# Patient Record
Sex: Male | Born: 1995 | Race: White | Hispanic: No | Marital: Single | Smoking: Current every day smoker
Health system: Southern US, Community
[De-identification: ages and names within clinical notes are randomized; demographics above are authoritative.]

## PROBLEM LIST (undated history)

## (undated) DIAGNOSIS — F1910 Other psychoactive substance abuse, uncomplicated: Secondary | ICD-10-CM

---

## 2016-07-09 ENCOUNTER — Inpatient Hospital Stay (HOSPITAL_COMMUNITY): Payer: BLUE CROSS/BLUE SHIELD

## 2016-07-09 ENCOUNTER — Encounter (HOSPITAL_COMMUNITY): Payer: Self-pay

## 2016-07-09 ENCOUNTER — Inpatient Hospital Stay (HOSPITAL_COMMUNITY)
Admission: AD | Admit: 2016-07-09 | Discharge: 2016-08-06 | DRG: 917 | Disposition: A | Discharge status: Home / Self Care | Payer: BLUE CROSS/BLUE SHIELD | Source: Other Acute Inpatient Hospital | Attending: Family Medicine | Admitting: Family Medicine

## 2016-07-09 DIAGNOSIS — M6282 Rhabdomyolysis: Secondary | ICD-10-CM

## 2016-07-09 DIAGNOSIS — J690 Pneumonitis due to inhalation of food and vomit: Secondary | ICD-10-CM | POA: Diagnosis present

## 2016-07-09 DIAGNOSIS — X58XXXA Exposure to other specified factors, initial encounter: Secondary | ICD-10-CM | POA: Diagnosis not present

## 2016-07-09 DIAGNOSIS — T404X1A Poisoning by other synthetic narcotics, accidental (unintentional), initial encounter: Secondary | ICD-10-CM | POA: Diagnosis present

## 2016-07-09 DIAGNOSIS — S3730XA Unspecified injury of urethra, initial encounter: Secondary | ICD-10-CM | POA: Diagnosis not present

## 2016-07-09 DIAGNOSIS — J9601 Acute respiratory failure with hypoxia: Secondary | ICD-10-CM | POA: Diagnosis present

## 2016-07-09 DIAGNOSIS — I959 Hypotension, unspecified: Secondary | ICD-10-CM | POA: Diagnosis not present

## 2016-07-09 DIAGNOSIS — T404X2A Poisoning by other synthetic narcotics, intentional self-harm, initial encounter: Secondary | ICD-10-CM | POA: Diagnosis present

## 2016-07-09 DIAGNOSIS — T40601A Poisoning by unspecified narcotics, accidental (unintentional), initial encounter: Secondary | ICD-10-CM | POA: Diagnosis present

## 2016-07-09 DIAGNOSIS — D649 Anemia, unspecified: Secondary | ICD-10-CM | POA: Diagnosis present

## 2016-07-09 DIAGNOSIS — N368 Other specified disorders of urethra: Secondary | ICD-10-CM | POA: Diagnosis not present

## 2016-07-09 DIAGNOSIS — T50904S Poisoning by unspecified drugs, medicaments and biological substances, undetermined, sequela: Secondary | ICD-10-CM | POA: Diagnosis not present

## 2016-07-09 DIAGNOSIS — N170 Acute kidney failure with tubular necrosis: Secondary | ICD-10-CM | POA: Diagnosis present

## 2016-07-09 DIAGNOSIS — T1491XA Suicide attempt, initial encounter: Secondary | ICD-10-CM | POA: Diagnosis not present

## 2016-07-09 DIAGNOSIS — J9600 Acute respiratory failure, unspecified whether with hypoxia or hypercapnia: Secondary | ICD-10-CM | POA: Diagnosis not present

## 2016-07-09 DIAGNOSIS — E162 Hypoglycemia, unspecified: Secondary | ICD-10-CM | POA: Diagnosis present

## 2016-07-09 DIAGNOSIS — I420 Dilated cardiomyopathy: Secondary | ICD-10-CM | POA: Diagnosis present

## 2016-07-09 DIAGNOSIS — I361 Nonrheumatic tricuspid (valve) insufficiency: Secondary | ICD-10-CM | POA: Diagnosis not present

## 2016-07-09 DIAGNOSIS — E875 Hyperkalemia: Secondary | ICD-10-CM | POA: Diagnosis present

## 2016-07-09 DIAGNOSIS — R310 Gross hematuria: Secondary | ICD-10-CM | POA: Diagnosis present

## 2016-07-09 DIAGNOSIS — T8249XA Other complication of vascular dialysis catheter, initial encounter: Secondary | ICD-10-CM

## 2016-07-09 DIAGNOSIS — T401X1A Poisoning by heroin, accidental (unintentional), initial encounter: Secondary | ICD-10-CM | POA: Diagnosis present

## 2016-07-09 DIAGNOSIS — R740 Nonspecific elevation of levels of transaminase and lactic acid dehydrogenase [LDH]: Secondary | ICD-10-CM | POA: Diagnosis present

## 2016-07-09 DIAGNOSIS — T50901A Poisoning by unspecified drugs, medicaments and biological substances, accidental (unintentional), initial encounter: Secondary | ICD-10-CM | POA: Diagnosis present

## 2016-07-09 DIAGNOSIS — K7200 Acute and subacute hepatic failure without coma: Secondary | ICD-10-CM | POA: Diagnosis present

## 2016-07-09 DIAGNOSIS — N179 Acute kidney failure, unspecified: Secondary | ICD-10-CM

## 2016-07-09 DIAGNOSIS — F11221 Opioid dependence with intoxication delirium: Secondary | ICD-10-CM | POA: Diagnosis not present

## 2016-07-09 DIAGNOSIS — R748 Abnormal levels of other serum enzymes: Secondary | ICD-10-CM | POA: Diagnosis not present

## 2016-07-09 DIAGNOSIS — E669 Obesity, unspecified: Secondary | ICD-10-CM | POA: Diagnosis present

## 2016-07-09 DIAGNOSIS — G9340 Encephalopathy, unspecified: Secondary | ICD-10-CM | POA: Diagnosis present

## 2016-07-09 DIAGNOSIS — D696 Thrombocytopenia, unspecified: Secondary | ICD-10-CM | POA: Diagnosis present

## 2016-07-09 DIAGNOSIS — E872 Acidosis: Secondary | ICD-10-CM | POA: Diagnosis present

## 2016-07-09 DIAGNOSIS — N3289 Other specified disorders of bladder: Secondary | ICD-10-CM | POA: Diagnosis not present

## 2016-07-09 DIAGNOSIS — E861 Hypovolemia: Secondary | ICD-10-CM | POA: Diagnosis present

## 2016-07-09 DIAGNOSIS — T50901D Poisoning by unspecified drugs, medicaments and biological substances, accidental (unintentional), subsequent encounter: Secondary | ICD-10-CM | POA: Diagnosis not present

## 2016-07-09 DIAGNOSIS — T50902A Poisoning by unspecified drugs, medicaments and biological substances, intentional self-harm, initial encounter: Secondary | ICD-10-CM | POA: Diagnosis not present

## 2016-07-09 DIAGNOSIS — T50904A Poisoning by unspecified drugs, medicaments and biological substances, undetermined, initial encounter: Secondary | ICD-10-CM

## 2016-07-09 DIAGNOSIS — Z452 Encounter for adjustment and management of vascular access device: Secondary | ICD-10-CM

## 2016-07-09 DIAGNOSIS — I248 Other forms of acute ischemic heart disease: Secondary | ICD-10-CM | POA: Diagnosis present

## 2016-07-09 DIAGNOSIS — Z4659 Encounter for fitting and adjustment of other gastrointestinal appliance and device: Secondary | ICD-10-CM

## 2016-07-09 DIAGNOSIS — J9690 Respiratory failure, unspecified, unspecified whether with hypoxia or hypercapnia: Secondary | ICD-10-CM

## 2016-07-09 DIAGNOSIS — I519 Heart disease, unspecified: Secondary | ICD-10-CM | POA: Diagnosis not present

## 2016-07-09 DIAGNOSIS — D62 Acute posthemorrhagic anemia: Secondary | ICD-10-CM | POA: Diagnosis not present

## 2016-07-09 DIAGNOSIS — I5030 Unspecified diastolic (congestive) heart failure: Secondary | ICD-10-CM | POA: Diagnosis not present

## 2016-07-09 DIAGNOSIS — F17210 Nicotine dependence, cigarettes, uncomplicated: Secondary | ICD-10-CM | POA: Diagnosis not present

## 2016-07-09 HISTORY — DX: Other psychoactive substance abuse, uncomplicated: F19.10

## 2016-07-09 LAB — GLUCOSE, CAPILLARY
GLUCOSE-CAPILLARY: 159 mg/dL — AB (ref 65–99)
Glucose-Capillary: 103 mg/dL — ABNORMAL HIGH (ref 65–99)
Glucose-Capillary: 127 mg/dL — ABNORMAL HIGH (ref 65–99)
Glucose-Capillary: 48 mg/dL — ABNORMAL LOW (ref 65–99)
Glucose-Capillary: 67 mg/dL (ref 65–99)

## 2016-07-09 LAB — BLOOD GAS, ARTERIAL
Acid-base deficit: 4.7 mmol/L — ABNORMAL HIGH (ref 0.0–2.0)
Bicarbonate: 20.8 mmol/L (ref 20.0–28.0)
DRAWN BY: 235881
FIO2: 60
O2 Saturation: 97.8 %
PEEP: 5 cmH2O
Patient temperature: 99.1
RATE: 18 resp/min
VT: 620 mL
pCO2 arterial: 45.1 mmHg (ref 32.0–48.0)
pH, Arterial: 7.287 — ABNORMAL LOW (ref 7.350–7.450)
pO2, Arterial: 132 mmHg — ABNORMAL HIGH (ref 83.0–108.0)

## 2016-07-09 LAB — COMPREHENSIVE METABOLIC PANEL
ALT: 4003 U/L — ABNORMAL HIGH (ref 17–63)
ANION GAP: 10 (ref 5–15)
AST: 5359 U/L — ABNORMAL HIGH (ref 15–41)
Albumin: 3.4 g/dL — ABNORMAL LOW (ref 3.5–5.0)
Alkaline Phosphatase: 62 U/L (ref 38–126)
BUN: 28 mg/dL — ABNORMAL HIGH (ref 6–20)
CO2: 22 mmol/L (ref 22–32)
Calcium: 7.5 mg/dL — ABNORMAL LOW (ref 8.9–10.3)
Chloride: 105 mmol/L (ref 101–111)
Creatinine, Ser: 4.27 mg/dL — ABNORMAL HIGH (ref 0.61–1.24)
GFR calc non Af Amer: 19 mL/min — ABNORMAL LOW (ref >60)
GFR, EST AFRICAN AMERICAN: 21 mL/min — AB (ref >60)
Glucose, Bld: 154 mg/dL — ABNORMAL HIGH (ref 65–99)
POTASSIUM: 6.8 mmol/L — AB (ref 3.5–5.1)
SODIUM: 137 mmol/L (ref 135–145)
TOTAL PROTEIN: 6.6 g/dL (ref 6.5–8.1)
Total Bilirubin: 1.2 mg/dL (ref 0.3–1.2)

## 2016-07-09 LAB — ECHOCARDIOGRAM COMPLETE
AOPV: 0.71 m/s
AV Area VTI: 2.47 cm2
AV Peak grad: 3 mmHg
AVPKVEL: 92.7 cm/s
Area-P 1/2: 3.33 cm2
CHL CUP AV PEAK INDEX: 1.07
CHL CUP DOP CALC LVOT VTI: 10 cm
E decel time: 225 msec
E/e' ratio: 5.96
FS: 17 % — AB (ref 28–44)
Height: 72 in
IVS/LV PW RATIO, ED: 1.23
LA diam end sys: 31 mm
LA diam index: 1.34 cm/m2
LA vol A4C: 20.3 ml
LASIZE: 31 mm
LDCA: 3.46 cm2
LV PW d: 9.72 mm — AB (ref 0.6–1.1)
LV SIMPSON'S DISK: 44
LV dias vol: 128 mL (ref 62–150)
LVDIAVOLIN: 55 mL/m2
LVEEAVG: 5.96
LVEEMED: 5.96
LVELAT: 9.25 cm/s
LVOT diameter: 21 mm
LVOTPV: 66.1 cm/s
LVOTSV: 35 mL
LVSYSVOL: 72 mL — AB (ref 21–61)
LVSYSVOLIN: 31 mL/m2
MV Dec: 225
MV pk A vel: 38.4 m/s
MV pk E vel: 55.1 m/s
P 1/2 time: 66 ms
RV LATERAL S' VELOCITY: 9.14 cm/s
RV TAPSE: 12.1 mm
RV sys press: 23 mmHg
Reg peak vel: 226 cm/s
Stroke v: 56 ml
TDI e' lateral: 9.25
TDI e' medial: 5.55
TRMAXVEL: 226 cm/s

## 2016-07-09 LAB — POCT ACTIVATED CLOTTING TIME
ACTIVATED CLOTTING TIME: 142 s
ACTIVATED CLOTTING TIME: 142 s
ACTIVATED CLOTTING TIME: 142 s
Activated Clotting Time: 120 seconds
Activated Clotting Time: 120 seconds

## 2016-07-09 LAB — PHOSPHORUS: Phosphorus: 3.9 mg/dL (ref 2.5–4.6)

## 2016-07-09 LAB — TROPONIN I
TROPONIN I: 16.84 ng/mL — AB (ref <0.03)
TROPONIN I: 24.1 ng/mL — AB (ref <0.03)

## 2016-07-09 LAB — CBC WITH DIFFERENTIAL/PLATELET
BASOS ABS: 0 10*3/uL (ref 0.0–0.1)
Basophils Relative: 0 %
EOS PCT: 0 %
Eosinophils Absolute: 0 10*3/uL (ref 0.0–0.7)
HEMATOCRIT: 46.7 % (ref 39.0–52.0)
Hemoglobin: 15.7 g/dL (ref 13.0–17.0)
LYMPHS PCT: 5 %
Lymphs Abs: 0.6 10*3/uL — ABNORMAL LOW (ref 0.7–4.0)
MCH: 30.1 pg (ref 26.0–34.0)
MCHC: 33.6 g/dL (ref 30.0–36.0)
MCV: 89.5 fL (ref 78.0–100.0)
MONO ABS: 0.7 10*3/uL (ref 0.1–1.0)
MONOS PCT: 5 %
Neutro Abs: 12.3 10*3/uL — ABNORMAL HIGH (ref 1.7–7.7)
Neutrophils Relative %: 90 %
PLATELETS: 152 10*3/uL (ref 150–400)
RBC: 5.22 MIL/uL (ref 4.22–5.81)
RDW: 14.1 % (ref 11.5–15.5)
WBC: 13.6 10*3/uL — ABNORMAL HIGH (ref 4.0–10.5)

## 2016-07-09 LAB — LACTIC ACID, PLASMA
Lactic Acid, Venous: 2.2 mmol/L (ref 0.5–1.9)
Lactic Acid, Venous: 1.8 mmol/L (ref 0.5–1.9)

## 2016-07-09 LAB — CK: Total CK: >50000 U/L — ABNORMAL HIGH (ref 49–397)

## 2016-07-09 LAB — RENAL FUNCTION PANEL
ALBUMIN: 3.3 g/dL — AB (ref 3.5–5.0)
Anion gap: 11 (ref 5–15)
BUN: 30 mg/dL — ABNORMAL HIGH (ref 6–20)
CALCIUM: 7.7 mg/dL — AB (ref 8.9–10.3)
CO2: 22 mmol/L (ref 22–32)
CREATININE: 4.56 mg/dL — AB (ref 0.61–1.24)
Chloride: 106 mmol/L (ref 101–111)
GFR, EST AFRICAN AMERICAN: 20 mL/min — AB (ref >60)
GFR, EST NON AFRICAN AMERICAN: 17 mL/min — AB (ref >60)
Glucose, Bld: 135 mg/dL — ABNORMAL HIGH (ref 65–99)
PHOSPHORUS: 4.5 mg/dL (ref 2.5–4.6)
Potassium: 4.4 mmol/L (ref 3.5–5.1)
SODIUM: 139 mmol/L (ref 135–145)

## 2016-07-09 LAB — MAGNESIUM: MAGNESIUM: 2.1 mg/dL (ref 1.7–2.4)

## 2016-07-09 LAB — MRSA PCR SCREENING: MRSA BY PCR: NEGATIVE

## 2016-07-09 LAB — HIV ANTIBODY (ROUTINE TESTING W REFLEX): HIV Screen 4th Generation wRfx: NONREACTIVE

## 2016-07-09 LAB — PROCALCITONIN: PROCALCITONIN: 41.54 ng/mL

## 2016-07-09 LAB — TRIGLYCERIDES: Triglycerides: 149 mg/dL (ref <150)

## 2016-07-09 MED ORDER — HEPARIN BOLUS VIA INFUSION (CRRT)
1000.0000 [IU] | INTRAVENOUS | Status: DC | PRN
  Administered 2016-07-09 (×8): 1000 [IU] via INTRAVENOUS_CENTRAL
  Filled 2016-07-09 (×2): qty 1000

## 2016-07-09 MED ORDER — DEXTROSE 50 % IV SOLN
50.0000 mL | Freq: Once | INTRAVENOUS | Status: AC
  Administered 2016-07-09: 50 mL via INTRAVENOUS
  Filled 2016-07-09: qty 50

## 2016-07-09 MED ORDER — PRISMASOL BGK 0/2.5 32-2.5 MEQ/L IV SOLN
INTRAVENOUS | Status: DC
  Administered 2016-07-09 (×2): via INTRAVENOUS_CENTRAL
  Filled 2016-07-09 (×7): qty 5000

## 2016-07-09 MED ORDER — SODIUM POLYSTYRENE SULFONATE 15 GM/60ML PO SUSP
30.0000 g | Freq: Once | ORAL | Status: AC
  Administered 2016-07-09: 30 g
  Filled 2016-07-09: qty 120

## 2016-07-09 MED ORDER — HEPARIN SODIUM (PORCINE) 5000 UNIT/ML IJ SOLN
5000.0000 [IU] | Freq: Three times a day (TID) | INTRAMUSCULAR | Status: DC
  Administered 2016-07-09 – 2016-07-20 (×33): 5000 [IU] via SUBCUTANEOUS
  Filled 2016-07-09 (×35): qty 1

## 2016-07-09 MED ORDER — SODIUM CHLORIDE 0.9% FLUSH: 10.0000 mL | INTRAVENOUS | Status: DC | PRN

## 2016-07-09 MED ORDER — PANTOPRAZOLE SODIUM 40 MG IV SOLR
40.0000 mg | Freq: Every day | INTRAVENOUS | Status: DC
  Administered 2016-07-09 – 2016-07-11 (×3): 40 mg via INTRAVENOUS
  Filled 2016-07-09 (×3): qty 40

## 2016-07-09 MED ORDER — ORAL CARE MOUTH RINSE
15.0000 mL | OROMUCOSAL | Status: DC
  Administered 2016-07-09 – 2016-07-12 (×35): 15 mL via OROMUCOSAL

## 2016-07-09 MED ORDER — SODIUM CHLORIDE 0.9% FLUSH
10.0000 mL | Freq: Two times a day (BID) | INTRAVENOUS | Status: DC
  Administered 2016-07-09: 10 mL
  Administered 2016-07-10: 20 mL
  Administered 2016-07-10: 10 mL
  Administered 2016-07-11: 20 mL
  Administered 2016-07-12: 10 mL
  Administered 2016-07-13: 40 mL
  Administered 2016-07-14 – 2016-07-25 (×14): 10 mL
  Administered 2016-07-26: 40 mL
  Administered 2016-07-26 – 2016-07-30 (×4): 10 mL

## 2016-07-09 MED ORDER — FENTANYL CITRATE (PF) 100 MCG/2ML IJ SOLN
100.0000 ug | INTRAMUSCULAR | Status: AC | PRN
  Administered 2016-07-10 – 2016-07-11 (×3): 100 ug via INTRAVENOUS
  Filled 2016-07-09 (×3): qty 2

## 2016-07-09 MED ORDER — CHLORHEXIDINE GLUCONATE 0.12% ORAL RINSE (MEDLINE KIT)
15.0000 mL | Freq: Two times a day (BID) | OROMUCOSAL | Status: DC
Start: 2016-07-09 — End: 2016-07-12
  Administered 2016-07-09 – 2016-07-12 (×8): 15 mL via OROMUCOSAL

## 2016-07-09 MED ORDER — HEPARIN (PORCINE) 2000 UNITS/L FOR CRRT
INTRAVENOUS_CENTRAL | Status: DC | PRN
  Administered 2016-07-09: 12:00:00 via INTRAVENOUS_CENTRAL
  Administered 2016-07-10: 999 mL via INTRAVENOUS_CENTRAL
  Administered 2016-07-13 – 2016-07-14 (×2): via INTRAVENOUS_CENTRAL
  Filled 2016-07-09: qty 1000

## 2016-07-09 MED ORDER — SODIUM CHLORIDE 0.9 % IV SOLN
1.0000 g | Freq: Once | INTRAVENOUS | Status: AC
  Administered 2016-07-09: 1 g via INTRAVENOUS
  Filled 2016-07-09: qty 10

## 2016-07-09 MED ORDER — SODIUM CHLORIDE 0.9 % IV SOLN
250.0000 mL | INTRAVENOUS | Status: DC | PRN
  Administered 2016-07-09 – 2016-07-12 (×3): 250 mL via INTRAVENOUS

## 2016-07-09 MED ORDER — CHLORHEXIDINE GLUCONATE CLOTH 2 % EX PADS
6.0000 | MEDICATED_PAD | Freq: Every day | CUTANEOUS | Status: DC
  Administered 2016-07-09 – 2016-07-21 (×12): 6 via TOPICAL

## 2016-07-09 MED ORDER — INSULIN ASPART 100 UNIT/ML ~~LOC~~ SOLN
10.0000 [IU] | Freq: Once | SUBCUTANEOUS | Status: AC
  Administered 2016-07-09: 10 [IU] via INTRAVENOUS

## 2016-07-09 MED ORDER — DEXTROSE 50 % IV SOLN
INTRAVENOUS | Status: AC
  Administered 2016-07-09: 50 mL
  Filled 2016-07-09: qty 50

## 2016-07-09 MED ORDER — HEPARIN SODIUM (PORCINE) 1000 UNIT/ML DIALYSIS
1000.0000 [IU] | INTRAMUSCULAR | Status: DC | PRN
  Administered 2016-07-13 – 2016-07-16 (×2): 2800 [IU] via INTRAVENOUS_CENTRAL
  Filled 2016-07-09 (×4): qty 6
  Filled 2016-07-09 (×2): qty 3

## 2016-07-09 MED ORDER — PRISMASOL BGK 4/2.5 32-4-2.5 MEQ/L IV SOLN
INTRAVENOUS | Status: DC
  Administered 2016-07-09 – 2016-07-16 (×13): via INTRAVENOUS_CENTRAL
  Filled 2016-07-09 (×20): qty 5000

## 2016-07-09 MED ORDER — FENTANYL CITRATE (PF) 100 MCG/2ML IJ SOLN
100.0000 ug | INTRAMUSCULAR | Status: DC | PRN
  Administered 2016-07-09 – 2016-07-12 (×8): 100 ug via INTRAVENOUS
  Filled 2016-07-09 (×8): qty 2

## 2016-07-09 MED ORDER — PRISMASOL BGK 4/2.5 32-4-2.5 MEQ/L IV SOLN
INTRAVENOUS | Status: DC
  Administered 2016-07-09 – 2016-07-16 (×52): via INTRAVENOUS_CENTRAL
  Filled 2016-07-09 (×66): qty 5000

## 2016-07-09 MED ORDER — SODIUM CHLORIDE 0.9 % IJ SOLN
250.0000 [IU]/h | INTRAMUSCULAR | Status: DC
  Administered 2016-07-09: 250 [IU]/h via INTRAVENOUS_CENTRAL
  Administered 2016-07-09: 1450 [IU]/h via INTRAVENOUS_CENTRAL
  Administered 2016-07-10: 2050 [IU]/h via INTRAVENOUS_CENTRAL
  Administered 2016-07-10: 2250 [IU]/h via INTRAVENOUS_CENTRAL
  Administered 2016-07-10 (×2): 2350 [IU]/h via INTRAVENOUS_CENTRAL
  Administered 2016-07-10: 2150 [IU]/h via INTRAVENOUS_CENTRAL
  Administered 2016-07-10: 1950 [IU]/h via INTRAVENOUS_CENTRAL
  Administered 2016-07-10: 2350 [IU]/h via INTRAVENOUS_CENTRAL
  Administered 2016-07-11 (×2): 1950 [IU]/h via INTRAVENOUS_CENTRAL
  Administered 2016-07-11: 1900 [IU]/h via INTRAVENOUS_CENTRAL
  Administered 2016-07-11: 1850 [IU]/h via INTRAVENOUS_CENTRAL
  Administered 2016-07-11: 1950 [IU]/h via INTRAVENOUS_CENTRAL
  Administered 2016-07-11: 1850 [IU]/h via INTRAVENOUS_CENTRAL
  Administered 2016-07-12 (×5): 2000 [IU]/h via INTRAVENOUS_CENTRAL
  Administered 2016-07-13: 1750 [IU]/h via INTRAVENOUS_CENTRAL
  Administered 2016-07-13: 1900 [IU]/h via INTRAVENOUS_CENTRAL
  Administered 2016-07-14 (×3): 1800 [IU]/h via INTRAVENOUS_CENTRAL
  Administered 2016-07-14: 1650 [IU]/h via INTRAVENOUS_CENTRAL
  Administered 2016-07-15: 1350 [IU]/h via INTRAVENOUS_CENTRAL
  Administered 2016-07-15: 1300 [IU]/h via INTRAVENOUS_CENTRAL
  Administered 2016-07-15: 1450 [IU]/h via INTRAVENOUS_CENTRAL
  Administered 2016-07-15: 1300 [IU]/h via INTRAVENOUS_CENTRAL
  Administered 2016-07-16 (×3): 1450 [IU]/h via INTRAVENOUS_CENTRAL
  Filled 2016-07-09 (×39): qty 2

## 2016-07-09 MED ORDER — DEXTROSE 50 % IV SOLN
1.0000 | Freq: Once | INTRAVENOUS | Status: AC
  Administered 2016-07-09: 50 mL via INTRAVENOUS
  Filled 2016-07-09: qty 50

## 2016-07-09 MED ORDER — DEXTROSE 50 % IV SOLN
INTRAVENOUS | Status: AC
  Filled 2016-07-09: qty 50

## 2016-07-09 MED ORDER — DEXTROSE 50 % IV SOLN
INTRAVENOUS | Status: AC
  Administered 2016-07-09: 50 mL via INTRAVENOUS
  Filled 2016-07-09: qty 50

## 2016-07-09 MED ORDER — PROPOFOL 1000 MG/100ML IV EMUL
0.0000 ug/kg/min | INTRAVENOUS | Status: DC
  Administered 2016-07-09 (×3): 30 ug/kg/min via INTRAVENOUS
  Administered 2016-07-10: 50 ug/kg/min via INTRAVENOUS
  Administered 2016-07-10: 20 ug/kg/min via INTRAVENOUS
  Administered 2016-07-10: 25 ug/kg/min via INTRAVENOUS
  Administered 2016-07-10: 45 ug/kg/min via INTRAVENOUS
  Administered 2016-07-10: 20 ug/kg/min via INTRAVENOUS
  Administered 2016-07-11 (×2): 30 ug/kg/min via INTRAVENOUS
  Administered 2016-07-11: 25.046 ug/kg/min via INTRAVENOUS
  Administered 2016-07-11: 40 ug/kg/min via INTRAVENOUS
  Administered 2016-07-11: 25.046 ug/kg/min via INTRAVENOUS
  Administered 2016-07-12: 40 ug/kg/min via INTRAVENOUS
  Administered 2016-07-12: 30.055 ug/kg/min via INTRAVENOUS
  Administered 2016-07-12: 20.036 ug/kg/min via INTRAVENOUS
  Filled 2016-07-09 (×18): qty 100

## 2016-07-09 MED ORDER — SODIUM CHLORIDE 0.9 % IV SOLN
INTRAVENOUS | Status: DC
  Administered 2016-07-09 – 2016-07-10 (×4): via INTRAVENOUS
  Administered 2016-07-10: 150 mL/h via INTRAVENOUS
  Administered 2016-07-11 – 2016-07-16 (×4): via INTRAVENOUS

## 2016-07-09 MED ORDER — PRISMASOL BGK 4/2.5 32-4-2.5 MEQ/L IV SOLN
INTRAVENOUS | Status: DC
  Administered 2016-07-09 – 2016-07-16 (×17): via INTRAVENOUS_CENTRAL
  Filled 2016-07-09 (×21): qty 5000

## 2016-07-09 MED ORDER — PROPOFOL 1000 MG/100ML IV EMUL
0.0000 ug/kg/min | INTRAVENOUS | Status: DC
  Administered 2016-07-09: 30 ug/kg/min via INTRAVENOUS

## 2016-07-09 NOTE — Progress Notes (Signed)
  Echocardiogram 2D Echocardiogram has been performed.  Blake Kramer 07/09/2016, 9:40 AM

## 2016-07-09 NOTE — Consult Note (Signed)
Cardiology Consult    Patient ID: Blake Kramer MRN: [id number], DOB/AGE: Jan 07, 1996   Admit date: 07/09/2016 Date of Consult: 07/09/2016  Primary Physician: No primary care provider on file. Primary Cardiologist: new - Dr. Percival Spanish Requesting Provider: Dr. Chase Caller  Reason for Consult: elevated troponin  Patient Profile    [name] has no listed PMH in EPIC. Cardiology was consulted for elevated troponin in this patient who was found unresponsive from likely opioid overdose.  Blake Kramer is a 21 y.o. male who is being seen today for the evaluation of elevated troponin at the request of Dr. Chase Caller.    Past Medical History   No past medical history on file.  No past surgical history on file.  (The patient's family does not report a significant past medical or surgical history.)  Allergies  Allergies  Allergen Reactions  . Penicillins     History of Present Illness    Pt is intubated and encephalopathic. Level 5 Caveat.  [name] is a 21 yo male who was found unresponsive, pale, and diaphoretic after a presumed drug overdose; thought to be down and unresponsive for 10-12 hrs.. His UDS was positive for opioids. In the ED, he was intubated for airway protection. Narcan x 3 given with little response. He was transferred to [facility] and found to have multiple metabolic disturbances (see Assessment and Plan), including elevated troponin. Cardiology was asked to consult for elevated troponin. Echo also revealed new LVEF of 25-30%.  Mother and sister at bedside report no medical issues other than substance abuse/addiction.   Inpatient Medications    . chlorhexidine gluconate (MEDLINE KIT)  15 mL Mouth Rinse BID  . Chlorhexidine Gluconate Cloth  6 each Topical Daily  . heparin  5,000 Units Subcutaneous Q8H  . mouth rinse  15 mL Mouth Rinse 10 times per day  . pantoprazole (PROTONIX) IV  40 mg Intravenous QHS  . sodium chloride flush  10-40 mL Intracatheter Q12H     Outpatient  Medications    Prior to Admission medications   Not on File     Family History    Level 5 Caveat for non-responsive patient.  No family history on file.  Social History    Social History   Social History  . Marital status: Single    Spouse name: N/A  . Number of children: N/A  . Years of education: N/A   Occupational History  . Not on file.   Social History Main Topics  . Smoking status: Not on file  . Smokeless tobacco: Not on file  . Alcohol use Not on file  . Drug use: Unknown  . Sexual activity: Not on file   Other Topics Concern  . Not on file   Social History Narrative  . No narrative on file     Review of Systems    Unable to obtain.  Patient is intubated and sedated.   Physical Exam    Blood pressure (!) 145/106, pulse 93, temperature 98.5 F (36.9 C), temperature source Oral, resp. rate 18, height 6' (1.829 m), weight 242 lb 1 oz (109.8 kg), SpO2 99 %.  General: intubated  Psych: unresponsive Neuro: unresponsive HEENT: Normal  Neck: Supple without bruits or JVD. Lungs:  Intubated, lung sounds bilaterally Heart: regular rate and rhythm, exam difficult 2/ breath sounds Abdomen: Soft, non-tender, non-distended, BS + x 4.  Extremities: No clubbing, cyanosis or edema. DP/PT/Radials 2+ and equal bilaterally.  Labs    Troponin [facility] of Care Test)  No results for input(s): TROPIPOC in the last 72 hours.  Recent Labs  07/09/16 0755  CKTOTAL >50,000*  TROPONINI 16.84*   Lab Results  Component Value Date   WBC 13.6 (H) 07/09/2016   HGB 15.7 07/09/2016   HCT 46.7 07/09/2016   MCV 89.5 07/09/2016   PLT 152 07/09/2016    Recent Labs Lab 07/09/16 0755  NA 137  K 6.8*  CL 105  CO2 22  BUN 28*  CREATININE 4.27*  CALCIUM 7.5*  PROT 6.6  BILITOT 1.2  ALKPHOS 62  ALT 4,003*  AST 5,359*  GLUCOSE 154*   Lab Results  Component Value Date   TRIG 149 07/09/2016   No results found for: [facility]   Radiology Studies    US  Renal  Result Date: 07/09/2016 CLINICAL DATA:  Acute kidney injury EXAM: RENAL / URINARY TRACT ULTRASOUND COMPLETE COMPARISON:  None. FINDINGS: Right Kidney: Length: 12.8 cm. Increased renal cortical echogenicity. No mass or hydronephrosis visualized. Left Kidney: Length: 12.9 cm. Increased renal cortical echogenicity. No mass or hydronephrosis visualized. Bladder: Decompressed bladder with a Foley catheter present. IMPRESSION: 1. No obstructive uropathy. 2. Increased renal cortical echogenicity as can be seen with acute renal failure. Electronically Signed   By: Kathreen Devoid   On: 07/09/2016 12:03   Dg Chest Port 1 View  Result Date: 07/09/2016 CLINICAL DATA:  Status post left catheter placement. EXAM: PORTABLE CHEST 1 VIEW COMPARISON:  Radiograph of July 09, 2016. FINDINGS: Stable cardiomediastinal silhouette. Endotracheal and orogastric tubes are unchanged in position. Right internal jugular catheter is unchanged. Mild bibasilar subsegmental atelectasis is now noted. Interval placement of left internal jugular dialysis catheter with distal tip in expected position of cavoatrial junction. No pneumothorax or pleural effusion is noted. IMPRESSION: Mild bibasilar subsegmental atelectasis. Interval placement of left internal jugular dialysis catheter with distal tip in expected position of cavoatrial junction. No pneumothorax is noted. Electronically Signed   By: Marijo Conception, M.D.   On: 07/09/2016 12:28   Dg Chest Port 1 View  Result Date: 07/09/2016 CLINICAL DATA:  Endotracheal tube and central line placement. EXAM: PORTABLE CHEST 1 VIEW COMPARISON:  None. FINDINGS: The heart size and mediastinal contours are within normal limits. No pneumothorax or pleural effusion is noted. Minimal right basilar subsegmental atelectasis is noted. Right internal jugular catheter is noted with distal tip in expected position of the SVC. Endotracheal tube is projected over tracheal air shadow with distal tip 6 cm above the  carina. Orogastric tube tip is seen in proximal stomach. The visualized skeletal structures are unremarkable. IMPRESSION: Endotracheal tube, orogastric tube and right internal jugular catheter in grossly good position. No pneumothorax is noted. Minimal right basilar subsegmental atelectasis. Electronically Signed   By: Marijo Conception, M.D.   On: 07/09/2016 08:44    ECG & Cardiac Imaging    EKG 07/09/16: NSR  Echocardiogram 07/09/16: Study Conclusions - Left ventricle: The cavity size was moderately dilated. Wall   thickness was increased in a pattern of mild LVH. Systolic   function was severely reduced. The estimated ejection fraction   was in the range of 25% to 30%. Diffuse hypokinesis. Left   ventricular diastolic function parameters were normal. - Aortic valve: Valve area (Vmax): 2.47 cm^2. - Atrial septum: No defect or patent foramen ovale was identified.   Assessment & Plan    1. Elevated troponin - troponin 16.84, without ischemic changes on EKG - this may be related to rhabdomyolysis vs ischemic process  2. Acute systolic heart failure - no previous echo available for comparison - LVEF 25-30% - patient does not appear to be volume overloaded on exam   3. Suspected drug overdose, thought to be unresponsive for 10-12 hrs - per primary team, pt intubated and unresponsive   Patient has multiple metabolic derangements. Lactic acid 2.2, procalcitonin 41.54, CK > 50000, ABG with pH 7.287, AST 5359, ALT 4003, K 7.1, now 6.8. Elevated troponin may be related to his immobilization for a suspected 10-12 hrs vs demand ischemia vs ACS. We will defer ischemic evaluation at this time. If patient's labs normalize and he improves neurologically, will re-evaluate for ischemia.    Signed, Tami Lin Duke, PA-C 07/09/2016, 1:33 PM [phone]   History and all data above reviewed.  Patient examined. The patient is unresponsive and intubated.  His family gives the history and I agree  with the details as above.  He has had no past cardiac history and no recent complaints.  He is active without chest pain or SOB.  He did have recent skin lesions that required debridement.  However, there has been no history of fevers or chills.  I agree with the findings as above.  The patient exam reveals COR:RRR, no rubs or murmurs  ,  Lungs: clear  ,  Abd: Decreased bowel sounds, Ext No edema  .  All available labs, radiology testing, previous records reviewed. Agree with documented assessment and plan. ELEVATED TROPONIN:  I suspect this is secondary to the acute process that has led to his unresponsive epidsode rather than the cause of this episode.  He does not have any acute injury pattern on the EKG.  The echo is diffuse hypokinesis and not regional.  Despite the low EF he does not have cardiogenic shock.  There is no indicated for acute invasive evaluation.  He needs continued support with dialysis and ventilation.   As his BP allows we can add afterload reduction with hydral/nitrates or perhaps beta blockers.  Of note, he has no lesions on his echo to suggest endocarditis and does not have any other clinical findings consistent with this diagnosis.  blood cultures have been drawn. We will follow.     Blake Kramer  3:04 PM  07/09/2016

## 2016-07-09 NOTE — Consult Note (Signed)
[facility] Consultation Note Requesting Physician:  CCM Reason for Consult:  Oliguric AKI, life threatening hyperkalemia following narcotic overdose w/rhabdo  HPI: The patient is a 21 y.o. year-old WM. PHM not known and is obtained from CCM and the records for [facility]. EMS was dispatched to a residence where pt was found lying in the hall unresponsive. Bystanders thought the OD was heroin and fentanyl.  Had some minimal response to narcan in the field, taken to the ED, remained altered, intubated;  labs notable for Hb 17, K 7.1, lactic acid 2 values (1 was 5, the other 10). PH 7.2 Bicarb 15, AST, ALT >1000, creatinine 4.4. CT brain negative.  Transported here after glucose/insulin/bicarb/3-4 liters fluids. On arrival pr intubated, BP 130's, urine in foley dark muddy brown minimal amounts, K 6.8, CK >50,000. We are asked to see.   Creatinine, Ser  Date/Time Value Ref Range Status  07/09/2016 07:55 AM 4.27 (H) 0.61 - 1.24 mg/dL Final    Past Medical History: No past medical history on file.  Past Surgical History: No past surgical history on file.  Family History: No family history on file.  Social History:  has no tobacco, alcohol, and drug history on file.  Allergies  Allergen Reactions  . Penicillins     Prior to Admission medications   Not on File    Inpatient medications: . chlorhexidine gluconate (MEDLINE KIT)  15 mL Mouth Rinse BID  . Chlorhexidine Gluconate Cloth  6 each Topical Daily  . heparin  5,000 Units Subcutaneous Q8H  . mouth rinse  15 mL Mouth Rinse 10 times per day  . pantoprazole (PROTONIX) IV  40 mg Intravenous QHS  . sodium chloride flush  10-40 mL Intracatheter Q12H  . sodium polystyrene  30 g Per Tube Once    Review of Systems Unobtainable  Physical Exam:  Blood pressure (!) 132/93, pulse 92, temperature 99.1 F (37.3 C), temperature source Oral, resp. rate 18, height 6' (1.829 m), weight 109.8 kg (242 lb 1 oz), SpO2 98 %.  Gen: Intubated  and sedated young WM Muscular, well developed Skin: small areas on forearms that could be c/w track marks, also scattered lesions LLL >RLE Neck: no JVD, no bruits  Chest: Clear Regular, no rub or gallop Abdomen: soft,no distension noted Ext: No edema. Very muscular Dialysis Access: IJ temp cath (CCM 6/8)   Recent Labs Lab 07/09/16 0755  NA 137  K 6.8*  CL 105  CO2 22  GLUCOSE 154*  BUN 28*  CREATININE 4.27*  CALCIUM 7.5*  PHOS 3.9    Recent Labs Lab 07/09/16 0755  AST 5,359*  ALT 4,003*  ALKPHOS 62  BILITOT 1.2  PROT 6.6  ALBUMIN 3.4*     Recent Labs Lab 07/09/16 0755  WBC 13.6*  NEUTROABS 12.3*  HGB 15.7  HCT 46.7  MCV 89.5  PLT 152    Recent Labs Lab 07/09/16 0755  TROPONINI 16.84*   Lab Results  Component Value Date   CKTOTAL >50,000 (H) 07/09/2016    Recent Labs Lab 07/09/16 0808  GLUCAP 159*   ABG    Component Value Date/Time   PHART 7.287 (L) 07/09/2016 0930   PCO2ART 45.1 07/09/2016 0930   PO2ART 132 (H) 07/09/2016 0930   HCO3 20.8 07/09/2016 0930   ACIDBASEDEF 4.7 (H) 07/09/2016 0930   O2SAT 97.8 07/09/2016 0930    Xrays/Other Studies: Dg Chest Port 1 View  Result Date: 07/09/2016 CLINICAL DATA:  Endotracheal tube and central line placement. EXAM:  PORTABLE CHEST 1 VIEW COMPARISON:  None. FINDINGS: The heart size and mediastinal contours are within normal limits. No pneumothorax or pleural effusion is noted. Minimal right basilar subsegmental atelectasis is noted. Right internal jugular catheter is noted with distal tip in expected position of the SVC. Endotracheal tube is projected over tracheal air shadow with distal tip 6 cm above the carina. Orogastric tube tip is seen in proximal stomach. The visualized skeletal structures are unremarkable. IMPRESSION: Endotracheal tube, orogastric tube and right internal jugular catheter in grossly good position. No pneumothorax is noted. Minimal right basilar subsegmental atelectasis.  Electronically Signed   By: Marijo Conception, M.D.   On: 07/09/2016 08:44    Background:  21 yo WM with unknown PMH or renal function, admitted with anuric AKI (creatinine 4.4), life threatening hyperkalemia (6.8), rhabdo (CPK >90,689), metabolic acidosis following drug OD thought to be heroin + fentanyl and we are asked to provide RRT.  Assessment/Recommendations  1. AKI with severe hyperkalemia - ATN 2/2 rhabdo. Minimal dirty brown urine output despite 4-5 L IVF's. CK>50,000. Likely established ATN at this point. Start CRRT (quickest and safest way to correct. Use 0K dialysate and once K <5.9 change to standard 4K fluids. Keep even to + at the discretion of CCM.   2. Hyperkalemia - see #1 3. Lactic acidosis 4. Drug OD  - felt likely fentanyl + heroin. 5. VDRF - management per [place],  MD [facility] [phone] pager 07/09/2016, 11:12 AM

## 2016-07-09 NOTE — H&P (Signed)
PULMONARY / CRITICAL CARE MEDICINE   Name: Blake Kramer MRN: [id number] DOB: [birth year]    ADMISSION DATE:  07/09/2016 CONSULTATION DATE:  07/09/16  REFERRING MD:  Blake Kramer  CHIEF COMPLAINT:  Overdose  HISTORY OF PRESENT ILLNESS:  Pt is encephelopathic; therefore, this HPI is obtained from chart review. Blake Kramer is a 21 y.o. man with no known PMH.  He was brought to Blake Kramer ICU 6/7 from Blake Kramer after presumed drug overdose.  EMS was dispatched to pt's location where they found him lying supine on the floor, pale, diaphoretic.  Girlfriend informed EMS that she thought pt had might have been down for roughly 10 hours when she previously saw him.  Per police, there had been several cases of fentanyl and heroine overdoses in the community and per their report, scene looked very similar; therefore, they were suspecting the same in this instance. He was given 3 rounds of narcan with little response.  In ED, he was intubated for airway protection.  Labs revealed multiple metabolic derangements including Lactate 10.4, SCr 4.4, K 7.1, AST 1453, ALT 1008, AG 23, WBC 18.9, Hgb 17.8, Trop 4.48.  UDS positive for opiates.  CT head negative.  He was then transferred to Blake Kramer for further management.  PAST MEDICAL HISTORY :  She  has no past medical history on file.  PAST SURGICAL HISTORY: She  has no past surgical history on file.  Allergies not on file  No current facility-administered medications on file prior to encounter.    No current outpatient prescriptions on file prior to encounter.    FAMILY HISTORY:  Her has no family status information on file.    SOCIAL HISTORY: She    REVIEW OF SYSTEMS:   Unable to obtain as pt is encephalopathic.  SUBJECTIVE:  On vent, unresponsive.  VITAL SIGNS: There were no vitals taken for this visit.  HEMODYNAMICS: PAP: ()/()   VENTILATOR SETTINGS:    INTAKE / OUTPUT: No intake/output data recorded.   PHYSICAL  EXAMINATION: General: Young caucasian male, critically ill. Neuro: Sedated, non-responsive. HEENT: [place]/AT. Pupils pinpoint, sclerae anicteric. Cardiovascular: RRR, no M/R/G.  Lungs: Respirations even and unlabored.  CTA bilaterally, No W/R/R. Abdomen: BS x 4, soft, NT/ND.  Musculoskeletal: No gross deformities, no edema.  Skin: Intact, warm, no rashes.  LABS:  BMET No results for input(s): NA, K, CL, CO2, BUN, CREATININE, GLUCOSE in the last 168 hours.  Electrolytes No results for input(s): CALCIUM, MG, PHOS in the last 168 hours.  CBC No results for input(s): WBC, HGB, HCT, PLT in the last 168 hours.  Coag's No results for input(s): APTT, INR in the last 168 hours.  Sepsis Markers No results for input(s): LATICACIDVEN, PROCALCITON, O2SATVEN in the last 168 hours.  ABG No results for input(s): PHART, PCO2ART, PO2ART in the last 168 hours.  Liver Enzymes No results for input(s): AST, ALT, ALKPHOS, BILITOT, ALBUMIN in the last 168 hours.  Cardiac Enzymes No results for input(s): TROPONINI, PROBNP in the last 168 hours.  Glucose No results for input(s): GLUCAP in the last 168 hours.  Imaging No results found.   STUDIES:  CT head 6/7 > negative. Echo 6/7 >  Renal US 6/7 >   CULTURES: Blood 6/7 >   ANTIBIOTICS: None.  SIGNIFICANT EVENTS: 6/7 > admit.  LINES/TUBES: ETT 6/7 >  R IJ CVL 6/7 >   DISCUSSION: 21 y.o. male transferred from Blake Kramer after presumed heroine and fentanyl overdose.  Down for possibly 10 hours.  Now intubated  with rhabdo, hyperkalemia, AKI, troponin bump.  ASSESSMENT / PLAN:  PULMONARY A: Respiratory insufficiency - s/p intubation due to inability to protect the airway in the setting of drug overdose. P:   Full vent support. Wean as able. VAP prevention measures. SBT once mental status improves. CXR in AM.  CARDIOVASCULAR A:  Troponin bump - ? Demand after prolonged downtime.  Also consider NSTEMI from drug  overdose. P:  Trend troponin. Assess EKG, echo.  RENAL A:   Hyperkalemia. AKI. Rhabdo. AGMA - lactate. P:   NS @ 150. STAT CMP. Assess renal US. Will likely need nephrology consult.  GASTROINTESTINAL A:   Transaminitis - ? Due to prolonged downtime.  Also consider acute hepatitis given drug abuse and unknown hx of needle use, etc. GI prophylaxis. Nutrition. P:   SUP: Pantoprazole. NPO.  HEMATOLOGIC A:   Hemoconcentration. VTE Prophylaxis. P:  SCD's / heparin. STAT CBC.  INFECTIOUS A:   No indication of infection. P:   Monitor clinically. Assess PCT x 1, blood cultures, HIV, hepatitis panel.  ENDOCRINE A:   No acute issues.   P:   No interventions required.  NEUROLOGIC A:   Acute encephalopathy - due to presumed drug overdose + sedation. Presumed heroine and fentanyl overdose. P:   Sedation:  Propofol gtt / Fentanyl PRN. RASS goal: 0 to -1. Daily WUA.  Family updated: None available.  Interdisciplinary Family Meeting v Palliative Care Meeting:  Due by: 07/15/16.  CC time: 35 min.   Blake Kramer, [place] - C [facility] Pager: [phone]  or [phone] 07/09/2016, 7:56 AM  Attending Note:  I have examined patient, reviewed labs, studies and notes. I have discussed the case with Blake Kramer, and I agree with the data and plans as amended above. 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 Was obtunded, intubated for airway protection. eval revealed UDS w narcs, acute rhabdo amd ARF w hyperkalemia, positive troponin. On my exam he is sedated, unresponsive w pinpoint pupils, lungs are coarse bilaterally, heart regular with no M, abd benign. ECG without ischemic changes. Follow up labs confirm persistent hyperK despite medical treatment. He is oliguric, will need HD. We will consult Renal and plan for HD cath placement. Suspect that he can get iHD based on his hemodynamics. Supportive care  while we address metabolic staus. Prognosis for neuro recovery guarded if his down time was actually 10 hours. WUA this pm and daily.  Independent critical care time is 45 minutes.   Blake Pupaobert Cheila Wickstrom, MD, PhD 07/09/2016, 12:32 PM [facility] [phone] or if no answer [phone]

## 2016-07-09 NOTE — Procedures (Signed)
Hemodialysis Catheter Insertion Procedure Note Blake Kramer [id number] 1995/08/10  Procedure: Insertion of Hemodialysis Catheter Indications: CRRT  Procedure Details Consent: Unable to obtain consent because of altered level of consciousness. Time Out: Verified patient identification, verified procedure, site/side was marked, verified correct patient position, special equipment/implants available, medications/allergies/relevent history reviewed, required imaging and test results available.  Performed  Maximum sterile technique was used including antiseptics, cap, gloves, gown, hand hygiene, mask and sheet. Skin prep: Chlorhexidine; local anesthetic administered A antimicrobial bonded/coated triple lumen catheter was placed in the left internal jugular vein using the Seldinger technique.  Evaluation Blood flow good Complications: No apparent complications Patient did tolerate procedure well. Chest X-ray ordered to verify placement.  CXR: pending.  Procedure performed under direct ultrasound guidance for real time vessel cannulation.      Blake Kramer, [place] - C [place] [facility] Pgr: [phone]  or [phone] 07/09/2016, 11:23 AM

## 2016-07-10 ENCOUNTER — Inpatient Hospital Stay (HOSPITAL_COMMUNITY): Payer: BLUE CROSS/BLUE SHIELD

## 2016-07-10 DIAGNOSIS — J9600 Acute respiratory failure, unspecified whether with hypoxia or hypercapnia: Secondary | ICD-10-CM

## 2016-07-10 LAB — GLUCOSE, CAPILLARY
GLUCOSE-CAPILLARY: 105 mg/dL — AB (ref 65–99)
GLUCOSE-CAPILLARY: 111 mg/dL — AB (ref 65–99)
Glucose-Capillary: 100 mg/dL — ABNORMAL HIGH (ref 65–99)
Glucose-Capillary: 100 mg/dL — ABNORMAL HIGH (ref 65–99)
Glucose-Capillary: 109 mg/dL — ABNORMAL HIGH (ref 65–99)
Glucose-Capillary: 114 mg/dL — ABNORMAL HIGH (ref 65–99)
Glucose-Capillary: 115 mg/dL — ABNORMAL HIGH (ref 65–99)

## 2016-07-10 LAB — POCT ACTIVATED CLOTTING TIME
ACTIVATED CLOTTING TIME: 191 s
ACTIVATED CLOTTING TIME: 186 s
ACTIVATED CLOTTING TIME: 202 s
ACTIVATED CLOTTING TIME: 208 s
Activated Clotting Time: 147 s
Activated Clotting Time: 125 s
Activated Clotting Time: 136 seconds
Activated Clotting Time: 169 s
Activated Clotting Time: 169 s
Activated Clotting Time: 164 s
Activated Clotting Time: 169 s
Activated Clotting Time: 180 seconds
Activated Clotting Time: 175 seconds
Activated Clotting Time: 175 s
Activated Clotting Time: 180 seconds
Activated Clotting Time: 197 seconds
Activated Clotting Time: 197 seconds
Activated Clotting Time: 197 seconds

## 2016-07-10 LAB — RENAL FUNCTION PANEL
Albumin: 3 g/dL — ABNORMAL LOW (ref 3.5–5.0)
Albumin: 2.6 g/dL — ABNORMAL LOW (ref 3.5–5.0)
Anion gap: 11 (ref 5–15)
Anion gap: 9 (ref 5–15)
BUN: 28 mg/dL — ABNORMAL HIGH (ref 6–20)
BUN: 28 mg/dL — ABNORMAL HIGH (ref 6–20)
CALCIUM: 7.5 mg/dL — AB (ref 8.9–10.3)
CHLORIDE: 104 mmol/L (ref 101–111)
CO2: 23 mmol/L (ref 22–32)
CO2: 23 mmol/L (ref 22–32)
Calcium: 7.2 mg/dL — ABNORMAL LOW (ref 8.9–10.3)
Chloride: 106 mmol/L (ref 101–111)
Creatinine, Ser: 4.68 mg/dL — ABNORMAL HIGH (ref 0.61–1.24)
Creatinine, Ser: 4.25 mg/dL — ABNORMAL HIGH (ref 0.61–1.24)
GFR calc Af Amer: 19 mL/min — ABNORMAL LOW (ref >60)
GFR calc Af Amer: 22 mL/min — ABNORMAL LOW
GFR calc non Af Amer: 17 mL/min — ABNORMAL LOW (ref >60)
GFR calc non Af Amer: 19 mL/min — ABNORMAL LOW
GLUCOSE: 106 mg/dL — AB (ref 65–99)
Glucose, Bld: 121 mg/dL — ABNORMAL HIGH (ref 65–99)
Phosphorus: 4.2 mg/dL (ref 2.5–4.6)
Phosphorus: 4.2 mg/dL (ref 2.5–4.6)
Potassium: 4.8 mmol/L (ref 3.5–5.1)
Potassium: 4.2 mmol/L (ref 3.5–5.1)
SODIUM: 138 mmol/L (ref 135–145)
Sodium: 138 mmol/L (ref 135–145)

## 2016-07-10 LAB — CBC
HEMATOCRIT: 49.7 % (ref 39.0–52.0)
Hemoglobin: 16.9 g/dL (ref 13.0–17.0)
MCH: 30.2 pg (ref 26.0–34.0)
MCHC: 34 g/dL (ref 30.0–36.0)
MCV: 88.8 fL (ref 78.0–100.0)
Platelets: 105 10*3/uL — ABNORMAL LOW (ref 150–400)
RBC: 5.6 MIL/uL (ref 4.22–5.81)
RDW: 14.4 % (ref 11.5–15.5)
WBC: 14.6 10*3/uL — AB (ref 4.0–10.5)

## 2016-07-10 LAB — HEPATIC FUNCTION PANEL
ALBUMIN: 2.9 g/dL — AB (ref 3.5–5.0)
ALT: 3495 U/L — ABNORMAL HIGH (ref 17–63)
AST: 2727 U/L — ABNORMAL HIGH (ref 15–41)
Alkaline Phosphatase: 94 U/L (ref 38–126)
BILIRUBIN DIRECT: 0.4 mg/dL (ref 0.1–0.5)
BILIRUBIN TOTAL: 1.4 mg/dL — AB (ref 0.3–1.2)
Indirect Bilirubin: 1 mg/dL — ABNORMAL HIGH (ref 0.3–0.9)
Total Protein: 6.3 g/dL — ABNORMAL LOW (ref 6.5–8.1)

## 2016-07-10 LAB — BLOOD GAS, ARTERIAL
ACID-BASE DEFICIT: 3.1 mmol/L — AB (ref 0.0–2.0)
BICARBONATE: 21.5 mmol/L (ref 20.0–28.0)
FIO2: 0.4
LHR: 18 {breaths}/min
MECHVT: 620 mL
O2 SAT: 92.7 %
PEEP/CPAP: 5 cmH2O
PH ART: 7.353 (ref 7.350–7.450)
Patient temperature: 98.6
pCO2 arterial: 39.8 mmHg (ref 32.0–48.0)
pO2, Arterial: 70.8 mmHg — ABNORMAL LOW (ref 83.0–108.0)

## 2016-07-10 LAB — HEPATITIS PANEL, ACUTE
Hep A IgM: NEGATIVE
Hep B C IgM: NEGATIVE
Hepatitis B Surface Ag: NEGATIVE

## 2016-07-10 LAB — APTT: APTT: 62 s — AB (ref 24–36)

## 2016-07-10 LAB — PHOSPHORUS: Phosphorus: 3 mg/dL (ref 2.5–4.6)

## 2016-07-10 LAB — CK

## 2016-07-10 LAB — MAGNESIUM: Magnesium: 2.3 mg/dL (ref 1.7–2.4)

## 2016-07-10 LAB — TROPONIN I: TROPONIN I: 9.78 ng/mL — AB (ref <0.03)

## 2016-07-10 MED ORDER — DEXTROSE 5 % IV SOLN
INTRAVENOUS | Status: DC
  Administered 2016-07-10: 50 mL via INTRAVENOUS

## 2016-07-10 MED ORDER — VITAL HIGH PROTEIN PO LIQD: 1000.0000 mL | ORAL | Status: DC

## 2016-07-10 MED ORDER — PRO-STAT SUGAR FREE PO LIQD
60.0000 mL | Freq: Three times a day (TID) | ORAL | Status: DC
  Administered 2016-07-10 – 2016-07-12 (×7): 60 mL
  Filled 2016-07-10 (×16): qty 60

## 2016-07-10 MED ORDER — PRO-STAT SUGAR FREE PO LIQD
30.0000 mL | Freq: Two times a day (BID) | ORAL | Status: DC
  Filled 2016-07-10: qty 30

## 2016-07-10 MED ORDER — VITAL HIGH PROTEIN PO LIQD
1000.0000 mL | ORAL | Status: DC
  Administered 2016-07-10 (×4)
  Administered 2016-07-10: 1000 mL
  Administered 2016-07-11 (×10)
  Administered 2016-07-11: 1000 mL
  Administered 2016-07-11 – 2016-07-12 (×11)

## 2016-07-10 MED ORDER — DEXTROSE 5 % IV SOLN
1.0000 g | INTRAVENOUS | Status: AC
  Administered 2016-07-10 – 2016-07-16 (×7): 1 g via INTRAVENOUS
  Filled 2016-07-10 (×8): qty 10

## 2016-07-10 NOTE — Progress Notes (Signed)
Progress Note  Patient Name: Blake Kramer Date of Encounter: 07/10/2016  Primary Cardiologist: New (Dr. Percival Spanish)  Subjective   Intubated.  Sedation is off but he is not yet responsive.    Inpatient Medications    Scheduled Meds: . chlorhexidine gluconate (MEDLINE KIT)  15 mL Mouth Rinse BID  . Chlorhexidine Gluconate Cloth  6 each Topical Daily  . heparin  5,000 Units Subcutaneous Q8H  . mouth rinse  15 mL Mouth Rinse 10 times per day  . pantoprazole (PROTONIX) IV  40 mg Intravenous QHS  . sodium chloride flush  10-40 mL Intracatheter Q12H   Continuous Infusions: . sodium chloride 150 mL/hr at 07/10/16 0600  . sodium chloride 250 mL (07/10/16 0600)  . dextrose 50 mL/hr at 07/10/16 0600  . heparin 10,000 units/ 20 mL infusion syringe 2,350 Units/hr (07/10/16 0812)  . heparin 999 mL (07/10/16 0217)  . dialysis replacement fluid (prismasate) 300 mL/hr at 07/10/16 0606  . dialysis replacement fluid (prismasate) 200 mL/hr at 07/10/16 0435  . dialysate (PRISMASATE) 1,500 mL/hr at 07/10/16 0744  . propofol (DIPRIVAN) infusion Stopped (07/10/16 0814)   PRN Meds: sodium chloride, fentaNYL (SUBLIMAZE) injection, fentaNYL (SUBLIMAZE) injection, heparin, heparin, heparin, sodium chloride flush   Vital Signs    Vitals:   07/10/16 0809 07/10/16 0815 07/10/16 0830 07/10/16 0845  BP:  (!) 130/91 (!) 114/100 128/82  Pulse:  (!) 122 (!) 123 (!) 123  Resp:  (!) 35 (!) 28 (!) 31  Temp: 99.1 F (37.3 C)     TempSrc: Oral     SpO2:  91% 98% 99%  Weight:      Height:        Intake/Output Summary (Last 24 hours) at 07/10/16 0904 Last data filed at 07/10/16 0800  Gross per 24 hour  Intake           4741.5 ml  Output             3641 ml  Net           1100.5 ml   Filed Weights   07/09/16 0800 07/10/16 0500  Weight: 242 lb 1 oz (109.8 kg) 246 lb 0.5 oz (111.6 kg)    Telemetry    Sinus tach. - Personally Reviewed  ECG    NA - Personally Reviewed  Physical Exam   GEN: No  acute distress.   Neck: No  JVD Cardiac: RRR, no murmurs, rubs, or gallops.  Respiratory: Clear  to auscultation bilaterally. GI: Soft, nontender, non-distended  MS: No  edema; No deformity. Neuro:  Unresponsive   Labs    Chemistry Recent Labs Lab 07/09/16 0755 07/09/16 1620 07/10/16 0345  NA 137 139 138  K 6.8* 4.4 4.8  CL 105 106 104  CO2 22 22 23   GLUCOSE 154* 135* 106*  BUN 28* 30* 28*  CREATININE 4.27* 4.56* 4.68*  CALCIUM 7.5* 7.7* 7.5*  PROT 6.6  --   --   ALBUMIN 3.4* 3.3* 3.0*  AST 5,359*  --   --   ALT 4,003*  --   --   ALKPHOS 62  --   --   BILITOT 1.2  --   --   GFRNONAA 19* 17* 17*  GFRAA 21* 20* 19*  ANIONGAP 10 11 11      Hematology Recent Labs Lab 07/09/16 0755 07/10/16 0344  WBC 13.6* 14.6*  RBC 5.22 5.60  HGB 15.7 16.9  HCT 46.7 49.7  MCV 89.5 88.8  MCH 30.1 30.2  MCHC 33.6 34.0  RDW 14.1 14.4  PLT 152 105*    Cardiac Enzymes Recent Labs Lab 07/09/16 0755 07/09/16 1955  TROPONINI 16.84* 24.10*   No results for input(s): TROPIPOC in the last 168 hours.   BNPNo results for input(s): BNP, PROBNP in the last 168 hours.   DDimer No results for input(s): DDIMER in the last 168 hours.   Radiology    US Renal  Result Date: 07/09/2016 CLINICAL DATA:  Acute kidney injury EXAM: RENAL / URINARY TRACT ULTRASOUND COMPLETE COMPARISON:  None. FINDINGS: Right Kidney: Length: 12.8 cm. Increased renal cortical echogenicity. No mass or hydronephrosis visualized. Left Kidney: Length: 12.9 cm. Increased renal cortical echogenicity. No mass or hydronephrosis visualized. Bladder: Decompressed bladder with a Foley catheter present. IMPRESSION: 1. No obstructive uropathy. 2. Increased renal cortical echogenicity as can be seen with acute renal failure. Electronically Signed   By: Kathreen Devoid   On: 07/09/2016 12:03   Dg Chest Port 1 View  Result Date: 07/10/2016 CLINICAL DATA:  Respiratory failure. EXAM: PORTABLE CHEST 1 VIEW COMPARISON:  07/09/2016 .  FINDINGS: Endotracheal tube, NG tube, bilateral IJ lines in stable position. Heart size normal. Low lung volumes with mild basilar atelectasis. No change from prior exam. No pleural effusion or pneumothorax. IMPRESSION: 1. Lines and tubes stable position. 2. Low lung volumes with mild basilar atelectasis. No change from prior exam. Electronically Signed   By: Marcello Moores  Register   On: 07/10/2016 06:43   Dg Chest Port 1 View  Result Date: 07/09/2016 CLINICAL DATA:  Status post left catheter placement. EXAM: PORTABLE CHEST 1 VIEW COMPARISON:  Radiograph of July 09, 2016. FINDINGS: Stable cardiomediastinal silhouette. Endotracheal and orogastric tubes are unchanged in position. Right internal jugular catheter is unchanged. Mild bibasilar subsegmental atelectasis is now noted. Interval placement of left internal jugular dialysis catheter with distal tip in expected position of cavoatrial junction. No pneumothorax or pleural effusion is noted. IMPRESSION: Mild bibasilar subsegmental atelectasis. Interval placement of left internal jugular dialysis catheter with distal tip in expected position of cavoatrial junction. No pneumothorax is noted. Electronically Signed   By: Marijo Conception, M.D.   On: 07/09/2016 12:28   Dg Chest Port 1 View  Result Date: 07/09/2016 CLINICAL DATA:  Endotracheal tube and central line placement. EXAM: PORTABLE CHEST 1 VIEW COMPARISON:  None. FINDINGS: The heart size and mediastinal contours are within normal limits. No pneumothorax or pleural effusion is noted. Minimal right basilar subsegmental atelectasis is noted. Right internal jugular catheter is noted with distal tip in expected position of the SVC. Endotracheal tube is projected over tracheal air shadow with distal tip 6 cm above the carina. Orogastric tube tip is seen in proximal stomach. The visualized skeletal structures are unremarkable. IMPRESSION: Endotracheal tube, orogastric tube and right internal jugular catheter in grossly  good position. No pneumothorax is noted. Minimal right basilar subsegmental atelectasis. Electronically Signed   By: Marijo Conception, M.D.   On: 07/09/2016 08:44    Cardiac Studies   ECHO:  Study Conclusions  - Left ventricle: The cavity size was moderately dilated. Wall   thickness was increased in a pattern of mild LVH. Systolic   function was severely reduced. The estimated ejection fraction   was in the range of 25% to 30%. Diffuse hypokinesis. Left   ventricular diastolic function parameters were normal. - Aortic valve: Valve area (Vmax): 2.47 cm^2. - Atrial septum: No defect or patent foramen ovale was identified.  Patient Profile  21 y.o. male  has no listed PMH in EPIC. Cardiology was consulted for elevated troponin in this patient who was found unresponsive from likely opioid overdose.  He is found to have a dilated cardiomyopathy.   Assessment & Plan    ELEVATED TROPONIN:  Repeat troponin and EKG.  I think that this is secondary to the acute event rather than a primary cause.   He has been hemodynamically stable.  No indication for invasive evaluation.  Supportive care only at this point.  He continues dialysis without improvement in BUN, creat.  Repeat CK is pending.    Signed, Minus Breeding, MD  07/10/2016, 9:04 AM

## 2016-07-10 NOTE — Progress Notes (Signed)
This note also relates to the following rows which could not be included: SpO2 - Cannot attach notes to unvalidated device data  Pt placed back on full vent support due to increased RR and WOB. 

## 2016-07-10 NOTE — Progress Notes (Signed)
PULMONARY / CRITICAL CARE MEDICINE   Name: Blake Kramer MRN: [id number] DOB: 1995-02-04    ADMISSION DATE:  07/09/2016 CONSULTATION DATE:  07/09/16  REFERRING MD:  [facility]  CHIEF COMPLAINT:  Overdose  BRIEF SUMMARY:  Blake Kramer is a 21 y.o. man with no known PMH.  He was brought to [facility] ICU 6/7 from [place] after presumed drug overdose.  EMS was dispatched to pt's location where they found him lying supine on the floor, pale, diaphoretic.  Girlfriend informed EMS that she thought pt had might have been down for roughly 10 hours when she previously saw him.  Per police, there had been several cases of fentanyl and heroine overdoses in the community and per their report, scene looked very similar; therefore, they were suspecting the same in this instance. He was given 3 rounds of narcan with little response.  In ED, he was intubated for airway protection.  Labs revealed multiple metabolic derangements including Lactate 10.4, SCr 4.4, K 7.1, AST 1453, ALT 1008, AG 23, WBC 18.9, Hgb 17.8, Trop 4.48.  UDS positive for opiates.  CT head negative.  He was then transferred to [facility] for further management.  SUBJECTIVE:  RN reports propofol turned off early am, pt has not made purposeful movements.  Becomes tachypneic off sedation.  Tmax 100.3  VITAL SIGNS: BP (!) 111/96   Pulse (!) 126   Temp 99.1 F (37.3 C) (Oral)   Resp (!) 26   Ht 6' (1.829 m)   Wt 246 lb 0.5 oz (111.6 kg)   SpO2 100%   BMI 33.37 kg/m   HEMODYNAMICS:    VENTILATOR SETTINGS: Vent Mode: CPAP;PSV FiO2 (%):  [40 %] 40 % Set Rate:  [18 bmp] 18 bmp Vt Set:  [620 mL] 620 mL PEEP:  [5 cmH20] 5 cmH20 Pressure Support:  [8 cmH20] 8 cmH20 Plateau Pressure:  [23 cmH20-25 cmH20] 23 cmH20  INTAKE / OUTPUT: I/O last 3 completed shifts: In: 4563.9 [I.V.:4403.9; Other:50; IV Piggyback:110] Out: 3583 [Urine:265; Other:3318]   PHYSICAL EXAMINATION: General: young adult male in NAD, critically ill appearing  HEENT:  MM pink/moist, ETT, fair dentition  Neuro: obtunded, pupils 3mm reactive, +gag CV: s1s2 rrr, no m/r/g, sinus tachycardia  PULM: even/non-labored, lungs bilaterally coarse with rhonchi  WU:JWJX, non-tender, bsx4 active  Extremities: warm/dry, no edema  Skin: no rashes or lesions   LABS:  BMET  Recent Labs Lab 07/09/16 0755 07/09/16 1620 07/10/16 0345  NA 137 139 138  K 6.8* 4.4 4.8  CL 105 106 104  CO2 22 22 23   BUN 28* 30* 28*  CREATININE 4.27* 4.56* 4.68*  GLUCOSE 154* 135* 106*    Electrolytes  Recent Labs Lab 07/09/16 0755 07/09/16 1620 07/10/16 0344 07/10/16 0345  CALCIUM 7.5* 7.7*  --  7.5*  MG 2.1  --  2.3  --   PHOS 3.9 4.5  --  4.2    CBC  Recent Labs Lab 07/09/16 0755 07/10/16 0344  WBC 13.6* 14.6*  HGB 15.7 16.9  HCT 46.7 49.7  PLT 152 105*    Coag's  Recent Labs Lab 07/10/16 0344  APTT 62*    Sepsis Markers  Recent Labs Lab 07/09/16 0755 07/09/16 1955  LATICACIDVEN 2.2* 1.8  PROCALCITON 41.54  --     ABG  Recent Labs Lab 07/09/16 0930 07/10/16 0430  PHART 7.287* 7.353  PCO2ART 45.1 39.8  PO2ART 132* 70.8*    Liver Enzymes  Recent Labs Lab 07/09/16 0755 07/09/16 1620 07/10/16 0345 07/10/16  0845  AST 5,359*  --   --  2,727*  ALT 4,003*  --   --  3,495*  ALKPHOS 62  --   --  94  BILITOT 1.2  --   --  1.4*  ALBUMIN 3.4* 3.3* 3.0* 2.9*    Cardiac Enzymes  Recent Labs Lab 07/09/16 0755 07/09/16 1955 07/10/16 0845  TROPONINI 16.84* 24.10* 9.78*    Glucose  Recent Labs Lab 07/09/16 1516 07/09/16 2014 07/09/16 2347 07/10/16 0012 07/10/16 0347 07/10/16 0811  GLUCAP 67 103* 48* 100* 100* 109*    Imaging US Renal  Result Date: 07/09/2016 CLINICAL DATA:  Acute kidney injury EXAM: RENAL / URINARY TRACT ULTRASOUND COMPLETE COMPARISON:  None. FINDINGS: Right Kidney: Length: 12.8 cm. Increased renal cortical echogenicity. No mass or hydronephrosis visualized. Left Kidney: Length: 12.9 cm. Increased  renal cortical echogenicity. No mass or hydronephrosis visualized. Bladder: Decompressed bladder with a Foley catheter present. IMPRESSION: 1. No obstructive uropathy. 2. Increased renal cortical echogenicity as can be seen with acute renal failure. Electronically Signed   By: Elige Ko   On: 07/09/2016 12:03   Dg Chest Port 1 View  Result Date: 07/10/2016 CLINICAL DATA:  Respiratory failure. EXAM: PORTABLE CHEST 1 VIEW COMPARISON:  07/09/2016 . FINDINGS: Endotracheal tube, NG tube, bilateral IJ lines in stable position. Heart size normal. Low lung volumes with mild basilar atelectasis. No change from prior exam. No pleural effusion or pneumothorax. IMPRESSION: 1. Lines and tubes stable position. 2. Low lung volumes with mild basilar atelectasis. No change from prior exam. Electronically Signed   By: Maisie Fus  Register   On: 07/10/2016 06:43   Dg Chest Port 1 View  Result Date: 07/09/2016 CLINICAL DATA:  Status post left catheter placement. EXAM: PORTABLE CHEST 1 VIEW COMPARISON:  Radiograph of July 09, 2016. FINDINGS: Stable cardiomediastinal silhouette. Endotracheal and orogastric tubes are unchanged in position. Right internal jugular catheter is unchanged. Mild bibasilar subsegmental atelectasis is now noted. Interval placement of left internal jugular dialysis catheter with distal tip in expected position of cavoatrial junction. No pneumothorax or pleural effusion is noted. IMPRESSION: Mild bibasilar subsegmental atelectasis. Interval placement of left internal jugular dialysis catheter with distal tip in expected position of cavoatrial junction. No pneumothorax is noted. Electronically Signed   By: Lupita Raider, M.D.   On: 07/09/2016 12:28     STUDIES:  CT head 6/7 > negative. Echo 6/7 >> LV moderately dilated, mild LVH, LVEF 25-30%, diffuse hypokinesis Renal US 6/7 >> no obstructive uropathy, increased renal cortical echogenicity as seen with AKI Hepatitis Panel 6/7 >> negative  HIV 6/7 >>  non-reactive  CULTURES: Blood 6/7 >>  ANTIBIOTICS: Rocephin 6/8 >>   SIGNIFICANT EVENTS: 6/07  Admit with presumed overdose, CVVHD started  6/08  Remains obtunded  LINES/TUBES: ETT 6/7 >>  R IJ CVL 6/7 >>  L IJ HD 6/7 >>   DISCUSSION: 21 y.o. male transferred from [place] after presumed heroin and fentanyl overdose.  Down for possibly 10 hours.  Now intubated with rhabdo, hyperkalemia, AKI, troponin bump.  ASSESSMENT / PLAN:  PULMONARY A: Respiratory insufficiency - s/p intubation due to inability to protect the airway in the setting of drug overdose. P:   PRVC 8 cc/kg  Wean PEEP / FiO2 for sats > 92% No wean until mental status supports Intermittent CXR  PRN ABG.  CARDIOVASCULAR A:  Troponin bump - ? Demand after prolonged downtime.  Also consider NSTEMI from drug overdose. Dilated Cardiomyopathy  P:  Cardiology following,  appreciate input  Trend troponin, EKG He will likely need repeat ECHO at some point ICU monitoring of hemodynamics  RENAL A:   Hyperkalemia. AKI. Rhabdo. AGMA - lactate. P:   NS @ 100 Nephrology following, appreciate input  CVVHD > keeping even Trend BMP / urinary output Replace electrolytes as indicated Avoid nephrotoxic agents, ensure adequate renal perfusion Trend CK  GASTROINTESTINAL A:   Transaminitis - ? Due to prolonged downtime.  Also consider acute hepatitis given drug abuse and unknown hx of needle use, etc. Shock Liver GI prophylaxis. Nutrition. P:   Begin TF  PPI for SUP  NPO Trend LFT's  HEMATOLOGIC A:   Hemoconcentration. Thrombocytopenia  VTE Prophylaxis. P:  Trend CBC Heparin SQ + SCD's for DVT prophylaxis   INFECTIOUS A:   [place], [facility] with GPC's, thick yellow oral secretions P:   Trend WBC, fever curve Follow cultures as above Empiric Rocephin with fever, thick secretions & prolonged downtime  ENDOCRINE A:   Hypoglycemia - in setting of  liver dysfunction P:   Begin TF Monitor CBG  NEUROLOGIC A:   Acute encephalopathy - due to presumed drug overdose + sedation. Presumed heroine and fentanyl overdose. P:   RASS Goal: 0 to -1  Monitor off continuous sedation  PRN fentanyl  Daily WUA    Family updated: Mother updated at bedside 6/8.  Interdisciplinary Family Meeting v Palliative Care Meeting:  Due by: 07/15/16.  CC Time:  30 minutes   Canary BrimBrandi Ollis, NP-C [facility] Pgr: [phone] or if no answer [phone] 07/10/2016, 10:28 AM   Attending Note:  I have examined patient, reviewed labs, studies and notes. I have discussed the case with B Ollis, and I agree with the data and plans as amended above. 21 year old man with a history of IV drug abuse, heroin abuse, found on the floor unresponsive after an estimated 10 hours. He was obtunded and intubated for airway protection. Evaluation revealed rhabdo my lysis. He has multiorgan dysfunction with acute renal failure now requiring CVVHD. He experienced a positive troponin and echocardiogram reveals a dilated cardiomyopathy. Also with evidence for shock liver. On evaluation today he is on low-dose propofol for sedation. When this was lightened he was tachypneic and tachycardic did have some spontaneous movement although difficult to tell whether this was purposeful. He has coarse breath sounds bilaterally. CVVHD is running. He does have a spontaneous drive to breathe over the set respiratory rate was mechanical ventilator. We will plan to continue his current level support while we follow neurological, renal, hemodynamic status. I spoke with his family at bedside today and underscored the severity of his illness. They understand that he may not recover if he has sustained a significant anoxic brain injury. We will follow his neurological status on full support over the next 48-72 hours. We will start tube feeds today. Started Antibiotics given his thick for  secretions and bilateral pulmonary infiltrates.  Independent critical care time is 35 minutes.   Levy Pupaobert Krystiana Fornes, MD, PhD 07/10/2016, 1:54 PM [facility] [phone] or if no answer [phone]

## 2016-07-10 NOTE — Care Management Note (Signed)
Case Management Note  Patient Details  Name: Blake BoutonLindon Kramer MRN: [id number] Date of Birth: February 27, 1995  Subjective/Objective:      Pt admitted post drug overdose              Action/Plan:  Pt remains intubated and on CRRT.  CSW consulted for substance abuse   Expected Discharge Date:                  Expected Discharge Plan:     In-House Referral:  Clinical Social Work  Discharge planning Services     Post Acute Care Choice:    Choice offered to:     DME Arranged:    DME Agency:     HH Arranged:    HH Agency:     Status of Service:     If discussed at [employer] Meetings, dates discussed:    Additional Comments:  Cherylann ParrClaxton, Jahara Dail S, RN 07/10/2016, 4:09 PM

## 2016-07-10 NOTE — Progress Notes (Signed)
Initial Nutrition Assessment  DOCUMENTATION CODES:   Obesity unspecified  INTERVENTION:   Vital High Protein @ 35 ml/hr (840 ml/day) 60 ml Prostat TID Provides: 1440 kcal, 163 grams protein, and 702 ml free water.   NUTRITION DIAGNOSIS:   Inadequate oral intake related to inability to eat as evidenced by NPO status.  GOAL:   Provide needs based on ASPEN/SCCM guidelines  MONITOR:   I & O's, Vent status, Labs  REASON FOR ASSESSMENT:   Consult, Ventilator Assessment of nutrition requirement/status  ASSESSMENT:   Pt with no know PMH admitted after presumed heroine and fentanyl OD possibly down for 10 hours. Intubated with rhabdo, hyperkalemia, AKI.    Pt started on CVVHD Patient is currently intubated on ventilator support  Propofol: currently off per RN  Labs reviewed: K+ 4.8 (WNL), BUN/Cr 28/4.68 (H), PO4 and magnesium are WNL  Diet Order:  Diet NPO time specified  Skin:  Reviewed, no issues  Last BM:  unknown  Height:   Ht Readings from Last 1 Encounters:  07/09/16 6' (1.829 m)    Weight:   Wt Readings from Last 1 Encounters:  07/10/16 246 lb 0.5 oz (111.6 kg)    Ideal Body Weight:  80.9 kg  BMI:  Body mass index is 33.37 kg/m.  Estimated Nutritional Needs:   Kcal:  [id number]  Protein:  >161 grams  Fluid:  > 1.5 L/day  EDUCATION NEEDS:   No education needs identified at this time  Kendell BaneHeather Deronda Christian RD, LDN, CNSC [phone] Pager [phone] After Hours Pager

## 2016-07-10 NOTE — Progress Notes (Signed)
[facility] Rounding Note  Subjective/Interval History:  Remains intubated Waking up some Elevated troponins and finding of low EF on ECHO (20-25%) --> cardiology now evaluating  Tolerating CRRT L IJ temp cat (6/7) no issues 300/200/1500 all 4K now Effluent dose 20 ml/kg.hour  Objective Vital signs in last 24 hours: Vitals:   07/10/16 0730 07/10/16 0745 07/10/16 0800 07/10/16 0809  BP: 115/76 111/80 130/82   Pulse: (!) 123 (!) 125 (!) 124   Resp: (!) 26 (!) 27 (!) 31   Temp:    99.1 F (37.3 C)  TempSrc:    Oral  SpO2: 100% 98% 96%   Weight:      Height:       Weight change:   Intake/Output Summary (Last 24 hours) at 07/10/16 9458 Last data filed at 07/10/16 0800  Gross per 24 hour  Intake           4808.8 ml  Output             3816 ml  Net            992.8 ml   Physical Exam:  Blood pressure 130/82, pulse (!) 124, temperature 99.1 F (37.3 C), temperature source Oral, resp. rate (!) 31, height 6' (1.829 m), weight 111.6 kg (246 lb 0.5 oz), SpO2 96 %.   Intubated, tachy at 120 Other VS as noted No JVD R IJ TLC, L IJ temp HD cath (6/7), foley, aline, ETT Coarse breath sounds/tachypnea Heart sounds obscured by resp noise Abd soft, no focal tenderness No edema of the LE's Muddy brown urine foley   Recent Labs Lab 07/09/16 0755 07/09/16 1620 07/10/16 0345  NA 137 139 138  K 6.8* 4.4 4.8  CL 105 106 104  CO2 22 22 23   GLUCOSE 154* 135* 106*  BUN 28* 30* 28*  CREATININE 4.27* 4.56* 4.68*  CALCIUM 7.5* 7.7* 7.5*  PHOS 3.9 4.5 4.2    Recent Labs Lab 07/09/16 0755 07/09/16 1620 07/10/16 0345  AST 5,359*  --   --   ALT 4,003*  --   --   ALKPHOS 62  --   --   BILITOT 1.2  --   --   PROT 6.6  --   --   ALBUMIN 3.4* 3.3* 3.0*     Recent Labs Lab 07/09/16 0755 07/10/16 0344  WBC 13.6* 14.6*  NEUTROABS 12.3*  --   HGB 15.7 16.9  HCT 46.7 49.7  MCV 89.5 88.8  PLT 152 105*    Recent Labs Lab 07/09/16 0755 07/09/16 1955  CKTOTAL >50,000*  --    TROPONINI 16.84* 24.10*   ABG    Component Value Date/Time   PHART 7.353 07/10/2016 0430   PCO2ART 39.8 07/10/2016 0430   PO2ART 70.8 (L) 07/10/2016 0430   HCO3 21.5 07/10/2016 0430   ACIDBASEDEF 3.1 (H) 07/10/2016 0430   O2SAT 92.7 07/10/2016 0430    Recent Labs Lab 07/09/16 2014 07/09/16 2347 07/10/16 0012 07/10/16 0347 07/10/16 0811  GLUCAP 103* 48* 100* 100* 109*   Results for Blake Kramer, Blake Kramer (MRN [id number]) as of 07/10/2016 08:22  Ref. Range 07/09/2016 07:55  Procalcitonin Latest Units: ng/mL 41.54   Studies/Results: US Renal  Result Date: 07/09/2016 CLINICAL DATA:  Acute kidney injury EXAM: RENAL / URINARY TRACT ULTRASOUND COMPLETE COMPARISON:  None. FINDINGS: Right Kidney: Length: 12.8 cm. Increased renal cortical echogenicity. No mass or hydronephrosis visualized. Left Kidney: Length: 12.9 cm. Increased renal cortical echogenicity. No mass or hydronephrosis visualized. Bladder: Decompressed bladder  with a Foley catheter present. IMPRESSION: 1. No obstructive uropathy. 2. Increased renal cortical echogenicity as can be seen with acute renal failure. Electronically Signed   By: Kathreen Devoid   On: 07/09/2016 12:03   Dg Chest Port 1 View  Result Date: 07/10/2016 CLINICAL DATA:  Respiratory failure. EXAM: PORTABLE CHEST 1 VIEW COMPARISON:  07/09/2016 . FINDINGS: Endotracheal tube, NG tube, bilateral IJ lines in stable position. Heart size normal. Low lung volumes with mild basilar atelectasis. No change from prior exam. No pleural effusion or pneumothorax. IMPRESSION: 1. Lines and tubes stable position. 2. Low lung volumes with mild basilar atelectasis. No change from prior exam. Electronically Signed   By: Marcello Moores  Register   On: 07/10/2016 06:43   Dg Chest Port 1 View  Result Date: 07/09/2016 CLINICAL DATA:  Status post left catheter placement. EXAM: PORTABLE CHEST 1 VIEW COMPARISON:  Radiograph of July 09, 2016. FINDINGS: Stable cardiomediastinal silhouette. Endotracheal and  orogastric tubes are unchanged in position. Right internal jugular catheter is unchanged. Mild bibasilar subsegmental atelectasis is now noted. Interval placement of left internal jugular dialysis catheter with distal tip in expected position of cavoatrial junction. No pneumothorax or pleural effusion is noted. IMPRESSION: Mild bibasilar subsegmental atelectasis. Interval placement of left internal jugular dialysis catheter with distal tip in expected position of cavoatrial junction. No pneumothorax is noted. Electronically Signed   By: Marijo Conception, M.D.   On: 07/09/2016 12:28   Dg Chest Port 1 View  Result Date: 07/09/2016 CLINICAL DATA:  Endotracheal tube and central line placement. EXAM: PORTABLE CHEST 1 VIEW COMPARISON:  None. FINDINGS: The heart size and mediastinal contours are within normal limits. No pneumothorax or pleural effusion is noted. Minimal right basilar subsegmental atelectasis is noted. Right internal jugular catheter is noted with distal tip in expected position of the SVC. Endotracheal tube is projected over tracheal air shadow with distal tip 6 cm above the carina. Orogastric tube tip is seen in proximal stomach. The visualized skeletal structures are unremarkable. IMPRESSION: Endotracheal tube, orogastric tube and right internal jugular catheter in grossly good position. No pneumothorax is noted. Minimal right basilar subsegmental atelectasis. Electronically Signed   By: Marijo Conception, M.D.   On: 07/09/2016 08:44   ECHO 07/09/16 Study Conclusions - Left ventricle: The cavity size was moderately dilated. Wall   thickness was increased in a pattern of mild LVH. Systolic   function was severely reduced. The estimated ejection fraction   was in the range of 25% to 30%. Diffuse hypokinesis. Left   ventricular diastolic function parameters were normal. - Aortic valve: Valve area (Vmax): 2.47 cm^2. - Atrial septum: No defect or patent foramen ovale was  identified.   Medications: . sodium chloride 150 mL/hr at 07/10/16 0600  . sodium chloride 250 mL (07/10/16 0600)  . dextrose 50 mL/hr at 07/10/16 0600  . heparin 10,000 units/ 20 mL infusion syringe 2,350 Units/hr (07/10/16 0812)  . heparin 999 mL (07/10/16 0217)  . dialysis replacement fluid (prismasate) 300 mL/hr at 07/10/16 0606  . dialysis replacement fluid (prismasate) 200 mL/hr at 07/10/16 0435  . dialysate (PRISMASATE) 1,500 mL/hr at 07/10/16 0744  . propofol (DIPRIVAN) infusion Stopped (07/10/16 0814)   . chlorhexidine gluconate (MEDLINE KIT)  15 mL Mouth Rinse BID  . Chlorhexidine Gluconate Cloth  6 each Topical Daily  . heparin  5,000 Units Subcutaneous Q8H  . mouth rinse  15 mL Mouth Rinse 10 times per day  . pantoprazole (PROTONIX) IV  40 mg  Intravenous QHS  . sodium chloride flush  10-40 mL Intracatheter Q12H   Background:  21 yo WM with no PMH x for drug addition/use, renal function not known, admitted with anuric AKI (creatinine 4.4), life threatening hyperkalemia (6.8), rhabdo (CPK >67,014), metabolic acidosis following drug OD thought to be heroin + fentanyl and we are asked to provide RRT.  Assessment/Recommendations  1. AKI  - ATN 2/2 rhabdo. Oligoanuric on arrival/muddy brown urine so established ATN. CRRT initiated 6/7 for life threatening hyperkalemia/AKI. Some soft BP's. Extremely catabolic.  1. Keeping volume even.  2. Now on 4K fluids (K down to 4.8).  3. Effluent dose 20 ml/kg/hour (may need to increase DFR depending on next labs).   4. Continue same CRRT  (300/200/1500) for now.  2. Rhabdomyolysis - by report now was "down" for at least 12 hours before EMS was summoned, explaining severity of rhabdo on admission 3. Hyperkalemia - improved and now on 4K fluids. Trending labs. 4. Metabolic acidosis - improved. Not requiring bicarb drip 5. Drug OD - presumed fentanyl and heroin 6. VDRF - management per CCM 7. Elevated troponin - no isch change on ECG.  Cardiology evaluating (Dr. Percival Spanish) "rhabdo vs ischemic process" 8. Acute sCHF LVEF 25-30% ECHO 6/7. No prior ECHO. Clinically not in HF   Jamal Maes, MD [facility] [phone] pager 07/10/2016, 8:22 AM

## 2016-07-11 ENCOUNTER — Inpatient Hospital Stay (HOSPITAL_COMMUNITY): Payer: BLUE CROSS/BLUE SHIELD

## 2016-07-11 DIAGNOSIS — G9340 Encephalopathy, unspecified: Secondary | ICD-10-CM

## 2016-07-11 LAB — HEPATIC FUNCTION PANEL
ALK PHOS: 102 U/L (ref 38–126)
ALT: 2794 U/L — ABNORMAL HIGH (ref 17–63)
AST: 1359 U/L — ABNORMAL HIGH (ref 15–41)
Albumin: 2.7 g/dL — ABNORMAL LOW (ref 3.5–5.0)
BILIRUBIN DIRECT: 0.4 mg/dL (ref 0.1–0.5)
BILIRUBIN TOTAL: 0.8 mg/dL (ref 0.3–1.2)
Indirect Bilirubin: 0.4 mg/dL (ref 0.3–0.9)
Total Protein: 6.5 g/dL (ref 6.5–8.1)

## 2016-07-11 LAB — GLUCOSE, CAPILLARY
GLUCOSE-CAPILLARY: 120 mg/dL — AB (ref 65–99)
GLUCOSE-CAPILLARY: 112 mg/dL — AB (ref 65–99)
Glucose-Capillary: 103 mg/dL — ABNORMAL HIGH (ref 65–99)
Glucose-Capillary: 107 mg/dL — ABNORMAL HIGH (ref 65–99)
Glucose-Capillary: 107 mg/dL — ABNORMAL HIGH (ref 65–99)
Glucose-Capillary: 97 mg/dL (ref 65–99)

## 2016-07-11 LAB — CK: CK TOTAL: 32008 U/L — AB (ref 49–397)

## 2016-07-11 LAB — RENAL FUNCTION PANEL
ALBUMIN: 2.8 g/dL — AB (ref 3.5–5.0)
ANION GAP: 12 (ref 5–15)
Albumin: 2.7 g/dL — ABNORMAL LOW (ref 3.5–5.0)
Anion gap: 10 (ref 5–15)
BUN: 36 mg/dL — ABNORMAL HIGH (ref 6–20)
BUN: 40 mg/dL — AB (ref 6–20)
CHLORIDE: 101 mmol/L (ref 101–111)
CO2: 24 mmol/L (ref 22–32)
CO2: 25 mmol/L (ref 22–32)
Calcium: 7.9 mg/dL — ABNORMAL LOW (ref 8.9–10.3)
Calcium: 8.1 mg/dL — ABNORMAL LOW (ref 8.9–10.3)
Chloride: 101 mmol/L (ref 101–111)
Creatinine, Ser: 4.48 mg/dL — ABNORMAL HIGH (ref 0.61–1.24)
Creatinine, Ser: 4.35 mg/dL — ABNORMAL HIGH (ref 0.61–1.24)
GFR calc Af Amer: 20 mL/min — ABNORMAL LOW (ref >60)
GFR calc Af Amer: 21 mL/min — ABNORMAL LOW (ref >60)
GFR calc non Af Amer: 18 mL/min — ABNORMAL LOW (ref >60)
GFR, EST NON AFRICAN AMERICAN: 17 mL/min — AB (ref >60)
GLUCOSE: 112 mg/dL — AB (ref 65–99)
GLUCOSE: 129 mg/dL — AB (ref 65–99)
PHOSPHORUS: 4.7 mg/dL — AB (ref 2.5–4.6)
POTASSIUM: 4.5 mmol/L (ref 3.5–5.1)
Phosphorus: 4.4 mg/dL (ref 2.5–4.6)
Potassium: 4.2 mmol/L (ref 3.5–5.1)
Sodium: 137 mmol/L (ref 135–145)
Sodium: 136 mmol/L (ref 135–145)

## 2016-07-11 LAB — POCT ACTIVATED CLOTTING TIME
ACTIVATED CLOTTING TIME: 186 s
ACTIVATED CLOTTING TIME: 191 s
ACTIVATED CLOTTING TIME: 191 s
ACTIVATED CLOTTING TIME: 208 s
ACTIVATED CLOTTING TIME: 219 s
ACTIVATED CLOTTING TIME: 230 s
ACTIVATED CLOTTING TIME: 235 s
ACTIVATED CLOTTING TIME: 235 s
Activated Clotting Time: 208 seconds
Activated Clotting Time: 224 seconds
Activated Clotting Time: 230 seconds
Activated Clotting Time: 230 seconds
Activated Clotting Time: 235 seconds
Activated Clotting Time: 186 seconds

## 2016-07-11 LAB — CBC WITH DIFFERENTIAL/PLATELET
Basophils Absolute: 0.1 10*3/uL (ref 0.0–0.1)
Basophils Relative: 0 %
EOS ABS: 0.4 10*3/uL (ref 0.0–0.7)
Eosinophils Relative: 2 %
HCT: 49.1 % (ref 39.0–52.0)
HEMOGLOBIN: 16.5 g/dL (ref 13.0–17.0)
LYMPHS ABS: 1.4 10*3/uL (ref 0.7–4.0)
LYMPHS PCT: 8 %
MCH: 29.8 pg (ref 26.0–34.0)
MCHC: 33.6 g/dL (ref 30.0–36.0)
MCV: 88.8 fL (ref 78.0–100.0)
Monocytes Absolute: 1.4 10*3/uL — ABNORMAL HIGH (ref 0.1–1.0)
Monocytes Relative: 8 %
NEUTROS ABS: 14.7 10*3/uL — AB (ref 1.7–7.7)
NEUTROS PCT: 82 %
Platelets: 114 10*3/uL — ABNORMAL LOW (ref 150–400)
RBC: 5.53 MIL/uL (ref 4.22–5.81)
RDW: 14.6 % (ref 11.5–15.5)
WBC: 17.9 10*3/uL — AB (ref 4.0–10.5)

## 2016-07-11 LAB — PHOSPHORUS
PHOSPHORUS: 4.3 mg/dL (ref 2.5–4.6)
Phosphorus: 4.7 mg/dL — ABNORMAL HIGH (ref 2.5–4.6)

## 2016-07-11 LAB — APTT: aPTT: 97 seconds — ABNORMAL HIGH (ref 24–36)

## 2016-07-11 LAB — MAGNESIUM: Magnesium: 2.4 mg/dL (ref 1.7–2.4)

## 2016-07-11 NOTE — Progress Notes (Signed)
PULMONARY / CRITICAL CARE MEDICINE   Name: Blake Kramer MRN: [id number] DOB: 12-11-1995    ADMISSION DATE:  07/09/2016 CONSULTATION DATE:  07/09/16  [facility]  CHIEF COMPLAINT:  Overdose  BRIEF SUMMARY:  Blake Kramer is a 21 y.o. man with no known PMH.  [facility] [place] after presumed drug overdose.  EMS was dispatched to pt's location where they found him lying supine on the floor, pale, diaphoretic.  Girlfriend informed EMS that she thought pt had might have been down for roughly 10 hours when she previously saw him.  Per police, there had been several cases of fentanyl and heroine overdoses in the community and per their report, scene looked very similar; therefore, they were suspecting the same in this instance. He was given 3 rounds of narcan with little response.  In ED, he was intubated for airway protection.  Labs revealed multiple metabolic derangements including Lactate 10.4, SCr 4.4, K 7.1, AST 1453, ALT 1008, AG 23, WBC 18.9, Hgb 17.8, Trop 4.48.  UDS positive for opiates.  CT head negative.  [facility]  SUBJECTIVE:   Sedation being weaned, he may have opened eyes to voice for RN this am  VITAL SIGNS: BP 107/65   Pulse (!) 115   Temp 98.5 F (36.9 C) (Oral)   Resp (!) 22   Ht 6' (1.829 m)   Wt 110.5 kg (243 lb 9.7 oz)   SpO2 100%   BMI 33.04 kg/m   HEMODYNAMICS:    VENTILATOR SETTINGS: Vent Mode: PRVC FiO2 (%):  [40 %] 40 % Set Rate:  [18 bmp] 18 bmp Vt Set:  [620 mL] 620 mL PEEP:  [5 cmH20] 5 cmH20 Pressure Support:  [5 cmH20] 5 cmH20 Plateau Pressure:  [16 cmH20-21 cmH20] 16 cmH20  INTAKE / OUTPUT: I/O last 3 completed shifts: In: 6845.7 [I.V.:5742.6; Other:50; NG/GT:1003.1; IV Piggyback:50] Out: 6444 [Urine:56; Other:6388]   PHYSICAL EXAMINATION: General: young man, intubated, no distress.  HEENT: fair dentition, ETT in place  Neuro: sedated, does have a  resp effort CV: regular, no M PULM: bilateral coarse BS, thick secretions.  GI: benign, positive BS Extremities: no edema Skin: no rash   LABS:  BMET  Recent Labs Lab 07/10/16 0345 07/10/16 1600 07/11/16 0645  NA 138 138 137  K 4.8 4.2 4.2  CL 104 106 101  CO2 23 23 24   BUN 28* 28* 36*  CREATININE 4.68* 4.25* 4.48*  GLUCOSE 106* 121* 112*    Electrolytes  Recent Labs Lab 07/09/16 0755  07/10/16 0344 07/10/16 0345 07/10/16 1039 07/10/16 1600 07/11/16 0645  CALCIUM 7.5*  < >  --  7.5*  --  7.2* 7.9*  MG 2.1  --  2.3  --   --   --  2.4  PHOS 3.9  < >  --  4.2 3.0 4.2 4.7*  4.7*  < > = values in this interval not displayed.  CBC  Recent Labs Lab 07/09/16 0755 07/10/16 0344 07/11/16 0645  WBC 13.6* 14.6* 17.9*  HGB 15.7 16.9 16.5  HCT 46.7 49.7 49.1  PLT 152 105* 114*    Coag's  Recent Labs Lab 07/10/16 0344 07/11/16 0645  APTT 62* 97*    Sepsis Markers  Recent Labs Lab 07/09/16 0755 07/09/16 1955  LATICACIDVEN 2.2* 1.8  PROCALCITON 41.54  --     ABG  Recent Labs Lab 07/09/16 0930 07/10/16 0430  PHART 7.287*  7.353  PCO2ART 45.1 39.8  PO2ART 132* 70.8*    Liver Enzymes  Recent Labs Lab 07/09/16 0755  07/10/16 0845 07/10/16 1600 07/11/16 0645  AST 5,359*  --  2,727*  --  1,359*  ALT 4,003*  --  3,495*  --  2,794*  ALKPHOS 62  --  94  --  102  BILITOT 1.2  --  1.4*  --  0.8  ALBUMIN 3.4*  < > 2.9* 2.6* 2.7*  2.8*  < > = values in this interval not displayed.  Cardiac Enzymes  Recent Labs Lab 07/09/16 0755 07/09/16 1955 07/10/16 0845  TROPONINI 16.84* 24.10* 9.78*    Glucose  Recent Labs Lab 07/10/16 1614 07/10/16 2053 07/10/16 2344 07/11/16 0356 07/11/16 0840 07/11/16 1129  GLUCAP 115* 105* 111* 107* 120* 107*    Imaging Dg Chest Port 1 View  Result Date: 07/11/2016 CLINICAL DATA:  Hypoxia EXAM: PORTABLE CHEST 1 VIEW COMPARISON:  July 10, 2016 FINDINGS: Endotracheal tube tip is 4.1 cm above the  carina. The left jugular catheter tip is at the cavoatrial junction. Right jugular catheter tip is in the superior cava near the cavoatrial junction. Nasogastric tube tip and side port are below the diaphragm. No pneumothorax. There is mild atelectasis in the right base. Lungs elsewhere clear. Heart size and pulmonary vascularity are normal. No adenopathy. No bone lesions. IMPRESSION: Tube and catheter positions as described without evident pneumothorax. Right base atelectasis. Lungs elsewhere clear. Stable cardiac silhouette. Electronically Signed   By: Bretta Bang III M.D.   On: 07/11/2016 07:04     STUDIES:  CT head 6/7 > negative. Echo 6/7 >> LV moderately dilated, mild LVH, LVEF 25-30%, diffuse hypokinesis Renal US 6/7 >> no obstructive uropathy, increased renal cortical echogenicity as seen with AKI Hepatitis Panel 6/7 >> negative  HIV 6/7 >> non-reactive  CULTURES: Blood 6/7 >>  ANTIBIOTICS: Rocephin 6/8 >>   SIGNIFICANT EVENTS: 6/07  Admit with presumed overdose, CVVHD started  6/08  Remains obtunded  LINES/TUBES: ETT 6/7 >>  R IJ CVL 6/7 >>  L IJ HD 6/7 >>   DISCUSSION: 21 y.o. male transferred from [place] after presumed heroin and fentanyl overdose.  Down for possibly 10 hours.  Now intubated with rhabdo, hyperkalemia, AKI, troponin bump.  ASSESSMENT / PLAN:  PULMONARY A: Respiratory insufficiency - s/p intubation due to inability to protect the airway in the setting of drug overdose. P:   PRVC 8 cc/kg  Wean PEEP and Fio2 No plans extubation until MS determined Intermittent CXR  CARDIOVASCULAR A:  Troponin bump - ? Demand after prolonged downtime.  Also consider NSTEMI from drug overdose. Dilated Cardiomyopathy  P:  Appreciate cards assistance  He will likely need repeat ECHO at some point Follow tele and hemodynamics/   RENAL A:   Hyperkalemia. AKI. Rhabdo. AGMA - lactate. P:   Continue currrent IVF Appreciate ne[phrology  assistance,continue CVVHD Follow BMP, UOP Replace electrolytes as indicated Follow CK for clearance   GASTROINTESTINAL A:   Transaminitis - ? Due to prolonged downtime.  Also consider acute hepatitis given drug abuse and unknown hx of needle use, etc. Shock Liver GI prophylaxis. Nutrition. P:   TF running PPI NPO Follow LFT for recovery  HEMATOLOGIC A:   Hemoconcentration. Thrombocytopenia  VTE Prophylaxis. P:  Follow CBC Heparin sq  INFECTIOUS A:   Fever - 102 at [facility], 1/4 blood cultures at [facility] with GPC's, thick yellow oral secretions P:   Follow WBC, temp Follow cx data Continue  empiric ceftriaxone,   ENDOCRINE A:   Hypoglycemia - in setting of liver dysfunction P:   TF running Follow CBG  NEUROLOGIC A:   Acute encephalopathy - due to presumed drug overdose + sedation. Presumed heroine and fentanyl overdose. P:   RASS Goal: 0 to -1  Wean sedation 6/9 and reassess MS PRN fentanyl  Daily WUA    Family updated: Mother updated at bedside 6/8.  Interdisciplinary Family Meeting v Palliative Care Meeting:  Due by: 07/15/16.   Independent critical care time is 34 minutes.   Levy Pupa, MD, PhD 07/11/2016, 11:40 AM [facility] [phone] or if no answer [phone]

## 2016-07-11 NOTE — Progress Notes (Signed)
[facility] Rounding Note  Subjective/Interval History:  Remains intubated Agitated with cut in sedation this AM - back on full sedation RN says he opened eyes to his name Purulent looking secretions from ETT  Tolerating CRRT L IJ temp cath (6/7) no issues 300/200/1500 all 4K  Effluent dose 20 ml/kg/hour K and phos stable  Objective Vital signs in last 24 hours: Vitals:   07/11/16 0645 07/11/16 0655 07/11/16 0700 07/11/16 0715  BP: 131/87  128/84 114/73  Pulse:  (S) (!) 148 (!) 128 (!) 118  Resp: (!) 24 (S) (!) 45 (!) 29 (!) 22  Temp:      TempSrc:      SpO2:  (S) (!) 88% 95% 99%  Weight:      Height:       Weight change: 0.7 kg (1 lb 8.7 oz)  Intake/Output Summary (Last 24 hours) at 07/11/16 0731 Last data filed at 07/11/16 0700  Gross per 24 hour  Intake           4228.5 ml  Output             4061 ml  Net            167.5 ml   Physical Exam:  Blood pressure 114/73, pulse (!) 118, temperature 98.9 F (37.2 C), temperature source Oral, resp. rate (!) 22, height 6' (1.829 m), weight 110.5 kg (243 lb 9.7 oz), SpO2 99 %.  CVP 7 Intubated, tachy  Other VS as noted No JVD R IJ TLC, L IJ temp HD cath (6/7), foley, aline, ETT Coarse breath sounds/tachypnea Heart sounds obscured by resp noise Abd soft, no focal tenderness No edema of the LE's Muddy brown urine foley - about 15 cc   Recent Labs Lab 07/09/16 0755 07/09/16 1620 07/10/16 0345 07/10/16 1039 07/10/16 1600 07/11/16 0645  NA 137 139 138  --  138 137  K 6.8* 4.4 4.8  --  4.2 4.2  CL 105 106 104  --  106 101  CO2 _0 --  23 24  GLUCOSE 154* 135* 106*  --  121* 112*  BUN 28* 30* 28*  --  28* 36*  CREATININE 4.27* 4.56* 4.68*  --  4.25* 4.48*  CALCIUM 7.5* 7.7* 7.5*  --  7.2* 7.9*  PHOS 3.9 4.5 4.2 3.0 4.2 4.7*    Recent Labs Lab 07/09/16 0755  07/10/16 0845 07/10/16 1600 07/11/16 0645  AST 5,359*  --  2,727*  --   --   ALT 4,003*  --  3,495*  --   --   ALKPHOS 62  --  94  --   --   BILITOT  1.2  --  1.4*  --   --   PROT 6.6  --  6.3*  --   --   ALBUMIN 3.4*  < > 2.9* 2.6* 2.8*  < > = values in this interval not displayed.   Recent Labs Lab 07/09/16 0755 07/10/16 0344 07/11/16 0645  WBC 13.6* 14.6* 17.9*  NEUTROABS 12.3*  --  14.7*  HGB 15.7 16.9 16.5  HCT 46.7 49.7 49.1  MCV 89.5 88.8 88.8  PLT 152 105* 114*    Recent Labs Lab 07/09/16 0755 07/09/16 1955 07/10/16 0845  CKTOTAL >50,000*  --  >50,000*  TROPONINI 16.84* 24.10* 9.78*   ABG    Component Value Date/Time   PHART 7.353 07/10/2016 0430   PCO2ART 39.8 07/10/2016 0430   PO2ART 70.8 (L) 07/10/2016 0430   HCO3  21.5 07/10/2016 0430   ACIDBASEDEF 3.1 (H) 07/10/2016 0430   O2SAT 92.7 07/10/2016 0430    Recent Labs Lab 07/10/16 1142 07/10/16 1614 07/10/16 2053 07/10/16 2344 07/11/16 0356  GLUCAP 114* 115* 105* 111* 107*   Results for Blake Kramer, Blake Kramer (MRN [id number]) as of 07/10/2016 08:22  Ref. Range 07/09/2016 07:55  Procalcitonin Latest Units: ng/mL 41.54   Studies/Results: US Renal  Result Date: 07/09/2016 CLINICAL DATA:  Acute kidney injury EXAM: RENAL / URINARY TRACT ULTRASOUND COMPLETE COMPARISON:  None. FINDINGS: Right Kidney: Length: 12.8 cm. Increased renal cortical echogenicity. No mass or hydronephrosis visualized. Left Kidney: Length: 12.9 cm. Increased renal cortical echogenicity. No mass or hydronephrosis visualized. Bladder: Decompressed bladder with a Foley catheter present. IMPRESSION: 1. No obstructive uropathy. 2. Increased renal cortical echogenicity as can be seen with acute renal failure. Electronically Signed   By: Kathreen Devoid   On: 07/09/2016 12:03   Dg Chest Port 1 View  Result Date: 07/11/2016 CLINICAL DATA:  Hypoxia EXAM: PORTABLE CHEST 1 VIEW COMPARISON:  July 10, 2016 FINDINGS: Endotracheal tube tip is 4.1 cm above the carina. The left jugular catheter tip is at the cavoatrial junction. Right jugular catheter tip is in the superior cava near the cavoatrial junction.  Nasogastric tube tip and side port are below the diaphragm. No pneumothorax. There is mild atelectasis in the right base. Lungs elsewhere clear. Heart size and pulmonary vascularity are normal. No adenopathy. No bone lesions. IMPRESSION: Tube and catheter positions as described without evident pneumothorax. Right base atelectasis. Lungs elsewhere clear. Stable cardiac silhouette. Electronically Signed   By: Lowella Grip III M.D.   On: 07/11/2016 07:04   Dg Chest Port 1 View  Result Date: 07/10/2016 CLINICAL DATA:  Respiratory failure. EXAM: PORTABLE CHEST 1 VIEW COMPARISON:  07/09/2016 . FINDINGS: Endotracheal tube, NG tube, bilateral IJ lines in stable position. Heart size normal. Low lung volumes with mild basilar atelectasis. No change from prior exam. No pleural effusion or pneumothorax. IMPRESSION: 1. Lines and tubes stable position. 2. Low lung volumes with mild basilar atelectasis. No change from prior exam. Electronically Signed   By: Marcello Moores  Register   On: 07/10/2016 06:43   Dg Chest Port 1 View  Result Date: 07/09/2016 CLINICAL DATA:  Status post left catheter placement. EXAM: PORTABLE CHEST 1 VIEW COMPARISON:  Radiograph of July 09, 2016. FINDINGS: Stable cardiomediastinal silhouette. Endotracheal and orogastric tubes are unchanged in position. Right internal jugular catheter is unchanged. Mild bibasilar subsegmental atelectasis is now noted. Interval placement of left internal jugular dialysis catheter with distal tip in expected position of cavoatrial junction. No pneumothorax or pleural effusion is noted. IMPRESSION: Mild bibasilar subsegmental atelectasis. Interval placement of left internal jugular dialysis catheter with distal tip in expected position of cavoatrial junction. No pneumothorax is noted. Electronically Signed   By: Marijo Conception, M.D.   On: 07/09/2016 12:28   Dg Chest Port 1 View  Result Date: 07/09/2016 CLINICAL DATA:  Endotracheal tube and central line placement. EXAM:  PORTABLE CHEST 1 VIEW COMPARISON:  None. FINDINGS: The heart size and mediastinal contours are within normal limits. No pneumothorax or pleural effusion is noted. Minimal right basilar subsegmental atelectasis is noted. Right internal jugular catheter is noted with distal tip in expected position of the SVC. Endotracheal tube is projected over tracheal air shadow with distal tip 6 cm above the carina. Orogastric tube tip is seen in proximal stomach. The visualized skeletal structures are unremarkable. IMPRESSION: Endotracheal tube, orogastric tube and right internal  jugular catheter in grossly good position. No pneumothorax is noted. Minimal right basilar subsegmental atelectasis. Electronically Signed   By: Marijo Conception, M.D.   On: 07/09/2016 08:44   ECHO 07/09/16 Study Conclusions - Left ventricle: The cavity size was moderately dilated. Wall   thickness was increased in a pattern of mild LVH. Systolic   function was severely reduced. The estimated ejection fraction   was in the range of 25% to 30%. Diffuse hypokinesis. Left   ventricular diastolic function parameters were normal. - Aortic valve: Valve area (Vmax): 2.47 cm^2. - Atrial septum: No defect or patent foramen ovale was identified.   Medications: . sodium chloride 100 mL/hr at 07/11/16 0700  . sodium chloride 250 mL (07/11/16 0700)  . cefTRIAXone (ROCEPHIN)  IV Stopped (07/10/16 1227)  . heparin 10,000 units/ 20 mL infusion syringe 1,850 Units/hr (07/11/16 0600)  . heparin 999 mL (07/10/16 0217)  . dialysis replacement fluid (prismasate) 300 mL/hr at 07/10/16 2115  . dialysis replacement fluid (prismasate) 200 mL/hr at 07/10/16 0435  . dialysate (PRISMASATE) 1,500 mL/hr at 07/11/16 0707  . propofol (DIPRIVAN) infusion 25 mcg/kg/min (07/11/16 0720)   . chlorhexidine gluconate (MEDLINE KIT)  15 mL Mouth Rinse BID  . Chlorhexidine Gluconate Cloth  6 each Topical Daily  . feeding supplement (PRO-STAT SUGAR FREE 64)  60 mL Per Tube  TID  . feeding supplement (VITAL HIGH PROTEIN)  1,000 mL Per Tube Q24H  . heparin  5,000 Units Subcutaneous Q8H  . mouth rinse  15 mL Mouth Rinse 10 times per day  . pantoprazole (PROTONIX) IV  40 mg Intravenous QHS  . sodium chloride flush  10-40 mL Intracatheter Q12H   Background:  21 yo WM PMH drug addition/use, BL renal function not known, admitted with anuric AKI (creatinine 4.4), life threatening hyperkalemia (6.8), rhabdo (CPK >96,283), metabolic acidosis following drug OD thought to be heroin + fentanyl and we are asked to provide RRT.  Assessment/Recommendations  1. AKI  - ATN 2/2 rhabdo. Anuric. CRRT since 6.7 1. Keeping volume even (CVP 7).  2. All 4K fluids  3. Effluent dose 20 ml/kg/hour adequate/stable labs 4. Continue same CRRT  (300/200/1500/akk 4K/heparin)  2. Rhabdomyolysis - by report was "down" at least 12 hours before EMS was summoned, explaining severity of rhabdo on admission 3. Hyperkalemia - improved and now on all 4K fluids.  4. Metabolic acidosis - improved. Not requiring bicarb drip 5. Drug OD - fentanyl and heroin 6. VDRF - management per CCM 7. Elevated troponin - no isch change on ECG. Cardiology evaluating (Dr. Percival Spanish) "rhabdo vs ischemic process" 8. Acute sCHF LVEF 25-30% ECHO 6/7. No prior ECHO. Clinically not in HF   Jamal Maes, MD [facility] [phone] pager 07/11/2016, 7:31 AM

## 2016-07-12 ENCOUNTER — Encounter (HOSPITAL_COMMUNITY): Payer: Self-pay | Admitting: Surgery

## 2016-07-12 ENCOUNTER — Inpatient Hospital Stay (HOSPITAL_COMMUNITY): Payer: BLUE CROSS/BLUE SHIELD

## 2016-07-12 DIAGNOSIS — T50904S Poisoning by unspecified drugs, medicaments and biological substances, undetermined, sequela: Secondary | ICD-10-CM

## 2016-07-12 LAB — HEPATIC FUNCTION PANEL
ALT: 1785 U/L — AB (ref 17–63)
AST: 664 U/L — AB (ref 15–41)
Albumin: 2.6 g/dL — ABNORMAL LOW (ref 3.5–5.0)
Alkaline Phosphatase: 104 U/L (ref 38–126)
BILIRUBIN INDIRECT: 0.5 mg/dL (ref 0.3–0.9)
Bilirubin, Direct: 0.3 mg/dL (ref 0.1–0.5)
Total Bilirubin: 0.8 mg/dL (ref 0.3–1.2)
Total Protein: 6.3 g/dL — ABNORMAL LOW (ref 6.5–8.1)

## 2016-07-12 LAB — GLUCOSE, CAPILLARY
GLUCOSE-CAPILLARY: 102 mg/dL — AB (ref 65–99)
GLUCOSE-CAPILLARY: 106 mg/dL — AB (ref 65–99)
GLUCOSE-CAPILLARY: 74 mg/dL (ref 65–99)
GLUCOSE-CAPILLARY: 84 mg/dL (ref 65–99)
GLUCOSE-CAPILLARY: 87 mg/dL (ref 65–99)
Glucose-Capillary: 103 mg/dL — ABNORMAL HIGH (ref 65–99)
Glucose-Capillary: 93 mg/dL (ref 65–99)

## 2016-07-12 LAB — RENAL FUNCTION PANEL
ANION GAP: 13 (ref 5–15)
ANION GAP: 13 (ref 5–15)
Albumin: 2.7 g/dL — ABNORMAL LOW (ref 3.5–5.0)
Albumin: 2.8 g/dL — ABNORMAL LOW (ref 3.5–5.0)
BUN: 49 mg/dL — ABNORMAL HIGH (ref 6–20)
BUN: 48 mg/dL — ABNORMAL HIGH (ref 6–20)
CALCIUM: 8.5 mg/dL — AB (ref 8.9–10.3)
CO2: 24 mmol/L (ref 22–32)
CO2: 23 mmol/L (ref 22–32)
Calcium: 8.3 mg/dL — ABNORMAL LOW (ref 8.9–10.3)
Chloride: 100 mmol/L — ABNORMAL LOW (ref 101–111)
Chloride: 100 mmol/L — ABNORMAL LOW (ref 101–111)
Creatinine, Ser: 4.41 mg/dL — ABNORMAL HIGH (ref 0.61–1.24)
Creatinine, Ser: 4.27 mg/dL — ABNORMAL HIGH (ref 0.61–1.24)
GFR calc Af Amer: 21 mL/min — ABNORMAL LOW (ref >60)
GFR calc Af Amer: 21 mL/min — ABNORMAL LOW (ref >60)
GFR calc non Af Amer: 18 mL/min — ABNORMAL LOW (ref >60)
GFR, EST NON AFRICAN AMERICAN: 19 mL/min — AB (ref >60)
GLUCOSE: 93 mg/dL (ref 65–99)
Glucose, Bld: 96 mg/dL (ref 65–99)
POTASSIUM: 4.6 mmol/L (ref 3.5–5.1)
Phosphorus: 4.8 mg/dL — ABNORMAL HIGH (ref 2.5–4.6)
Phosphorus: 4.4 mg/dL (ref 2.5–4.6)
Potassium: 4.5 mmol/L (ref 3.5–5.1)
SODIUM: 136 mmol/L (ref 135–145)
Sodium: 137 mmol/L (ref 135–145)

## 2016-07-12 LAB — MAGNESIUM: Magnesium: 2.6 mg/dL — ABNORMAL HIGH (ref 1.7–2.4)

## 2016-07-12 LAB — CBC
HCT: 48.7 % (ref 39.0–52.0)
HEMOGLOBIN: 15.5 g/dL (ref 13.0–17.0)
MCH: 29 pg (ref 26.0–34.0)
MCHC: 31.8 g/dL (ref 30.0–36.0)
MCV: 91.2 fL (ref 78.0–100.0)
Platelets: 126 10*3/uL — ABNORMAL LOW (ref 150–400)
RBC: 5.34 MIL/uL (ref 4.22–5.81)
RDW: 15.2 % (ref 11.5–15.5)
WBC: 15 10*3/uL — ABNORMAL HIGH (ref 4.0–10.5)

## 2016-07-12 LAB — POCT ACTIVATED CLOTTING TIME
ACTIVATED CLOTTING TIME: 197 s
ACTIVATED CLOTTING TIME: 191 s
ACTIVATED CLOTTING TIME: 197 s
ACTIVATED CLOTTING TIME: 219 s
ACTIVATED CLOTTING TIME: 219 s
Activated Clotting Time: 186 seconds
Activated Clotting Time: 208 seconds
Activated Clotting Time: 202 seconds

## 2016-07-12 LAB — TRIGLYCERIDES: Triglycerides: 231 mg/dL — ABNORMAL HIGH (ref <150)

## 2016-07-12 LAB — PHOSPHORUS: PHOSPHORUS: 4.8 mg/dL — AB (ref 2.5–4.6)

## 2016-07-12 LAB — APTT: aPTT: 81 seconds — ABNORMAL HIGH (ref 24–36)

## 2016-07-12 MED ORDER — ORAL CARE MOUTH RINSE
15.0000 mL | Freq: Two times a day (BID) | OROMUCOSAL | Status: DC
  Administered 2016-07-12 – 2016-07-21 (×15): 15 mL via OROMUCOSAL

## 2016-07-12 MED ORDER — HALOPERIDOL LACTATE 5 MG/ML IJ SOLN
2.0000 mg | Freq: Once | INTRAMUSCULAR | Status: AC
  Administered 2016-07-12: 2 mg via INTRAVENOUS
  Filled 2016-07-12: qty 1

## 2016-07-12 MED ORDER — SODIUM CHLORIDE 0.9 % IV SOLN
0.4000 ug/kg/h | INTRAVENOUS | Status: DC
  Administered 2016-07-12: 0.402 ug/kg/h via INTRAVENOUS
  Administered 2016-07-12: 1.203 ug/kg/h via INTRAVENOUS
  Administered 2016-07-12: 1 ug/kg/h via INTRAVENOUS
  Administered 2016-07-13: 0.402 ug/kg/h via INTRAVENOUS
  Administered 2016-07-13 (×2): 0.8 ug/kg/h via INTRAVENOUS
  Administered 2016-07-13: 1.005 ug/kg/h via INTRAVENOUS
  Administered 2016-07-13: 0.603 ug/kg/h via INTRAVENOUS
  Administered 2016-07-13: 0.5 ug/kg/h via INTRAVENOUS
  Administered 2016-07-14: 0.8 ug/kg/h via INTRAVENOUS
  Administered 2016-07-14: 1 ug/kg/h via INTRAVENOUS
  Administered 2016-07-14: 0.9 ug/kg/h via INTRAVENOUS
  Administered 2016-07-14: 0.8 ug/kg/h via INTRAVENOUS
  Administered 2016-07-14: 1.4 ug/kg/h via INTRAVENOUS
  Administered 2016-07-14: 0.8 ug/kg/h via INTRAVENOUS
  Administered 2016-07-15: 1 ug/kg/h via INTRAVENOUS
  Administered 2016-07-15: 0.5 ug/kg/h via INTRAVENOUS
  Administered 2016-07-15: 0.3 ug/kg/h via INTRAVENOUS
  Administered 2016-07-15: 0.5 ug/kg/h via INTRAVENOUS
  Administered 2016-07-15: 1 ug/kg/h via INTRAVENOUS
  Administered 2016-07-16: 0.4 ug/kg/h via INTRAVENOUS
  Administered 2016-07-16: 0.6 ug/kg/h via INTRAVENOUS
  Filled 2016-07-12 (×22): qty 4

## 2016-07-12 MED ORDER — MORPHINE SULFATE (PF) 4 MG/ML IV SOLN
3.0000 mg | Freq: Once | INTRAVENOUS | Status: AC
  Administered 2016-07-12: 3 mg via INTRAVENOUS
  Filled 2016-07-12: qty 1

## 2016-07-12 MED ORDER — PANTOPRAZOLE SODIUM 40 MG PO PACK
40.0000 mg | PACK | Freq: Every day | ORAL | Status: DC
  Administered 2016-07-15: 40 mg
  Filled 2016-07-12 (×5): qty 20

## 2016-07-12 MED ORDER — SODIUM CHLORIDE 0.9 % IV SOLN
0.4000 ug/kg/h | INTRAVENOUS | Status: DC
  Administered 2016-07-12: 1.203 ug/kg/h via INTRAVENOUS
  Filled 2016-07-12 (×2): qty 2

## 2016-07-12 MED ORDER — FENTANYL CITRATE (PF) 100 MCG/2ML IJ SOLN
50.0000 ug | INTRAMUSCULAR | Status: DC | PRN
  Administered 2016-07-12 – 2016-07-14 (×9): 50 ug via INTRAVENOUS
  Filled 2016-07-12 (×11): qty 2

## 2016-07-12 NOTE — Progress Notes (Signed)
Pt self extubated.  Elink notified.  Placed on [place] 4 Lpm with humidity, no stridor noted.  Pt tolerating well at this time but not following commands.

## 2016-07-12 NOTE — Progress Notes (Signed)
[facility] Rounding Note  Subjective/Interval History:  Remains intubated Gets agitated w/lightening of sedation  CRRT (started 6/7) L IJ temp cath (6/7) no issues 300/200/1500 all 4K  Effluent dose 19-20 ml/kg/hour K and phos stable  Objective Vital signs in last 24 hours: Vitals:   07/12/16 0630 07/12/16 0645 07/12/16 0700 07/12/16 0715  BP: 104/65 113/73 110/71 120/78  Pulse: 99 100 100 (!) 103  Resp: _0 (!) 22  Temp:   98.6 F (37 C)   TempSrc:   Core (Comment)   SpO2: 100% 100% 100% 99%  Weight:      Height:       Weight change: -1.1 kg (-2 lb 6.8 oz)  Intake/Output Summary (Last 24 hours) at 07/12/16 0800 Last data filed at 07/12/16 0700  Gross per 24 hour  Intake          2826.03 ml  Output             3181 ml  Net          -354.97 ml   Physical Exam:  Blood pressure 120/78, pulse (!) 103, temperature 98.6 F (37 C), temperature source Core (Comment), resp. rate (!) 22, height 6' (1.829 m), weight 109.4 kg (241 lb 2.9 oz), SpO2 99 %.  Intubated, tachy  Other VS as noted No JVD R IJ TLC, L IJ temp HD cath (6/7), foley, aline, ETT Hands in mittens Coarse breath sounds/tachypnea/ins "squeak" L lung field Heart sounds obscured by resp noise - regular and heart sounds grossly normal Abd soft, no focal tenderness Some UE edema No UOP   Recent Labs Lab 07/09/16 0755 07/09/16 1620 07/10/16 0345 07/10/16 1039 07/10/16 1600 07/11/16 0645 07/11/16 1645 07/12/16 0410 07/12/16 0500  NA 137 139 138  --  138 137 136  --  137  K 6.8* 4.4 4.8  --  4.2 4.2 4.5  --  4.6  CL 105 106 104  --  106 101 101  --  100*  CO2 _1 --  _2 --  24  GLUCOSE 154* 135* 106*  --  121* 112* 129*  --  93  BUN 28* 30* 28*  --  28* 36* 40*  --  49*  CREATININE 4.27* 4.56* 4.68*  --  4.25* 4.48* 4.35*  --  4.41*  CALCIUM 7.5* 7.7* 7.5*  --  7.2* 7.9* 8.1*  --  8.3*  PHOS 3.9 4.5 4.2 3.0 4.2 4.7*  4.7* 4.3  4.4 4.8* 4.8*    Recent Labs Lab 07/10/16 0845   07/11/16 0645 07/11/16 1645 07/12/16 0410 07/12/16 0500  AST 2,727*  --  1,359*  --  664*  --   ALT 3,495*  --  2,794*  --  1,785*  --   ALKPHOS 94  --  102  --  104  --   BILITOT 1.4*  --  0.8  --  0.8  --   PROT 6.3*  --  6.5  --  6.3*  --   ALBUMIN 2.9*  < > 2.7*  2.8* 2.7* 2.6* 2.7*  < > = values in this interval not displayed.   Recent Labs Lab 07/09/16 0755 07/10/16 0344 07/11/16 0645 07/12/16 0410  WBC 13.6* 14.6* 17.9* 15.0*  NEUTROABS 12.3*  --  14.7*  --   HGB 15.7 16.9 16.5 15.5  HCT 46.7 49.7 49.1 48.7  MCV 89.5 88.8 88.8 91.2  PLT 152 105* 114* 126*    Recent  Labs Lab 07/11/16 1129 07/11/16 1645 07/11/16 1939 07/12/16 0002 07/12/16 0346  GLUCAP 107* 112* 103* 97 87   Lab Results  Component Value Date   CKTOTAL 32,008 (H) 07/11/2016   Studies/Results: Dg Chest Port 1 View  Result Date: 07/12/2016 CLINICAL DATA:  Acute respiratory failure EXAM: PORTABLE CHEST 1 VIEW COMPARISON:  07/11/2016 and prior radiographs FINDINGS: An endotracheal tube with tip 4 cm above the carina, NG tube entering stomach with tip off the field of view, right central venous catheter with tip overlying the mid SVC and left IJ central venous catheter with tip overlying the lower SVC again noted. This is a low volume film with mild bibasilar atelectasis again noted. There is no evidence of pneumothorax or definite pleural effusion. IMPRESSION: Unchanged appearance of the chest. Electronically Signed   By: Margarette Canada M.D.   On: 07/12/2016 07:11   Dg Chest Port 1 View  Result Date: 07/11/2016 CLINICAL DATA:  Hypoxia EXAM: PORTABLE CHEST 1 VIEW COMPARISON:  July 10, 2016 FINDINGS: Endotracheal tube tip is 4.1 cm above the carina. The left jugular catheter tip is at the cavoatrial junction. Right jugular catheter tip is in the superior cava near the cavoatrial junction. Nasogastric tube tip and side port are below the diaphragm. No pneumothorax. There is mild atelectasis in the right base.  Lungs elsewhere clear. Heart size and pulmonary vascularity are normal. No adenopathy. No bone lesions. IMPRESSION: Tube and catheter positions as described without evident pneumothorax. Right base atelectasis. Lungs elsewhere clear. Stable cardiac silhouette. Electronically Signed   By: Lowella Grip III M.D.   On: 07/11/2016 07:04   ECHO 07/09/16 Study Conclusions - Left ventricle: The cavity size was moderately dilated. Wall   thickness was increased in a pattern of mild LVH. Systolic   function was severely reduced. The estimated ejection fraction   was in the range of 25% to 30%. Diffuse hypokinesis. Left   ventricular diastolic function parameters were normal. - Aortic valve: Valve area (Vmax): 2.47 cm^2. - Atrial septum: No defect or patent foramen ovale was identified.   Medications: . sodium chloride 50 mL/hr at 07/12/16 0700  . sodium chloride 250 mL (07/12/16 0700)  . cefTRIAXone (ROCEPHIN)  IV Stopped (07/11/16 1230)  . heparin 10,000 units/ 20 mL infusion syringe 2,000 Units/hr (07/12/16 0700)  . heparin 999 mL (07/10/16 0217)  . dialysis replacement fluid (prismasate) 300 mL/hr at 07/11/16 1721  . dialysis replacement fluid (prismasate) 200 mL/hr at 07/12/16 6789  . dialysate (PRISMASATE) 1,500 mL/hr at 07/12/16 0617  . propofol (DIPRIVAN) infusion 40 mcg/kg/min (07/12/16 0700)   . chlorhexidine gluconate (MEDLINE KIT)  15 mL Mouth Rinse BID  . Chlorhexidine Gluconate Cloth  6 each Topical Daily  . feeding supplement (PRO-STAT SUGAR FREE 64)  60 mL Per Tube TID  . feeding supplement (VITAL HIGH PROTEIN)  1,000 mL Per Tube Q24H  . heparin  5,000 Units Subcutaneous Q8H  . mouth rinse  15 mL Mouth Rinse 10 times per day  . pantoprazole (PROTONIX) IV  40 mg Intravenous QHS  . sodium chloride flush  10-40 mL Intracatheter Q12H   Background:  21 yo WM PMH drug addition/use, BL renal function not known, admitted with anuric AKI (creatinine 4.4), life threatening  hyperkalemia (6.8), rhabdo (CPK >38,101), metabolic acidosis following drug OD thought to be heroin + fentanyl and we were asked to provide RRT. CCM placed temp cath 6/7  Assessment/Recommendations  1. AKI  - ATN 2/2 rhabdo. Anuric. CRRT since  6/7 1. Start removing fluid neg 50/hour 2. Continue all 4K fluids  3. Effluent dose 19 ml/kg/hr - increase DFR to 2L/hour, otherwise same pre/post (300/200) 2. Rhabdomyolysis - by report was "down" at least 12 hours before EMS was summoned, explaining severity of rhabdo on admission. Last CPK down to 32K 6/9 3. Resolved hyperkalemia w/CRRT 4. Elevated transaminases - improving 5. Metabolic acidosis - improved and not requiring bicarb drip 6. Drug OD - fentanyl and heroin 7. VDRF - management per CCM 8. Elevated troponin - no isch change on ECG. Cardiology evaluated (Dr. Percival Spanish) "rhabdo vs ischemic process" 9. Acute sCHF LVEF 25-30% ECHO 6/7. No prior ECHO. Clinically not in HF   Jamal Maes, MD [place] pager 07/12/2016, 8:00 AM

## 2016-07-12 NOTE — Progress Notes (Signed)
Progress Note  Patient Name: Blake Kramer Date of Encounter: 07/12/2016  Primary Cardiologist: Hochrein  Subjective   Remains intubated. No response at this point  Inpatient Medications    Scheduled Meds: . chlorhexidine gluconate (MEDLINE KIT)  15 mL Mouth Rinse BID  . Chlorhexidine Gluconate Cloth  6 each Topical Daily  . feeding supplement (PRO-STAT SUGAR FREE 64)  60 mL Per Tube TID  . feeding supplement (VITAL HIGH PROTEIN)  1,000 mL Per Tube Q24H  . heparin  5,000 Units Subcutaneous Q8H  . mouth rinse  15 mL Mouth Rinse 10 times per day  . pantoprazole (PROTONIX) IV  40 mg Intravenous QHS  . sodium chloride flush  10-40 mL Intracatheter Q12H   Continuous Infusions: . sodium chloride 10 mL/hr at 07/12/16 0934  . sodium chloride 250 mL (07/12/16 0700)  . cefTRIAXone (ROCEPHIN)  IV Stopped (07/11/16 1230)  . heparin 10,000 units/ 20 mL infusion syringe 2,000 Units/hr (07/12/16 0846)  . heparin 999 mL (07/10/16 0217)  . dialysis replacement fluid (prismasate) 300 mL/hr at 07/12/16 0932  . dialysis replacement fluid (prismasate) 200 mL/hr at 07/12/16 3149  . dialysate (PRISMASATE) 2,000 mL/hr at 07/12/16 0932  . propofol (DIPRIVAN) infusion 30.055 mcg/kg/min (07/12/16 0900)   PRN Meds: sodium chloride, fentaNYL (SUBLIMAZE) injection, heparin, heparin, heparin, sodium chloride flush   Vital Signs    Vitals:   07/12/16 0800 07/12/16 0816 07/12/16 0839 07/12/16 0900  BP: 109/74   115/76  Pulse: (!) 106 (!) 106  (!) 103  Resp: (!) 23 (!) 25  20  Temp:   98.3 F (36.8 C)   TempSrc:   Oral   SpO2: 99% 100%  100%  Weight:      Height:        Intake/Output Summary (Last 24 hours) at 07/12/16 1023 Last data filed at 07/12/16 0934  Gross per 24 hour  Intake           2887.8 ml  Output             3135 ml  Net           -247.2 ml   Filed Weights   07/10/16 0500 07/11/16 0205 07/12/16 0100  Weight: 246 lb 0.5 oz (111.6 kg) 243 lb 9.7 oz (110.5 kg) 241 lb 2.9 oz  (109.4 kg)    Telemetry    Sinus tachycardia - Personally Reviewed  ECG    none - Personally Reviewed  Physical Exam   GEN: No acute distress.  Intubated Neck: unable to assess JVD Cardiac: RRR, no murmurs, rubs, or gallops.  Respiratory: Clear to auscultation bilaterally. GI: Soft, nontender, non-distended  MS: No edema; No deformity. Neuro:  Nonfocal  Psych: Normal affect   Labs    Chemistry Recent Labs Lab 07/10/16 0845  07/11/16 0645 07/11/16 1645 07/12/16 0410 07/12/16 0500  NA  --   < > 137 136  --  137  K  --   < > 4.2 4.5  --  4.6  CL  --   < > 101 101  --  100*  CO2  --   < > 24 25  --  24  GLUCOSE  --   < > 112* 129*  --  93  BUN  --   < > 36* 40*  --  49*  CREATININE  --   < > 4.48* 4.35*  --  4.41*  CALCIUM  --   < > 7.9* 8.1*  --  8.3*  PROT 6.3*  --  6.5  --  6.3*  --   ALBUMIN 2.9*  < > 2.7*  2.8* 2.7* 2.6* 2.7*  AST 2,727*  --  1,359*  --  664*  --   ALT 3,495*  --  2,794*  --  1,785*  --   ALKPHOS 94  --  102  --  104  --   BILITOT 1.4*  --  0.8  --  0.8  --   GFRNONAA  --   < > 17* 18*  --  18*  GFRAA  --   < > 20* 21*  --  21*  ANIONGAP  --   < > 12 10  --  13  < > = values in this interval not displayed.   Hematology Recent Labs Lab 07/10/16 0344 07/11/16 0645 07/12/16 0410  WBC 14.6* 17.9* 15.0*  RBC 5.60 5.53 5.34  HGB 16.9 16.5 15.5  HCT 49.7 49.1 48.7  MCV 88.8 88.8 91.2  MCH 30.2 29.8 29.0  MCHC 34.0 33.6 31.8  RDW 14.4 14.6 15.2  PLT 105* 114* 126*    Cardiac Enzymes Recent Labs Lab 07/09/16 0755 07/09/16 1955 07/10/16 0845  TROPONINI 16.84* 24.10* 9.78*   No results for input(s): TROPIPOC in the last 168 hours.   BNPNo results for input(s): BNP, PROBNP in the last 168 hours.   DDimer No results for input(s): DDIMER in the last 168 hours.   Radiology    Dg Chest Port 1 View  Result Date: 07/12/2016 CLINICAL DATA:  Acute respiratory failure EXAM: PORTABLE CHEST 1 VIEW COMPARISON:  07/11/2016 and prior  radiographs FINDINGS: An endotracheal tube with tip 4 cm above the carina, NG tube entering stomach with tip off the field of view, right central venous catheter with tip overlying the mid SVC and left IJ central venous catheter with tip overlying the lower SVC again noted. This is a low volume film with mild bibasilar atelectasis again noted. There is no evidence of pneumothorax or definite pleural effusion. IMPRESSION: Unchanged appearance of the chest. Electronically Signed   By: Margarette Canada M.D.   On: 07/12/2016 07:11   Dg Chest Port 1 View  Result Date: 07/11/2016 CLINICAL DATA:  Hypoxia EXAM: PORTABLE CHEST 1 VIEW COMPARISON:  July 10, 2016 FINDINGS: Endotracheal tube tip is 4.1 cm above the carina. The left jugular catheter tip is at the cavoatrial junction. Right jugular catheter tip is in the superior cava near the cavoatrial junction. Nasogastric tube tip and side port are below the diaphragm. No pneumothorax. There is mild atelectasis in the right base. Lungs elsewhere clear. Heart size and pulmonary vascularity are normal. No adenopathy. No bone lesions. IMPRESSION: Tube and catheter positions as described without evident pneumothorax. Right base atelectasis. Lungs elsewhere clear. Stable cardiac silhouette. Electronically Signed   By: Lowella Grip III M.D.   On: 07/11/2016 07:04    Cardiac Studies   Echo - severe LV dysfunction  Patient Profile     21 y.o. male admitted with likely opiod overdose and found to have severe LV dysfunction in the setting of rhabdo and acute renal failure  Assessment & Plan    1. New onset LV dysfunction - unclear if this is due to stress, myocardial injury from rhabdo or something else. His ck is elevated as is the troponin likely represented overall muscle damage rather than an acute MI 2. VDRF - as per pulmonary CCM service.  3. Acute renal failure - not  much urine output at this point 4. Elevated troponin - continue supportive care. Prognosis  guarded.  Signed, Cristopher Peru, MD  07/12/2016, 10:23 AM  Patient ID: Blake Kramer, male   DOB: [birth year], 21 y.o.   MRN: [id number]

## 2016-07-12 NOTE — Progress Notes (Signed)
eLink Physician-Brief Progress Note Patient Name: Blake BoutonLindon Kramer DOB: [birth year] MRN: [id number]   Date of Service  07/12/2016  HPI/Events of Note  self extubated and doing well on 4 L [place] from respiratory standpoint but now restless and agitated.  eICU Interventions  bilat wrist restraints and precedex drip if needed     Intervention Category Minor Interventions: Agitation / anxiety - evaluation and management  Henry RusselSMITH, Desa Rech, P 07/12/2016, 4:45 PM

## 2016-07-12 NOTE — Progress Notes (Signed)
PULMONARY / CRITICAL CARE MEDICINE   Name: Blake Kramer MRN: [id number] DOB: September 03, 1995    ADMISSION DATE:  07/09/2016 CONSULTATION DATE:  07/09/16  [facility]  CHIEF COMPLAINT:  Overdose  BRIEF SUMMARY:  Blake Kramer is a 21 y.o. man with no known PMH.  [facility] [place] after presumed drug overdose.  EMS was dispatched to pt's location where they found him lying supine on the floor, pale, diaphoretic.  Girlfriend informed EMS that she thought pt had might have been down for roughly 10 hours when she previously saw him.  Per police, there had been several cases of fentanyl and heroine overdoses in the community and per their report, scene looked very similar; therefore, they were suspecting the same in this instance. He was given 3 rounds of narcan with little response.  In ED, he was intubated for airway protection.  Labs revealed multiple metabolic derangements including Lactate 10.4, SCr 4.4, K 7.1, AST 1453, ALT 1008, AG 23, WBC 18.9, Hgb 17.8, Trop 4.48.  UDS positive for opiates.  CT head negative.  [facility]  SUBJECTIVE:   Remains on CVVHD Propofol being lightened  VITAL SIGNS: BP 115/76   Pulse (!) 103   Temp 98.3 F (36.8 C) (Oral)   Resp 20   Ht 6' (1.829 m)   Wt 109.4 kg (241 lb 2.9 oz)   SpO2 100%   BMI 32.71 kg/m   HEMODYNAMICS:    VENTILATOR SETTINGS: Vent Mode: PRVC FiO2 (%):  [40 %] 40 % Set Rate:  [18 bmp] 18 bmp Vt Set:  [620 mL] 620 mL PEEP:  [5 cmH20] 5 cmH20 Plateau Pressure:  [15 cmH20-17 cmH20] 17 cmH20  INTAKE / OUTPUT: I/O last 3 completed shifts: In: 4919.6 [I.V.:3489.6; NG/GT:1380; IV Piggyback:50] Out: 5240 [Urine:18; Other:5222]   PHYSICAL EXAMINATION: General: Young man, intubated, no distress HEENT: ET tube in place, no oral lesions, pupils equal Neuro: Evaluated him off propofol for several minutes. He did not open eyes to voice. He did not  follow commands. He had some flexure movement of his bilateral upper extremities. On one occasion he may have had purposeful movement towards his ET tube, he does breathe over the set ventilator rate CV: Regular without murmur PULM: Coarse bilaterally GI: Benign, positive bowel sounds Extremities: No significant edema Skin: No rash   LABS:  BMET  Recent Labs Lab 07/11/16 0645 07/11/16 1645 07/12/16 0500  NA 137 136 137  K 4.2 4.5 4.6  CL 101 101 100*  CO2 24 25 24   BUN 36* 40* 49*  CREATININE 4.48* 4.35* 4.41*  GLUCOSE 112* 129* 93    Electrolytes  Recent Labs Lab 07/10/16 0344  07/11/16 0645 07/11/16 1645 07/12/16 0410 07/12/16 0500  CALCIUM  --   < > 7.9* 8.1*  --  8.3*  MG 2.3  --  2.4  --  2.6*  --   PHOS  --   < > 4.7*  4.7* 4.3  4.4 4.8* 4.8*  < > = values in this interval not displayed.  CBC  Recent Labs Lab 07/10/16 0344 07/11/16 0645 07/12/16 0410  WBC 14.6* 17.9* 15.0*  HGB 16.9 16.5 15.5  HCT 49.7 49.1 48.7  PLT 105* 114* 126*    Coag's  Recent Labs Lab 07/10/16 0344 07/11/16 0645 07/12/16 0410  APTT 62* 97* 81*    Sepsis Markers  Recent Labs Lab 07/09/16 0755 07/09/16 1955  LATICACIDVEN 2.2* 1.8  PROCALCITON 41.54  --     ABG  Recent Labs Lab 07/09/16 0930 07/10/16 0430  PHART 7.287* 7.353  PCO2ART 45.1 39.8  PO2ART 132* 70.8*    Liver Enzymes  Recent Labs Lab 07/10/16 0845  07/11/16 0645 07/11/16 1645 07/12/16 0410 07/12/16 0500  AST 2,727*  --  1,359*  --  664*  --   ALT 3,495*  --  2,794*  --  1,785*  --   ALKPHOS 94  --  102  --  104  --   BILITOT 1.4*  --  0.8  --  0.8  --   ALBUMIN 2.9*  < > 2.7*  2.8* 2.7* 2.6* 2.7*  < > = values in this interval not displayed.  Cardiac Enzymes  Recent Labs Lab 07/09/16 0755 07/09/16 1955 07/10/16 0845  TROPONINI 16.84* 24.10* 9.78*    Glucose  Recent Labs Lab 07/11/16 1129 07/11/16 1645 07/11/16 1939 07/12/16 0002 07/12/16 0346 07/12/16 0835   GLUCAP 107* 112* 103* 97 87 84    Imaging Dg Chest Port 1 View  Result Date: 07/12/2016 CLINICAL DATA:  Acute respiratory failure EXAM: PORTABLE CHEST 1 VIEW COMPARISON:  07/11/2016 and prior radiographs FINDINGS: An endotracheal tube with tip 4 cm above the carina, NG tube entering stomach with tip off the field of view, right central venous catheter with tip overlying the mid SVC and left IJ central venous catheter with tip overlying the lower SVC again noted. This is a low volume film with mild bibasilar atelectasis again noted. There is no evidence of pneumothorax or definite pleural effusion. IMPRESSION: Unchanged appearance of the chest. Electronically Signed   By: Harmon Pier M.D.   On: 07/12/2016 07:11     STUDIES:  CT head 6/7 > negative. Echo 6/7 >> LV moderately dilated, mild LVH, LVEF 25-30%, diffuse hypokinesis Renal US 6/7 >> no obstructive uropathy, increased renal cortical echogenicity as seen with AKI Hepatitis Panel 6/7 >> negative  HIV 6/7 >> non-reactive  CULTURES: Blood 6/7 >>  ANTIBIOTICS: Rocephin 6/8 >>   SIGNIFICANT EVENTS: 6/07  Admit with presumed overdose, CVVHD started  6/08  Remains obtunded  LINES/TUBES: ETT 6/7 >>  R IJ CVL 6/7 >>  L IJ HD 6/7 >>   DISCUSSION: 21 y.o. male transferred from [place] after presumed heroin and fentanyl overdose.  Down for possibly 10 hours.  Now intubated with rhabdo, hyperkalemia, AKI, troponin bump.  ASSESSMENT / PLAN:  PULMONARY A: Respiratory insufficiency - s/p intubation due to inability to protect the airway in the setting of drug overdose. Probable aspiration pneumonia versus pneumonitis P:   PRVC 8cc/kg PSV OK as tolerated but does not have the MS for extubation. Will have to follow neuro improvement, discuss wih family goals for care depending on prognosis - may be headed for trach (+ PEG) Intermittent CXR Antibiotics as below   CARDIOVASCULAR A:  Troponin bump - ? Demand after prolonged  downtime.  Also consider NSTEMI from drug overdose. Dilated Cardiomyopathy P:  Plan to repeat echocardiogram in several weeks to look for hopeful improvement Continue to follow hemodynamics and telemetry  RENAL A:   Hyperkalemia. Acute renal failure Rhabdo, CK beginning to clear on 6/9 AGMA - lactate. P:   Discontinue maintenance IV fluids Continue CVVHD per nephrology recommendations Follow BMP, UOP Replace electrolytes as indicated CK in am to assess clearance   GASTROINTESTINAL A:   Shock Liver / Transaminitis - ? Due to prolonged downtime.  Also consider acute  hepatitis given drug abuse and unknown hx of needle use, etc. improving GI prophylaxis. Nutrition. P:   Continue tube feeding PPI Continue to monitor LFT intermittently for recovery Check INR  HEMATOLOGIC A:   Hemoconcentration. Thrombocytopenia  VTE Prophylaxis. P:  Follow CBC Heparin sq and through CVVH  INFECTIOUS A:   Fever - 102 at [facility], 1/4 blood cultures at [facility] with GPC's, thick yellow oral secretions P:   Follow WBC, Temp curve Follow culture data > OSH reported group B strep sputum Continue empiric ceftriaxone as ordered   ENDOCRINE A:   Hypoglycemia - in setting of liver dysfunction P:   TF running Follow CBG  NEUROLOGIC A:   Acute encephalopathy - due to presumed drug overdose + sedation. Presumed heroine and fentanyl overdose. P:   RASS Goal: 0 to -1  Daily wakeup assessment off sedating medication Continue propofol Continue fentanyl as needed Wean sedation 6/9 and reassess MS PRN fentanyl  Daily WUA    Family updated: Mother updated at bedside 6/8.  Interdisciplinary Family Meeting v Palliative Care Meeting:  Due by: 07/15/16.   Independent critical care time is 33 minutes.   Levy Pupa, MD, PhD 07/12/2016, 10:14 AM [facility] [phone] or if no answer [phone]

## 2016-07-13 DIAGNOSIS — R748 Abnormal levels of other serum enzymes: Secondary | ICD-10-CM

## 2016-07-13 DIAGNOSIS — N368 Other specified disorders of urethra: Secondary | ICD-10-CM

## 2016-07-13 DIAGNOSIS — I519 Heart disease, unspecified: Secondary | ICD-10-CM

## 2016-07-13 LAB — RENAL FUNCTION PANEL
ALBUMIN: 2.7 g/dL — AB (ref 3.5–5.0)
ANION GAP: 9 (ref 5–15)
ANION GAP: 10 (ref 5–15)
Albumin: 2.7 g/dL — ABNORMAL LOW (ref 3.5–5.0)
BUN: 45 mg/dL — ABNORMAL HIGH (ref 6–20)
BUN: 36 mg/dL — ABNORMAL HIGH (ref 6–20)
CALCIUM: 8.6 mg/dL — AB (ref 8.9–10.3)
CALCIUM: 8.6 mg/dL — AB (ref 8.9–10.3)
CHLORIDE: 102 mmol/L (ref 101–111)
CO2: 28 mmol/L (ref 22–32)
CO2: 26 mmol/L (ref 22–32)
CREATININE: 4.1 mg/dL — AB (ref 0.61–1.24)
Chloride: 101 mmol/L (ref 101–111)
Creatinine, Ser: 3.34 mg/dL — ABNORMAL HIGH (ref 0.61–1.24)
GFR calc non Af Amer: 25 mL/min — ABNORMAL LOW (ref >60)
GFR, EST AFRICAN AMERICAN: 23 mL/min — AB (ref >60)
GFR, EST AFRICAN AMERICAN: 29 mL/min — AB (ref >60)
GFR, EST NON AFRICAN AMERICAN: 19 mL/min — AB (ref >60)
Glucose, Bld: 101 mg/dL — ABNORMAL HIGH (ref 65–99)
Glucose, Bld: 99 mg/dL (ref 65–99)
POTASSIUM: 5 mmol/L (ref 3.5–5.1)
Phosphorus: 4.4 mg/dL (ref 2.5–4.6)
Phosphorus: 3.6 mg/dL (ref 2.5–4.6)
Potassium: 4.7 mmol/L (ref 3.5–5.1)
SODIUM: 138 mmol/L (ref 135–145)
SODIUM: 138 mmol/L (ref 135–145)

## 2016-07-13 LAB — GLUCOSE, CAPILLARY
GLUCOSE-CAPILLARY: 97 mg/dL (ref 65–99)
GLUCOSE-CAPILLARY: 78 mg/dL (ref 65–99)
GLUCOSE-CAPILLARY: 78 mg/dL (ref 65–99)
GLUCOSE-CAPILLARY: 92 mg/dL (ref 65–99)

## 2016-07-13 LAB — CBC
HCT: 40 % (ref 39.0–52.0)
HEMOGLOBIN: 12.8 g/dL — AB (ref 13.0–17.0)
MCH: 28.9 pg (ref 26.0–34.0)
MCHC: 32 g/dL (ref 30.0–36.0)
MCV: 90.3 fL (ref 78.0–100.0)
Platelets: 115 10*3/uL — ABNORMAL LOW (ref 150–400)
RBC: 4.43 MIL/uL (ref 4.22–5.81)
RDW: 14.9 % (ref 11.5–15.5)
WBC: 15.9 10*3/uL — ABNORMAL HIGH (ref 4.0–10.5)

## 2016-07-13 LAB — POCT ACTIVATED CLOTTING TIME
ACTIVATED CLOTTING TIME: 230 s
ACTIVATED CLOTTING TIME: 230 s
Activated Clotting Time: 213 seconds
Activated Clotting Time: 230 seconds
Activated Clotting Time: 230 seconds
Activated Clotting Time: 224 seconds
Activated Clotting Time: 224 seconds

## 2016-07-13 LAB — CK: Total CK: 13692 U/L — ABNORMAL HIGH (ref 49–397)

## 2016-07-13 LAB — PROTIME-INR
INR: 1.07
PROTHROMBIN TIME: 13.9 s (ref 11.4–15.2)

## 2016-07-13 LAB — PHOSPHORUS: Phosphorus: 4 mg/dL (ref 2.5–4.6)

## 2016-07-13 LAB — MAGNESIUM: Magnesium: 2.8 mg/dL — ABNORMAL HIGH (ref 1.7–2.4)

## 2016-07-13 LAB — APTT: APTT: 117 s — AB (ref 24–36)

## 2016-07-13 MED ORDER — LORAZEPAM 2 MG/ML IJ SOLN
1.0000 mg | INTRAMUSCULAR | Status: DC | PRN
  Administered 2016-07-13: 2 mg via INTRAVENOUS
  Administered 2016-07-14: 1 mg via INTRAVENOUS
  Administered 2016-07-14 – 2016-07-15 (×5): 2 mg via INTRAVENOUS
  Filled 2016-07-13 (×6): qty 1

## 2016-07-13 MED ORDER — MORPHINE SULFATE (PF) 2 MG/ML IV SOLN
INTRAVENOUS | Status: AC
  Administered 2016-07-13: 2 mg via INTRAVESICAL
  Filled 2016-07-13: qty 1

## 2016-07-13 MED ORDER — LORAZEPAM 2 MG/ML IJ SOLN
INTRAMUSCULAR | Status: AC
  Filled 2016-07-13: qty 1

## 2016-07-13 MED ORDER — LIDOCAINE HCL 2 % EX GEL
1.0000 "application " | Freq: Once | CUTANEOUS | Status: AC
  Administered 2016-07-13: 1 via URETHRAL
  Filled 2016-07-13: qty 5

## 2016-07-13 MED ORDER — WHITE PETROLATUM GEL
Status: AC
  Administered 2016-07-13: 10:00:00
  Filled 2016-07-13: qty 1

## 2016-07-13 MED ORDER — ALTEPLASE 2 MG IJ SOLR
2.0000 mg | Freq: Once | INTRAMUSCULAR | Status: AC | PRN
Start: 2016-07-13 — End: 2016-07-13
  Administered 2016-07-13: 2 mg
  Filled 2016-07-13 (×2): qty 2

## 2016-07-13 MED ORDER — NOREPINEPHRINE BITARTRATE 1 MG/ML IV SOLN
0.0000 ug/min | INTRAVENOUS | Status: DC
  Administered 2016-07-14: 2 ug/min via INTRAVENOUS
  Administered 2016-07-14: 7 ug/min via INTRAVENOUS
  Administered 2016-07-15: 3 ug/min via INTRAVENOUS
  Filled 2016-07-13 (×3): qty 4

## 2016-07-13 NOTE — Progress Notes (Signed)
Progress Note  Patient Name: Blake Kramer Date of Encounter: 07/13/2016  Primary Cardiologist: Dr. Antoine Poche  Subjective   Awake and combative, asking for pain meds  Inpatient Medications    Scheduled Meds: . white petrolatum      . Chlorhexidine Gluconate Cloth  6 each Topical Daily  . feeding supplement (PRO-STAT SUGAR FREE 64)  60 mL Per Tube TID  . feeding supplement (VITAL HIGH PROTEIN)  1,000 mL Per Tube Q24H  . heparin  5,000 Units Subcutaneous Q8H  . mouth rinse  15 mL Mouth Rinse BID  . pantoprazole sodium  40 mg Per Tube QHS  . sodium chloride flush  10-40 mL Intracatheter Q12H   Continuous Infusions: . sodium chloride Stopped (07/13/16 0008)  . sodium chloride 250 mL (07/13/16 0700)  . cefTRIAXone (ROCEPHIN)  IV Stopped (07/12/16 1225)  . dexmedetomidine (PRECEDEX) IV infusion 0.603 mcg/kg/hr (07/13/16 1009)  . heparin 10,000 units/ 20 mL infusion syringe 1,750 Units/hr (07/13/16 0941)  . heparin 999 mL (07/10/16 0217)  . dialysis replacement fluid (prismasate) 700 mL/hr at 07/13/16 0715  . dialysis replacement fluid (prismasate) 800 mL/hr at 07/13/16 0939  . dialysate (PRISMASATE) 2,000 mL/hr at 07/13/16 0939   PRN Meds: sodium chloride, fentaNYL (SUBLIMAZE) injection, heparin, heparin, heparin, sodium chloride flush   Vital Signs    Vitals:   07/13/16 0615 07/13/16 0700 07/13/16 0800 07/13/16 0801  BP: (!) 91/52 108/66 (!) 93/46   Pulse: 74 98 80   Resp: 14 (!) 24 (!) 21   Temp:    98 F (36.7 C)  TempSrc:    Axillary  SpO2: 94% 98% 94%   Weight:      Height:        Intake/Output Summary (Last 24 hours) at 07/13/16 1038 Last data filed at 07/13/16 0800  Gross per 24 hour  Intake          1033.23 ml  Output             2439 ml  Net         -1405.77 ml   Filed Weights   07/11/16 0205 07/12/16 0100 07/13/16 0300  Weight: 243 lb 9.7 oz (110.5 kg) 241 lb 2.9 oz (109.4 kg) 238 lb 12.1 oz (108.3 kg)    Telemetry    NSR - Personally  Reviewed  ECG    No new EKG - Personally Reviewed  Physical Exam   GEN: intubated Neck: No JVD Cardiac: RRR, no murmurs, rubs, or gallops.  Respiratory: Clear to auscultation bilaterally. GI: Soft, nontender, non-distended  MS: No edema; No deformity. Neuro:  Nonfocal  Psych: Normal affect   Labs    Chemistry Recent Labs Lab 07/10/16 0845  07/11/16 0645  07/12/16 0410 07/12/16 0500 07/12/16 1700 07/13/16 0420  NA  --   < > 137  < >  --  137 136 138  K  --   < > 4.2  < >  --  4.6 4.5 4.7  CL  --   < > 101  < >  --  100* 100* 101  CO2  --   < > 24  < >  --  24 23 28   GLUCOSE  --   < > 112*  < >  --  93 96 101*  BUN  --   < > 36*  < >  --  49* 48* 45*  CREATININE  --   < > 4.48*  < >  --  4.41*  4.27* 4.10*  CALCIUM  --   < > 7.9*  < >  --  8.3* 8.5* 8.6*  PROT 6.3*  --  6.5  --  6.3*  --   --   --   ALBUMIN 2.9*  < > 2.7*  2.8*  < > 2.6* 2.7* 2.8* 2.7*  AST 2,727*  --  1,359*  --  664*  --   --   --   ALT 3,495*  --  2,794*  --  1,785*  --   --   --   ALKPHOS 94  --  102  --  104  --   --   --   BILITOT 1.4*  --  0.8  --  0.8  --   --   --   GFRNONAA  --   < > 17*  < >  --  18* 19* 19*  GFRAA  --   < > 20*  < >  --  21* 21* 23*  ANIONGAP  --   < > 12  < >  --  13 13 9   < > = values in this interval not displayed.   Hematology Recent Labs Lab 07/10/16 0344 07/11/16 0645 07/12/16 0410  WBC 14.6* 17.9* 15.0*  RBC 5.60 5.53 5.34  HGB 16.9 16.5 15.5  HCT 49.7 49.1 48.7  MCV 88.8 88.8 91.2  MCH 30.2 29.8 29.0  MCHC 34.0 33.6 31.8  RDW 14.4 14.6 15.2  PLT 105* 114* 126*    Cardiac Enzymes Recent Labs Lab 07/09/16 0755 07/09/16 1955 07/10/16 0845  TROPONINI 16.84* 24.10* 9.78*   No results for input(s): TROPIPOC in the last 168 hours.   BNPNo results for input(s): BNP, PROBNP in the last 168 hours.   DDimer No results for input(s): DDIMER in the last 168 hours.   Radiology    Dg Chest Port 1 View  Result Date: 07/12/2016 CLINICAL DATA:  Acute  respiratory failure EXAM: PORTABLE CHEST 1 VIEW COMPARISON:  07/11/2016 and prior radiographs FINDINGS: An endotracheal tube with tip 4 cm above the carina, NG tube entering stomach with tip off the field of view, right central venous catheter with tip overlying the mid SVC and left IJ central venous catheter with tip overlying the lower SVC again noted. This is a low volume film with mild bibasilar atelectasis again noted. There is no evidence of pneumothorax or definite pleural effusion. IMPRESSION: Unchanged appearance of the chest. Electronically Signed   By: Harmon PierJeffrey  Hu M.D.   On: 07/12/2016 07:11    Cardiac Studies   2D echo Study Conclusions  - Left ventricle: The cavity size was moderately dilated. Wall   thickness was increased in a pattern of mild LVH. Systolic   function was severely reduced. The estimated ejection fraction   was in the range of 25% to 30%. Diffuse hypokinesis. Left   ventricular diastolic function parameters were normal. - Aortic valve: Valve area (Vmax): 2.47 cm^2. - Atrial septum: No defect or patent foramen ovale was identified.  Patient Profile     21 y.o. male admitted with likely opiod overdose and found to have severe LV dysfunction in the setting of rhabdo and acute renal failure  Assessment & Plan    1. New onset LV dysfunction  - EF 25-30% - ? unclear if this is due to stress, myocardial injury from rhabdo or something else.  - CPK is elevated as is the troponin likely represented overall muscle damage rather than an acute  MI  2. VDRF - as per pulmonary CCM service.   3. Acute renal failure  - UOP has picked up and was 1.5L net neg yesterday and -844cc since admission. - continue to follow  4. Elevated troponin (peaked at 24) - Likely related to rhabdo and drug overdose/stress - continue supportive care. Prognosis guarded. - repeat echo to see if there is any improvement in EF  Signed, Armanda Magic, MD  07/13/2016, 10:38 AM

## 2016-07-13 NOTE — Progress Notes (Signed)
Subjective: Interval History: combative, responds to some questions..  Objective: Vital signs in last 24 hours: Temp:  [97.6 F (36.4 C)-98.4 F (36.9 C)] 97.7 F (36.5 C) (06/11 0329) Pulse Rate:  [70-127] 98 (06/11 0700) Resp:  [14-27] 24 (06/11 0700) BP: (81-132)/(46-85) 108/66 (06/11 0700) SpO2:  [94 %-100 %] 98 % (06/11 0700) FiO2 (%):  [40 %] 40 % (06/10 1530) Weight:  [108.3 kg (238 lb 12.1 oz)] 108.3 kg (238 lb 12.1 oz) (06/11 0300) Weight change: -1.1 kg (-2 lb 6.8 oz)  Intake/Output from previous day: 06/10 0701 - 06/11 0700 In: 1355.3 [I.V.:990.3; NG/GT:315; IV Piggyback:50] Out: 2879  Intake/Output this shift: No intake/output data recorded.  General appearance: no distress, moderately obese and pale Neck: IJ cath Resp: rhonchi bilaterally and wheezes bilaterally Cardio: S1, S2 normal and systolic murmur: holosystolic 2/6, blowing at apex GI: obese, pos bs, liver down 5 cm Extremitie1+edema none Confused HOH, some cognizance. Blood in foley Lab Results:  Recent Labs  07/11/16 0645 07/12/16 0410  WBC 17.9* 15.0*  HGB 16.5 15.5  HCT 49.1 48.7  PLT 114* 126*   BMET:  Recent Labs  07/12/16 1700 07/13/16 0420  NA 136 138  K 4.5 4.7  CL 100* 101  CO2 23 28  GLUCOSE 96 101*  BUN 48* 45*  CREATININE 4.27* 4.10*  CALCIUM 8.5* 8.6*   No results for input(s): PTH in the last 72 hours. Iron Studies: No results for input(s): IRON, TIBC, TRANSFERRIN, FERRITIN in the last 72 hours.  Studies/Results: Dg Chest Port 1 View  Result Date: 07/12/2016 CLINICAL DATA:  Acute respiratory failure EXAM: PORTABLE CHEST 1 VIEW COMPARISON:  07/11/2016 and prior radiographs FINDINGS: An endotracheal tube with tip 4 cm above the carina, NG tube entering stomach with tip off the field of view, right central venous catheter with tip overlying the mid SVC and left IJ central venous catheter with tip overlying the lower SVC again noted. This is a low volume film with mild  bibasilar atelectasis again noted. There is no evidence of pneumothorax or definite pleural effusion. IMPRESSION: Unchanged appearance of the chest. Electronically Signed   By: Harmon PierJeffrey  Hu M.D.   On: 07/12/2016 07:11    I have reviewed the patient's current medications.  Assessment/Plan: 1 AKI rhabdo oliguric. Mild vol xs. Not good clearance. K/acid/base ok.  NOT candidate for IHD at this time due to MS/Bps. Will see how does in next 24-48h and see if can convert. 2 Drug OD 3 Confusion/combative 4 LOW EF P CRRT, ^ clearance, follow MS,     LOS: 4 days   Katelynn Heidler L 07/13/2016,7:37 AM

## 2016-07-13 NOTE — Progress Notes (Signed)
PULMONARY / CRITICAL CARE MEDICINE   Name: Blake Kramer MRN: [id number] DOB: [birth year]    ADMISSION DATE:  07/09/2016 CONSULTATION DATE:  07/09/16  REFERRING MD:  [facility]  CHIEF COMPLAINT:  Overdose  BRIEF SUMMARY:  Blake Kramer is a 21 y.o. man with no known PMH.  He was brought to [facility] ICU 6/7 from [place] after presumed drug overdose.  EMS was dispatched to pt's location where they found him lying supine on the floor, pale, diaphoretic.  Girlfriend informed EMS that she thought pt had might have been down for roughly 10 hours when she previously saw him.  Per police, there had been several cases of fentanyl and heroine overdoses in the community and per their report, scene looked very similar; therefore, they were suspecting the same in this instance. He was given 3 rounds of narcan with little response.  In ED, he was intubated for airway protection.  Labs revealed multiple metabolic derangements including Lactate 10.4, SCr 4.4, K 7.1, AST 1453, ALT 1008, AG 23, WBC 18.9, Hgb 17.8, Trop 4.48.  UDS positive for opiates.  CT head negative.  He was then transferred to [facility] for further management.  SUBJECTIVE:   Self extubated overnight Has been able to interact with staff, speak. Frequently abusive language Pulled out his Foley catheter this morning with significant bleeding, probably arterial bleed with urethral disruption  VITAL SIGNS: BP (!) 93/46   Pulse 80   Temp 97.6 F (36.4 C) (Oral)   Resp (!) 21   Ht 6' (1.829 m)   Wt 108.3 kg (238 lb 12.1 oz)   SpO2 94%   BMI 32.38 kg/m   HEMODYNAMICS:    VENTILATOR SETTINGS: Vent Mode: PRVC FiO2 (%):  [40 %] 40 % Set Rate:  [18 bmp] 18 bmp Vt Set:  [409 mL] 620 mL PEEP:  [5 cmH20] 5 cmH20 Plateau Pressure:  [18 cmH20] 18 cmH20  INTAKE / OUTPUT: I/O last 3 completed shifts: In: 2909.8 [I.V.:2004.8; NG/GT:855; IV Piggyback:50] Out: 4331 [Urine:5; Other:4326]   PHYSICAL EXAMINATION: General: Ill-appearing young  man, intermittently agitated HEENT: No oral lesions Neuro: Wakes to voice, able to speak and answer simple questions, poorly oriented moves all extremities and requires restraints CV: Regular, no murmur PULM: Coarse bilaterally GI: Soft, positive bowel sounds GU: Acute and brisk bleeding from the urethra with Foley catheter removed Extremities: No significant edema Skin: No rash   LABS:  BMET  Recent Labs Lab 07/12/16 0500 07/12/16 1700 07/13/16 0420  NA 137 136 138  K 4.6 4.5 4.7  CL 100* 100* 101  CO2 24 23 28   BUN 49* 48* 45*  CREATININE 4.41* 4.27* 4.10*  GLUCOSE 93 96 101*    Electrolytes  Recent Labs Lab 07/11/16 0645  07/12/16 0410 07/12/16 0500 07/12/16 1700 07/13/16 0420  CALCIUM 7.9*  < >  --  8.3* 8.5* 8.6*  MG 2.4  --  2.6*  --   --  2.8*  PHOS 4.7*  4.7*  < > 4.8* 4.8* 4.4 4.4  < > = values in this interval not displayed.  CBC  Recent Labs Lab 07/10/16 0344 07/11/16 0645 07/12/16 0410  WBC 14.6* 17.9* 15.0*  HGB 16.9 16.5 15.5  HCT 49.7 49.1 48.7  PLT 105* 114* 126*    Coag's  Recent Labs Lab 07/11/16 0645 07/12/16 0410 07/13/16 0420  APTT 97* 81* 117*  INR  --   --  1.07    Sepsis Markers  Recent Labs Lab 07/09/16 0755 07/09/16  1955  LATICACIDVEN 2.2* 1.8  PROCALCITON 41.54  --     ABG  Recent Labs Lab 07/09/16 0930 07/10/16 0430  PHART 7.287* 7.353  PCO2ART 45.1 39.8  PO2ART 132* 70.8*    Liver Enzymes  Recent Labs Lab 07/10/16 0845  07/11/16 0645  07/12/16 0410 07/12/16 0500 07/12/16 1700 07/13/16 0420  AST 2,727*  --  1,359*  --  664*  --   --   --   ALT 3,495*  --  2,794*  --  1,785*  --   --   --   ALKPHOS 94  --  102  --  104  --   --   --   BILITOT 1.4*  --  0.8  --  0.8  --   --   --   ALBUMIN 2.9*  < > 2.7*  2.8*  < > 2.6* 2.7* 2.8* 2.7*  < > = values in this interval not displayed.  Cardiac Enzymes  Recent Labs Lab 07/09/16 0755 07/09/16 1955 07/10/16 0845  TROPONINI 16.84* 24.10*  9.78*    Glucose  Recent Labs Lab 07/12/16 1651 07/12/16 1942 07/12/16 2345 07/13/16 0328 07/13/16 0758 07/13/16 1133  GLUCAP 102* 93 106* 97 78 78    Imaging No results found.   STUDIES:  CT head 6/7 > negative. Echo 6/7 >> LV moderately dilated, mild LVH, LVEF 25-30%, diffuse hypokinesis Renal US 6/7 >> no obstructive uropathy, increased renal cortical echogenicity as seen with AKI Hepatitis Panel 6/7 >> negative  HIV 6/7 >> non-reactive  CULTURES: Blood 6/7 >>  ANTIBIOTICS: Rocephin 6/8 >>   SIGNIFICANT EVENTS: 6/07  Admit with presumed overdose, CVVHD started  6/08  Remains obtunded 6/11 self extubated 6/11 urethral injury after traumatic removal of Foley catheter  LINES/TUBES: ETT 6/7 >> 6/11 R IJ CVL 6/7 >>  L IJ HD 6/7 >>   DISCUSSION: 21 [place]. [place] presumed heroin and fentanyl overdose.  Down for possibly 10 hours. Was intubated with rhabdo, hyperkalemia, AKI, troponin bump. Neurologically recovering to some degree, able to speak. Self extubated 6/11. Urethral injury and brisk bleeding after traumatic Foley removal by the patient 6/11  ASSESSMENT / PLAN:  PULMONARY A: Respiratory insufficiency - s/p intubation due to inability to protect the airway in the setting of drug overdose. Probable aspiration pneumonia versus pneumonitis P:   Patient self extubated, is clearly protecting airway Pulmonary hygiene Intermittent CXR Continue antibiotics as below   CARDIOVASCULAR A:  Troponin bump - ? Demand after prolonged downtime.  Also consider NSTEMI from drug overdose. Dilated Cardiomyopathy P:  Repeat echocardiogram in several weeks to look for possible improvement Follow hemodynamics and telemetry  RENAL A:   Hyperkalemia. Acute renal failure Rhabdo, CK beginning to clear on 6/9 AGMA - lactate. P:   Continue CVVHD per nephrology recommendations Follow BMP, urine output Replace electrolytes as  indicated  GASTROINTESTINAL A:   Shock Liver / Transaminitis - ? Due to prolonged downtime.  Also consider acute hepatitis given drug abuse and unknown hx of needle use, etc. improving GI prophylaxis. Nutrition. P:   Speech therapy evaluation in the next few days to evaluate for oral intake Defer placement of NG tube as he will likely try to remove, will contribute to his agitation  GU:  A: Urethral trauma from self-foley removal, significant bleeding.  P:  Three-way irrigation catheter placed by urology. Appreciate their assistance The Foley catheter should not be removed without discussing with urology first Flush Foley per  urology recommendations  HEMATOLOGIC A:   Acute blood loss due to urethral trauma Thrombocytopenia  VTE Prophylaxis. P:  Check CBC now and in am Heparin sq and through CVVH  INFECTIOUS A:   Fever - 102 at [place], 1/4 blood cultures at [facility] with GPC's, thick yellow oral secretions P:   Continue empiric ceftriaxone as ordered Follow WBC, temperature Follow culture data, including from outside hospital   ENDOCRINE A:   Hypoglycemia - in setting of liver dysfunction P:   Follow CBG  NEUROLOGIC A:   Acute encephalopathy - due to presumed drug overdose + sedation. Presumed heroine and fentanyl overdose. P:   RASS Goal: 0 Continue Precedex, frequent orientation Haldol as needed Fentanyl as needed   Family updated: Mother updated at bedside 6/8.  Interdisciplinary Family Meeting v Palliative Care Meeting:  Due by: 07/15/16.   Independent critical care time is 34 minutes.   Levy Pupa, MD, PhD 07/13/2016, 1:27 PM [place] [facility] [phone] or if no answer [phone]

## 2016-07-13 NOTE — Progress Notes (Signed)
Late Entry: Beginning around shift change, pt. Became very combative, agitated and pulled on his foley catheter. Immediately bright red blood was noticed in the tubing. Pt. Continued to c/o pain in his genital area. Rosey Batheresa, RN and myself decided to deflate the foley balloon and possibly reinsert to ensure it was not located in his urethra. When the 10cc was removed from the foley the foley came out, confirming it was most likely not in his bladder.  There was a large amount of BRB coming from his penis, a condom cath was attempted, however there was too much blood. Dr. Delton CoombesByrum came to bedside to assess. He consulted Urology who recommended a 3 way 24 coude cath to be inserted. [id number] RN was contacted, who arrived and attempted a 22 and 20 coude cath without success. Urology was then contacted concerning the continuous bleeding.  Resident at bedside, ultimately foley inserted with a guide wire assist. Resident stated the area was as sterile as he could get, but most likely a break in sterile techniques due to pt. Combativeness and agitation.

## 2016-07-13 NOTE — Progress Notes (Signed)
Called by primary RN to insert coude catheter stat.  During cleaning, patients penis started shooting bright red blood.  Per order, attempted a 22 three-way coude catheter but unable to advance at tip of penis.  Attempted 20 three-way coude unsuccessfully.  Patient with copious amounts of blood.  MD at bedside calling urology.  Primary RN at bedside applying pressure to bleed.  Dani Gobbleeardon, Johnanthony Wilden J, RN

## 2016-07-13 NOTE — Procedures (Signed)
Hemodialysis Catheter Insertion Procedure Note Blake BoutonLindon Kramer [id number] Feb 25, 1995  Procedure: Insertion of Hemodialysis Catheter Indications: Hemodialysis  Procedure Details Consent: Unable to obtain consent because of altered level of consciousness.  Time Out: Verified patient identification, verified procedure, site/side was marked, verified correct patient position, special equipment/implants available, medications/allergies/relevent history reviewed, required imaging and test results available.  Performed  Maximum sterile technique was used including antiseptics, cap, gloves, gown, hand hygiene, mask and sheet.  Skin prep: Chlorhexidine; local anesthetic administered  A Trialysis HD catheter was placed in the left internal jugular vein using the Seldinger technique.  Evaluation Blood flow good Complications: No apparent complications Patient did tolerate procedure well. Chest X-ray ordered to verify placement.  CXR: pending.   --Previous line (not working due to bend in line) was re-wired for new line.   Jovita KussmaulKatalina Eubanks, AG-ACNP [facility]  Pgr: [phone]  PCCM Pgr: [phone]

## 2016-07-13 NOTE — Procedures (Signed)
Foley Catheter Placement Note  Indications: 21 year old male admitted with drug overdose who traumatically removed his own foley catheter. Please see separately dictated consult note for history.  Pre-operative Diagnosis: Urethral injury secondary to traumatic foley removal by patient  Post-operative Diagnosis: Same  Attending: Marlou PorchHerrick   Resident: Judie GrieveLomboy  Procedure Details  Patient was placed in the supine position in bed. He was prepped and draped in the usual sterile fashion. He had to be restrained in order to perform the procedure. We injected lidocaine jelly per urethra. I could not easily advance a 24Fr foley into the urethra, secondary to a flap of unsalvageable urethral tissue that was extruding from the meatus. There was likely urethral disruption. This tissue was easily removed. In order to confirm placement in the urethra, I then advanced a Glidewire into the urethra and advanced this into the bladder. The patient notably urinated with this in place. I then advanced a 22 [place] tip catheter and advanced the foley over the wire into the bladder. The balloon was inflated with 30 mL sterile water. We removed the wire and attached it to a drainage bag. I taped down the catheter to the patient's leg in a tension free way. Draining urine was grossly bloody. I irrigated his catheter with a 60cc syringe and returned only a mild amount of clot. There was some mild ongoing hematuria. It was left to straight drainage. No significant urethral bleeding was noted around the catheter after placement.  Complications: None. The patient did not tolerate the procedure well despite 4 point restraints. Security was called as patient was actively violent toward myself and staff.   Plan:   1. Foley catheter to drainage. DO NOT remove without discussing with urology. 2. Irrigate catheter gently with 60cc water through a catheter tip syringe PRN progressive gross hematuria, low urine output, suprapubic  distention or tenderness to palpation.  3. Urology follow-up TBD.   Dr. Marlou PorchHerrick was readily available.   Buck MamJason Tityana Pagan, MD PGY4 Urologic Surgery

## 2016-07-13 NOTE — Progress Notes (Signed)
Tried to restart CRRT at 2200 after cath flow had been removed and central line dressing had been changed, flow error continued unable to start.  CCM NP is notified, RN will continue to monitor

## 2016-07-13 NOTE — Consult Note (Signed)
New Consult Note  Requesting Physician: Levy Pupa, MD Service Requesting Consult: ICU Critical Care  Urology Consult Attending: Marlou Porch Reason for Consult:  Urethral injury  Subjective: Blake Kramer is seen in consultation for reasons noted above.  This is a 21 year old patient with no known past medical history here in the [facility] MICU for drug overdose, suspected to be fentanyl and/or heroine. He was admitted 07/09/16 for further care. He self extubated yesterday. He reportedly removed his foley catheter with the foley balloon intact. There was significant bleeding from the urethral meatus. Urology was called for assistance. Foley catheters could not be placed by nursing staff.   The above history is obtained from prior documentation, patient unable to provide any history.  Past Medical History: History reviewed. No pertinent past medical history.  Past Surgical History:  History reviewed. No pertinent surgical history.  Medication: Current Facility-Administered Medications  Medication Dose Route Frequency Provider Last Rate Last Dose  . 0.9 %  sodium chloride infusion   Intravenous Continuous Camille Bal, MD   Stopped at 07/13/16 0008  . 0.9 %  sodium chloride infusion  250 mL Intravenous PRN Desai, Rahul P, PA-C 10 mL/hr at 07/13/16 0700 250 mL at 07/13/16 0700  . cefTRIAXone (ROCEPHIN) 1 g in dextrose 5 % 50 mL IVPB  1 g Intravenous Q24H Ollis, Brandi L, NP 100 mL/hr at 07/13/16 1151 1 g at 07/13/16 1151  . Chlorhexidine Gluconate Cloth 2 % PADS 6 each  6 each Topical Daily Desai, Rahul P, PA-C   6 each at 07/13/16 1000  . dexmedetomidine (PRECEDEX) 400 mcg in sodium chloride 0.9 % 100 mL (4 mcg/mL) infusion  0.4-1.4 mcg/kg/hr Intravenous Titrated Henry Russel, MD 22 mL/hr at 07/13/16 1150 0.804 mcg/kg/hr at 07/13/16 1150  . feeding supplement (PRO-STAT SUGAR FREE 64) liquid 60 mL  60 mL Per Tube TID Leslye Peer, MD   60 mL at 07/12/16 0936  . feeding supplement  (VITAL HIGH PROTEIN) liquid 1,000 mL  1,000 mL Per Tube Q24H Byrum, Les Pou, MD      . fentaNYL (SUBLIMAZE) injection 50 mcg  50 mcg Intravenous Q2H PRN Henry Russel, MD   50 mcg at 07/13/16 0725  . heparin 10,000 units/ 20 mL infusion syringe  250-3,000 Units/hr CRRT Continuous Camille Bal, MD 3.5 mL/hr at 07/13/16 0941 1,750 Units/hr at 07/13/16 0941  . heparin bolus via infusion syringe 1,000 Units  1,000 Units CRRT PRN Camille Bal, MD   1,000 Units at 07/09/16 2019  . heparin injection 1,000-6,000 Units  1,000-6,000 Units CRRT PRN Camille Bal, MD      . heparin injection 5,000 Units  5,000 Units Subcutaneous Q8H Desai, Rahul P, PA-C   5,000 Units at 07/13/16 1610  . heparinized saline (2000 units/L) primer fluid for CRRT   CRRT PRN Camille Bal, MD 999 mL/hr at 07/10/16 0217 999 mL at 07/10/16 0217  . lidocaine (XYLOCAINE) 2 % jelly 1 application  1 application Urethral Once Kalman Shan, MD      . MEDLINE mouth rinse  15 mL Mouth Rinse BID Leslye Peer, MD   15 mL at 07/13/16 1000  . pantoprazole sodium (PROTONIX) 40 mg/20 mL oral suspension 40 mg  40 mg Per Tube QHS Quenton Fetter, RPH      . prismasol BGK 4/2.5 5,000 mL dialysis replacement fluid   CRRT Continuous Beryle Lathe, MD 700 mL/hr at 07/13/16 0715    . prismasol BGK 4/2.5 5,000 mL dialysis replacement fluid  CRRT Continuous Beryle Latheeterding, James, MD 800 mL/hr at 07/13/16 0939    . prismasol BGK 4/2.5 5,000 mL dialysis solution   CRRT Continuous Camille Balunham, Cynthia, MD 2,000 mL/hr at 07/13/16 [phone]    . sodium chloride flush (NS) 0.9 % injection 10-40 mL  10-40 mL Intracatheter Q12H Desai, Rahul P, PA-C   10 mL at 07/12/16 2200  . sodium chloride flush (NS) 0.9 % injection 10-40 mL  10-40 mL Intracatheter PRN Desai, Rahul P, PA-C        Allergies: Allergies  Allergen Reactions  . Penicillins     Social History: Social History  Substance Use Topics  . Smoking status: Current Every Day Smoker     Packs/day: 1.00    Years: 5.00    Types: Cigarettes  . Smokeless tobacco: Not on file  . Alcohol use Yes    Family History History reviewed. No pertinent family history.  Review of Systems 10 systems were reviewed and are negative except as noted specifically in the HPI.  Objective: Vital signs in last 24 hours: BP (!) 93/46   Pulse 80   Temp 97.6 F (36.4 C) (Oral)   Resp (!) 21   Ht 6' (1.829 m)   Wt 108.3 kg (238 lb 12.1 oz)   SpO2 94%   BMI 32.38 kg/m   Intake/Output last 3 shifts: I/O last 3 completed shifts: In: 2909.8 [I.V.:2004.8; NG/GT:855; IV Piggyback:50] Out: 4331 [Urine:5; Other:4326]  Physical Exam General: acute distress, slurred speech HEENT: [place]/AT Pulmonary: Normal work of breathing on O2 Cardiovascular: Bradycardic, arousable. Abdomen: soft, nondistended GU: Active bleeding from the urethral meatus, nonsalvageable urethral tissue extruding from the meatus Extremities: warm and well perfused, no edema Neuro: agitated, delirious  Most Recent Labs: Lab Results  Component Value Date   WBC 15.0 (H) 07/12/2016   HGB 15.5 07/12/2016   HCT 48.7 07/12/2016   PLT 126 (L) 07/12/2016    Lab Results  Component Value Date   NA 138 07/13/2016   K 4.7 07/13/2016   CL 101 07/13/2016   CO2 28 07/13/2016   BUN 45 (H) 07/13/2016   CREATININE 4.10 (H) 07/13/2016   CALCIUM 8.6 (L) 07/13/2016   MG 2.8 (H) 07/13/2016   PHOS 4.4 07/13/2016    Lab Results  Component Value Date   ALKPHOS 104 07/12/2016   BILITOT 0.8 07/12/2016   BILIDIR 0.3 07/12/2016   PROT 6.3 (L) 07/12/2016   ALBUMIN 2.7 (L) 07/13/2016   ALT 1,785 (H) 07/12/2016   AST 664 (H) 07/12/2016    Lab Results  Component Value Date   INR 1.07 07/13/2016   APTT 117 (H) 07/13/2016    IMAGING: Dg Chest Port 1 View  Result Date: 07/12/2016 CLINICAL DATA:  Acute respiratory failure EXAM: PORTABLE CHEST 1 VIEW COMPARISON:  07/11/2016 and prior radiographs FINDINGS: An endotracheal tube  with tip 4 cm above the carina, NG tube entering stomach with tip off the field of view, right central venous catheter with tip overlying the mid SVC and left IJ central venous catheter with tip overlying the lower SVC again noted. This is a low volume film with mild bibasilar atelectasis again noted. There is no evidence of pneumothorax or definite pleural effusion. IMPRESSION: Unchanged appearance of the chest. Electronically Signed   By: Harmon PierJeffrey  Hu M.D.   On: 07/12/2016 07:11    Assessment: Patient is a 21 y.o. male with drug overdose who traumatically removed his foley catheter. A foley was placed at bedside by me with a  22Fr Council tip catheter with 30 cc in the balloon. Please see separately dictated procedure note for details.   Recommendations: 1. Foley catheter to drainage. DO NOT remove without discussing with urology. 2. Irrigate catheter gently with 60cc water through a catheter tip syringe PRN progressive gross hematuria, low urine output, suprapubic distention or tenderness to palpation.  3. Urology follow-up TBD.   Discussed with Dr. Marlou Porch.  Thank you for this consult. Please do not hesitate to contact us with any further questions/concerns.  Buck Mam, MD PGY4 Urology Resident

## 2016-07-13 NOTE — Progress Notes (Signed)
eLink Physician-Brief Progress Note Patient Name: Blake BoutonLindon Kramer DOB: Jun 06, 1995 MRN: [id number]   Date of Service  07/13/2016  HPI/Events of Note  Severe agitation despite precedex/prn fentanyl  eICU Interventions  Low dose prn ativan     Intervention Category Major Interventions: Change in mental status - evaluation and management  Max FickleDouglas Tania Perrott 07/13/2016, 9:15 PM

## 2016-07-14 ENCOUNTER — Inpatient Hospital Stay (HOSPITAL_COMMUNITY): Payer: BLUE CROSS/BLUE SHIELD

## 2016-07-14 LAB — CBC
HCT: 36 % — ABNORMAL LOW (ref 39.0–52.0)
HEMOGLOBIN: 11.8 g/dL — AB (ref 13.0–17.0)
MCH: 29.3 pg (ref 26.0–34.0)
MCHC: 32.8 g/dL (ref 30.0–36.0)
MCV: 89.3 fL (ref 78.0–100.0)
PLATELETS: 156 10*3/uL (ref 150–400)
RBC: 4.03 MIL/uL — AB (ref 4.22–5.81)
RDW: 14.8 % (ref 11.5–15.5)
WBC: 19.4 10*3/uL — AB (ref 4.0–10.5)

## 2016-07-14 LAB — RENAL FUNCTION PANEL
ALBUMIN: 2.5 g/dL — AB (ref 3.5–5.0)
Albumin: 2.6 g/dL — ABNORMAL LOW (ref 3.5–5.0)
Anion gap: 11 (ref 5–15)
Anion gap: 9 (ref 5–15)
BUN: 48 mg/dL — AB (ref 6–20)
BUN: 39 mg/dL — AB (ref 6–20)
CALCIUM: 8.5 mg/dL — AB (ref 8.9–10.3)
CHLORIDE: 102 mmol/L (ref 101–111)
CO2: 24 mmol/L (ref 22–32)
CO2: 26 mmol/L (ref 22–32)
CREATININE: 4.22 mg/dL — AB (ref 0.61–1.24)
Calcium: 8.4 mg/dL — ABNORMAL LOW (ref 8.9–10.3)
Chloride: 103 mmol/L (ref 101–111)
Creatinine, Ser: 3.3 mg/dL — ABNORMAL HIGH (ref 0.61–1.24)
GFR calc Af Amer: 22 mL/min — ABNORMAL LOW (ref >60)
GFR calc Af Amer: 29 mL/min — ABNORMAL LOW (ref >60)
GFR calc non Af Amer: 19 mL/min — ABNORMAL LOW (ref >60)
GFR calc non Af Amer: 25 mL/min — ABNORMAL LOW (ref >60)
GLUCOSE: 97 mg/dL (ref 65–99)
Glucose, Bld: 109 mg/dL — ABNORMAL HIGH (ref 65–99)
POTASSIUM: 4.6 mmol/L (ref 3.5–5.1)
Phosphorus: 4 mg/dL (ref 2.5–4.6)
Phosphorus: 3.7 mg/dL (ref 2.5–4.6)
Potassium: 4.6 mmol/L (ref 3.5–5.1)
SODIUM: 138 mmol/L (ref 135–145)
Sodium: 137 mmol/L (ref 135–145)

## 2016-07-14 LAB — GLUCOSE, CAPILLARY
GLUCOSE-CAPILLARY: 91 mg/dL (ref 65–99)
Glucose-Capillary: 103 mg/dL — ABNORMAL HIGH (ref 65–99)
Glucose-Capillary: 67 mg/dL (ref 65–99)
Glucose-Capillary: 76 mg/dL (ref 65–99)
Glucose-Capillary: 96 mg/dL (ref 65–99)
Glucose-Capillary: 98 mg/dL (ref 65–99)

## 2016-07-14 LAB — CULTURE, BLOOD (ROUTINE X 2)
CULTURE: NO GROWTH
Culture: NO GROWTH
SPECIAL REQUESTS: ADEQUATE
Special Requests: ADEQUATE

## 2016-07-14 LAB — MAGNESIUM: Magnesium: 2.9 mg/dL — ABNORMAL HIGH (ref 1.7–2.4)

## 2016-07-14 LAB — POCT ACTIVATED CLOTTING TIME
ACTIVATED CLOTTING TIME: 202 s
ACTIVATED CLOTTING TIME: 219 s
ACTIVATED CLOTTING TIME: 219 s
ACTIVATED CLOTTING TIME: 219 s
ACTIVATED CLOTTING TIME: 230 s
ACTIVATED CLOTTING TIME: 230 s
ACTIVATED CLOTTING TIME: 235 s
Activated Clotting Time: 158 seconds
Activated Clotting Time: 191 seconds
Activated Clotting Time: 202 seconds
Activated Clotting Time: 224 seconds
Activated Clotting Time: 224 seconds
Activated Clotting Time: 235 seconds
Activated Clotting Time: 230 seconds

## 2016-07-14 LAB — APTT: APTT: 74 s — AB (ref 24–36)

## 2016-07-14 LAB — PHOSPHORUS: Phosphorus: 4 mg/dL (ref 2.5–4.6)

## 2016-07-14 NOTE — Progress Notes (Signed)
Subjective: Interval History: cont to be severely combative, confused. Requires much sedation.  Objective: Vital signs in last 24 hours: Temp:  [97.6 F (36.4 C)-98.9 F (37.2 C)] 97.9 F (36.6 C) (06/12 0339) Pulse Rate:  [69-125] 69 (06/12 0545) Resp:  [14-60] 17 (06/12 0545) BP: (72-112)/(33-76) 111/72 (06/12 0545) SpO2:  [87 %-100 %] 99 % (06/12 0545) Weight:  [106.2 kg (234 lb 2.1 oz)] 106.2 kg (234 lb 2.1 oz) (06/12 0330) Weight change: -2.1 kg (-4 lb 10.1 oz)  Intake/Output from previous day: 06/11 0701 - 06/12 0700 In: 1060.8 [I.V.:1010.8; IV Piggyback:50] Out: 1163  Intake/Output this shift: No intake/output data recorded.  General appearance: moans, off vent, no coop Neck: Kramer IJ cath Resp: rhonchi bibasilar and wheezes LLL Cardio: S1, S2 normal and systolic murmur: holosystolic 2/6, blowing at apex GI: obese , pos bs soft.  Extremities: edema 1+  Lab Results:  Recent Labs  07/13/16 1400 07/14/16 0405  WBC 15.9* 19.4*  HGB 12.8* 11.8*  HCT 40.0 36.0*  PLT 115* 156   BMET:  Recent Labs  07/13/16 1600 07/14/16 0405  NA 138 137  K 5.0 4.6  CL 102 102  CO2 26 24  GLUCOSE 99 109*  BUN 36* 48*  CREATININE 3.34* 4.22*  CALCIUM 8.6* 8.4*   No results for input(s): PTH in the last 72 hours. Iron Studies: No results for input(s): IRON, TIBC, TRANSFERRIN, FERRITIN in the last 72 hours.  Studies/Results: Dg Chest Port 1 View  Result Date: 07/14/2016 CLINICAL DATA:  Central line placement EXAM: PORTABLE CHEST 1 VIEW COMPARISON:  07/12/2016 FINDINGS: Endotracheal tube has been removed. Left-sided central venous catheter tip overlies the distal SVC. Right sided central venous catheter tip also overlies SVC. Esophageal tube has been removed. Mildly low lung volumes. No focal consolidation or effusion. Normal heart size. No pneumothorax. IMPRESSION: Removal of endotracheal and esophageal tubes. Tips of the central venous catheters overlie the SVC. Low lung volume  but no pneumothorax or infiltrate. Electronically Signed   By: Jasmine PangKim  Fujinaga M.D.   On: 07/14/2016 00:21    I have reviewed the patient's current medications.  Assessment/Plan: 1 AKI oliguric ATN .Marland Kitchen. Solute ^ after cath not functional for a while.  Acid/base/k/vol ok.   2 Drug OD 3 Enceph  Drug withdrawal, personality, ? Anoxic injury 4 ^ WBC follow P Cont CRRT, if can get off pressors , will convert to IHD, behavior limits options.      LOS: 5 days   Blake Kramer 07/14/2016,7:21 AM

## 2016-07-14 NOTE — Progress Notes (Signed)
Irrigated patient's foley with 60 cc sterile NS per orders for no urine output . 40 cc of amber blood tinged urine and irrigant returned. No clots noted and catheter flushes easily. Bladder scan at bedside with 14-20 mls noted on repeated scans.

## 2016-07-14 NOTE — Progress Notes (Signed)
PULMONARY / CRITICAL CARE MEDICINE   Name: Blake Kramer MRN: [id number] DOB: 1995-09-22    ADMISSION DATE:  07/09/2016 CONSULTATION DATE:  07/09/16  [facility]  CHIEF COMPLAINT:  Overdose  BRIEF SUMMARY:  Blake Kramer is a 21 y.o. man with no known PMH.  [facility] [place] after presumed drug overdose.  EMS was dispatched to pt's location where they found him lying supine on the floor, pale, diaphoretic.  Girlfriend informed EMS that she thought pt had might have been down for roughly 10 hours when she previously saw him.  Per police, there had been several cases of fentanyl and heroine overdoses in the community and per their report, scene looked very similar; therefore, they were suspecting the same in this instance. He was given 3 rounds of narcan with little response.  In ED, he was intubated for airway protection.  Labs revealed multiple metabolic derangements including Lactate 10.4, SCr 4.4, K 7.1, AST 1453, ALT 1008, AG 23, WBC 18.9, Hgb 17.8, Trop 4.48.  UDS positive for opiates.  CT head negative.  [facility].  SUBJECTIVE:   Urology was able to place 3 way catheter 6/11 Pt has continued to be agitated, although will speak and interact His HD catheter was kinked and not working 6/11 >> replaced  On more sedation  Oliguric   VITAL SIGNS: BP (!) 77/58   Pulse 70   Temp 98 F (36.7 C) (Oral)   Resp 15   Ht 6' (1.829 m)   Wt 106.2 kg (234 lb 2.1 oz)   SpO2 100%   BMI 31.75 kg/m   HEMODYNAMICS:    VENTILATOR SETTINGS:    INTAKE / OUTPUT: I/O last 3 completed shifts: In: 1423.6 [I.V.:1373.6; IV Piggyback:50] Out: 1994 [Other:1994]   PHYSICAL EXAMINATION: General: Ill-appearing, intermittently agitated but currently sedated HEENT: Oropharynx clear Neuro: Wakes to voice and has slurred speech, moves all extremities, Precedex running CV: Regular, no murmur PULM: Coarse  bilaterally, strong cough GI: Soft, benign GU: Three-way Foley catheter in place Extremities: No significant edema Skin: No rash   LABS:  BMET  Recent Labs Lab 07/13/16 0420 07/13/16 1600 07/14/16 0405  NA 138 138 137  K 4.7 5.0 4.6  CL 101 102 102  CO2 28 26 24   BUN 45* 36* 48*  CREATININE 4.10* 3.34* 4.22*  GLUCOSE 101* 99 109*    Electrolytes  Recent Labs Lab 07/12/16 0410  07/13/16 0420 07/13/16 1600 07/13/16 2006 07/14/16 0405  CALCIUM  --   < > 8.6* 8.6*  --  8.4*  MG 2.6*  --  2.8*  --   --  2.9*  PHOS 4.8*  < > 4.4 3.6 4.0 4.0  4.0  < > = values in this interval not displayed.  CBC  Recent Labs Lab 07/12/16 0410 07/13/16 1400 07/14/16 0405  WBC 15.0* 15.9* 19.4*  HGB 15.5 12.8* 11.8*  HCT 48.7 40.0 36.0*  PLT 126* 115* 156    Coag's  Recent Labs Lab 07/12/16 0410 07/13/16 0420 07/14/16 0405  APTT 81* 117* 74*  INR  --  1.07  --     Sepsis Markers  Recent Labs Lab 07/09/16 0755 07/09/16 1955  LATICACIDVEN 2.2* 1.8  PROCALCITON 41.54  --     ABG  Recent Labs Lab 07/09/16 0930 07/10/16 0430  PHART 7.287* 7.353  PCO2ART 45.1 39.8  PO2ART 132* 70.8*    Liver Enzymes  Recent Labs Lab 07/10/16 0845  07/11/16 0645  07/12/16 0410  07/13/16 0420 07/13/16 1600 07/14/16 0405  AST 2,727*  --  1,359*  --  664*  --   --   --   --   ALT 3,495*  --  2,794*  --  1,785*  --   --   --   --   ALKPHOS 94  --  102  --  104  --   --   --   --   BILITOT 1.4*  --  0.8  --  0.8  --   --   --   --   ALBUMIN 2.9*  < > 2.7*  2.8*  < > 2.6*  < > 2.7* 2.7* 2.6*  < > = values in this interval not displayed.  Cardiac Enzymes  Recent Labs Lab 07/09/16 0755 07/09/16 1955 07/10/16 0845  TROPONINI 16.84* 24.10* 9.78*    Glucose  Recent Labs Lab 07/13/16 1133 07/13/16 2010 07/14/16 0030 07/14/16 0414 07/14/16 0739 07/14/16 1144  GLUCAP 78 92 98 103* 91 67    Imaging Dg Chest Port 1 View  Result Date: 07/14/2016 CLINICAL  DATA:  Central line placement EXAM: PORTABLE CHEST 1 VIEW COMPARISON:  07/12/2016 FINDINGS: Endotracheal tube has been removed. Left-sided central venous catheter tip overlies the distal SVC. Right sided central venous catheter tip also overlies SVC. Esophageal tube has been removed. Mildly low lung volumes. No focal consolidation or effusion. Normal heart size. No pneumothorax. IMPRESSION: Removal of endotracheal and esophageal tubes. Tips of the central venous catheters overlie the SVC. Low lung volume but no pneumothorax or infiltrate. Electronically Signed   By: Jasmine Pang M.D.   On: 07/14/2016 00:21     STUDIES:  CT head 6/7 > negative. Echo 6/7 >> LV moderately dilated, mild LVH, LVEF 25-30%, diffuse hypokinesis Renal US 6/7 >> no obstructive uropathy, increased renal cortical echogenicity as seen with AKI Hepatitis Panel 6/7 >> negative  HIV 6/7 >> non-reactive  CULTURES: Blood 6/7 >>  ANTIBIOTICS: Rocephin 6/8 >>   SIGNIFICANT EVENTS: 6/07  Admit with presumed overdose, CVVHD started  6/08  Remains obtunded 6/11 self extubated 6/11 urethral injury after traumatic removal of Foley catheter  LINES/TUBES: ETT 6/7 >> 6/11 R IJ CVL 6/7 >>  L IJ HD 6/7 >>   DISCUSSION: 21 y.o. male transferred from [place] after presumed heroin and fentanyl overdose.  Down for possibly 10 hours. Was intubated with rhabdo, hyperkalemia, AKI, troponin bump. Neurologically recovering to some degree, able to speak. Self extubated 6/11. Urethral injury and brisk bleeding after traumatic Foley removal by the patient 6/11  ASSESSMENT / PLAN:  PULMONARY A: Respiratory insufficiency - s/p intubation due to inability to protect the airway in the setting of drug overdose. Probable aspiration pneumonia versus pneumonitis P:   Continue pulmonary hygiene Intermittent chest x-ray Complete full course of antibiotics as below   CARDIOVASCULAR A:  Troponin bump - ? Demand after prolonged  downtime.  Also consider NSTEMI from drug overdose. Dilated Cardiomyopathy P:  Follow telemetry, hemodynamics,  plan to repeat echocardiogram in several weeks to look for interval improvement  RENAL A:   Hyperkalemia. Acute renal failure Rhabdo, CK beginning to clear on 6/9 AGMA - lactate. P:   Continue CVVH per nephrology recommendations Follow BMP, urine output Replace electrolytes as indicated  GASTROINTESTINAL A:   Shock Liver / Transaminitis - ? Due to prolonged downtime.  Also consider acute hepatitis given drug abuse and unknown hx of  needle use, etc. improving GI prophylaxis. Nutrition. P:   Speech therapy evaluation when he is able to participate, currently too agitated Defer placement of NG tube at this time as he will certainly remove it  GU:  A: Urethral trauma from self-foley removal, significant bleeding.  P:  Three-way irrigation catheter placed by urology, appreciate assistance. He will likely need to stay for prolonged period of time to tamponade and also to allow healing of urethral disruption DO NOT remove Foley catheter without speaking w Urology first  HEMATOLOGIC A:   Acute blood loss due to urethral trauma Thrombocytopenia  VTE Prophylaxis. P:  Follow CBC Heparin subcutaneous and through CVVHD  INFECTIOUS A:   Fever - 102 at [facility], 1/4 blood cultures at [facility] with GPC's, thick yellow oral secretions P:   Continue empiric ceftriaxone as ordered Follow culture data including from outside hospital  ENDOCRINE A:   Hypoglycemia - in setting of liver dysfunction P:   Follow CBG  NEUROLOGIC A:   Acute encephalopathy - due to presumed drug overdose + sedation. Presumed heroine and fentanyl overdose. P:   RASS Goal: 0 Work on Arts administrator as we are able, frequent orientation Haldol as needed Fentanyl as needed   Family updated: Mother updated at bedside 6/8.  Interdisciplinary Family Meeting v Palliative Care Meeting:   Due by: 07/15/16.   Independent critical care time is 32 minutes.   Levy Pupa, MD, PhD 07/14/2016, 1:37 PM [facility] [phone] or if no answer [phone]

## 2016-07-14 NOTE — Care Management Note (Signed)
Case Management Note  Patient Details  Name: Blake Kramer MRN: [id number] Date of Birth: 04-01-1995  Subjective/Objective:      Pt admitted post drug overdose              Action/Plan:  Pt remains intubated and on CRRT.  CSW consulted for substance abuse   Expected Discharge Date:                  Expected Discharge Plan:     In-House Referral:  Clinical Social Work  Discharge planning Services     Post Acute Care Choice:    Choice offered to:     DME Arranged:    DME Agency:     HH Arranged:    HH Agency:     Status of Service:     If discussed at [employer] Meetings, dates discussed:    Additional Comments: 07/14/2016 Pt remains on CRRT - now extubated.  Remains combative and agitated and therefore on continuous Precedex.  Discussed in LOS 6/12 - pt remains appropriate for continued stay Cherylann ParrClaxton, Luci Bellucci S, RN 07/14/2016, 11:01 AM

## 2016-07-15 ENCOUNTER — Inpatient Hospital Stay (HOSPITAL_COMMUNITY): Payer: BLUE CROSS/BLUE SHIELD

## 2016-07-15 DIAGNOSIS — I5030 Unspecified diastolic (congestive) heart failure: Secondary | ICD-10-CM

## 2016-07-15 LAB — CBC
HEMATOCRIT: 34.8 % — AB (ref 39.0–52.0)
HEMOGLOBIN: 11.2 g/dL — AB (ref 13.0–17.0)
MCH: 29 pg (ref 26.0–34.0)
MCHC: 32.2 g/dL (ref 30.0–36.0)
MCV: 90.2 fL (ref 78.0–100.0)
Platelets: 159 10*3/uL (ref 150–400)
RBC: 3.86 MIL/uL — ABNORMAL LOW (ref 4.22–5.81)
RDW: 14.9 % (ref 11.5–15.5)
WBC: 17 10*3/uL — ABNORMAL HIGH (ref 4.0–10.5)

## 2016-07-15 LAB — RENAL FUNCTION PANEL
Albumin: 2.6 g/dL — ABNORMAL LOW (ref 3.5–5.0)
Anion gap: 7 (ref 5–15)
BUN: 28 mg/dL — AB (ref 6–20)
CHLORIDE: 103 mmol/L (ref 101–111)
CO2: 26 mmol/L (ref 22–32)
CREATININE: 2.63 mg/dL — AB (ref 0.61–1.24)
Calcium: 8.6 mg/dL — ABNORMAL LOW (ref 8.9–10.3)
GFR calc Af Amer: 39 mL/min — ABNORMAL LOW (ref >60)
GFR calc non Af Amer: 33 mL/min — ABNORMAL LOW (ref >60)
GLUCOSE: 96 mg/dL (ref 65–99)
Phosphorus: 3.2 mg/dL (ref 2.5–4.6)
Potassium: 4.4 mmol/L (ref 3.5–5.1)
Sodium: 136 mmol/L (ref 135–145)

## 2016-07-15 LAB — COMPREHENSIVE METABOLIC PANEL
ALBUMIN: 2.7 g/dL — AB (ref 3.5–5.0)
ALK PHOS: 79 U/L (ref 38–126)
ALT: 536 U/L — ABNORMAL HIGH (ref 17–63)
AST: 246 U/L — AB (ref 15–41)
Anion gap: 10 (ref 5–15)
BILIRUBIN TOTAL: 0.8 mg/dL (ref 0.3–1.2)
BUN: 31 mg/dL — AB (ref 6–20)
CALCIUM: 8.6 mg/dL — AB (ref 8.9–10.3)
CO2: 25 mmol/L (ref 22–32)
Chloride: 101 mmol/L (ref 101–111)
Creatinine, Ser: 2.84 mg/dL — ABNORMAL HIGH (ref 0.61–1.24)
GFR calc Af Amer: 35 mL/min — ABNORMAL LOW (ref >60)
GFR, EST NON AFRICAN AMERICAN: 30 mL/min — AB (ref >60)
GLUCOSE: 93 mg/dL (ref 65–99)
Potassium: 4.8 mmol/L (ref 3.5–5.1)
Sodium: 136 mmol/L (ref 135–145)
TOTAL PROTEIN: 6.4 g/dL — AB (ref 6.5–8.1)

## 2016-07-15 LAB — GLUCOSE, CAPILLARY
GLUCOSE-CAPILLARY: 83 mg/dL (ref 65–99)
GLUCOSE-CAPILLARY: 72 mg/dL (ref 65–99)
Glucose-Capillary: 146 mg/dL — ABNORMAL HIGH (ref 65–99)
Glucose-Capillary: 68 mg/dL (ref 65–99)
Glucose-Capillary: 69 mg/dL (ref 65–99)
Glucose-Capillary: 77 mg/dL (ref 65–99)
Glucose-Capillary: 82 mg/dL (ref 65–99)

## 2016-07-15 LAB — POCT ACTIVATED CLOTTING TIME
ACTIVATED CLOTTING TIME: 186 s
ACTIVATED CLOTTING TIME: 191 s
ACTIVATED CLOTTING TIME: 197 s
ACTIVATED CLOTTING TIME: 213 s
ACTIVATED CLOTTING TIME: 219 s
ACTIVATED CLOTTING TIME: 219 s
ACTIVATED CLOTTING TIME: 224 s
Activated Clotting Time: 219 seconds
Activated Clotting Time: 202 seconds
Activated Clotting Time: 186 seconds
Activated Clotting Time: 191 seconds
Activated Clotting Time: 186 seconds
Activated Clotting Time: 197 seconds
Activated Clotting Time: 191 seconds

## 2016-07-15 LAB — ECHOCARDIOGRAM LIMITED
HEIGHTINCHES: 72 in
WEIGHTICAEL: 3717.84 [oz_av]

## 2016-07-15 LAB — APTT: aPTT: 114 seconds — ABNORMAL HIGH (ref 24–36)

## 2016-07-15 LAB — MAGNESIUM: Magnesium: 2.8 mg/dL — ABNORMAL HIGH (ref 1.7–2.4)

## 2016-07-15 LAB — PHOSPHORUS
PHOSPHORUS: 3.6 mg/dL (ref 2.5–4.6)
Phosphorus: 3.2 mg/dL (ref 2.5–4.6)

## 2016-07-15 MED ORDER — DEXTROSE 50 % IV SOLN
25.0000 mL | Freq: Once | INTRAVENOUS | Status: AC
  Administered 2016-07-15: 25 mL via INTRAVENOUS

## 2016-07-15 MED ORDER — MORPHINE SULFATE (PF) 2 MG/ML IV SOLN
2.0000 mg | INTRAVENOUS | Status: DC | PRN
  Administered 2016-07-15 – 2016-07-17 (×11): 2 mg via INTRAVENOUS
  Filled 2016-07-15 (×11): qty 1

## 2016-07-15 MED ORDER — LORAZEPAM 2 MG/ML IJ SOLN
1.0000 mg | INTRAMUSCULAR | Status: DC | PRN
  Administered 2016-07-16 – 2016-07-22 (×12): 1 mg via INTRAVENOUS
  Filled 2016-07-15 (×12): qty 1

## 2016-07-15 MED ORDER — DEXTROSE 50 % IV SOLN
INTRAVENOUS | Status: AC
  Filled 2016-07-15: qty 50

## 2016-07-15 NOTE — Progress Notes (Signed)
Hypoglycemic Event  CBG: 69  Treatment: D50 IV 25 mL  Symptoms: None  Follow-up CBG: Time:0042 CBG Result:146   Possible Reasons for Event: Unknown     Blake Kramer

## 2016-07-15 NOTE — Progress Notes (Signed)
PULMONARY / CRITICAL CARE MEDICINE   Name: Blake BoutonLindon Coryell MRN: [id number] DOB: [birth year]    ADMISSION DATE:  07/09/2016 CONSULTATION DATE:  07/09/16  [facility]  [facility]  CHIEF COMPLAINT:  Overdose  BRIEF SUMMARY:  Blake Kramer is a 21 y.o. man with no known PMH.  [facility] [place] after presumed drug overdose.  EMS was dispatched to pt's location where they found him lying supine on the floor, pale, diaphoretic.  Girlfriend informed EMS that she thought pt had might have been down for roughly 10 hours when she previously saw him.  Per police, there had been several cases of fentanyl and heroine overdoses in the community and per their report, scene looked very similar; therefore, they were suspecting the same in this instance. He was given 3 rounds of narcan with little response.  In ED, he was intubated for airway protection.  Labs revealed multiple metabolic derangements including Lactate 10.4, SCr 4.4, K 7.1, AST 1453, ALT 1008, AG 23, WBC 18.9, Hgb 17.8, Trop 4.48.  UDS positive for opiates.  CT head negative.  [facility].  SUBJECTIVE:   Appears to be a little calmer this morning, Precedex still running. Has required intermittent additional sedation 55 mL of urine recorded over the last 24 hours States that he wants something to drink  VITAL SIGNS: BP (!) 99/45   Pulse 83   Temp 98.5 F (36.9 C) (Axillary)   Resp (!) 21   Ht 6' (1.829 m)   Wt 105.4 kg (232 lb 5.8 oz)   SpO2 97%   BMI 31.51 kg/m   HEMODYNAMICS:    VENTILATOR SETTINGS:    INTAKE / OUTPUT: I/O last 3 completed shifts: In: 1849.5 [I.V.:1739.5; Other:60; IV Piggyback:50] Out: 2076 [Urine:55; Other:2021]   PHYSICAL EXAMINATION: General: Ill-appearing young man, a bit more directable and comfortable today HEENT: Oropharynx clear Neuro: Wakes to voice more easily, clear speech, less impulsive good strength in all  extremities CV: Regular, no murmur PULM: Clearing bilaterally, no wheezing, strong cough GI: Soft, benign, positive bowel sounds GU: Three-way Foley catheter in place Extremities: No significant lower extremity edema Skin: No rash   LABS:  BMET  Recent Labs Lab 07/14/16 0405 07/14/16 1551 07/15/16 0425  NA 137 138 136  K 4.6 4.6 4.8  CL 102 103 101  CO2 24 26 25   BUN 48* 39* 31*  CREATININE 4.22* 3.30* 2.84*  GLUCOSE 109* 97 93    Electrolytes  Recent Labs Lab 07/13/16 0420  07/14/16 0405 07/14/16 1551 07/15/16 0425  CALCIUM 8.6*  < > 8.4* 8.5* 8.6*  MG 2.8*  --  2.9*  --  2.8*  PHOS 4.4  < > 4.0  4.0 3.7 3.6  < > = values in this interval not displayed.  CBC  Recent Labs Lab 07/13/16 1400 07/14/16 0405 07/15/16 0425  WBC 15.9* 19.4* 17.0*  HGB 12.8* 11.8* 11.2*  HCT 40.0 36.0* 34.8*  PLT 115* 156 159    Coag's  Recent Labs Lab 07/13/16 0420 07/14/16 0405 07/15/16 0425  APTT 117* 74* 114*  INR 1.07  --   --     Sepsis Markers  Recent Labs Lab 07/09/16 0755 07/09/16 1955  LATICACIDVEN 2.2* 1.8  PROCALCITON 41.54  --     ABG  Recent Labs Lab 07/09/16 0930 07/10/16 0430  PHART 7.287* 7.353  PCO2ART 45.1 39.8  PO2ART 132* 70.8*    Liver Enzymes  Recent Labs Lab 07/11/16 0645  07/12/16 0410  07/14/16 0405 07/14/16 1551 07/15/16 0425  AST 1,359*  --  664*  --   --   --  246*  ALT 2,794*  --  1,785*  --   --   --  536*  ALKPHOS 102  --  104  --   --   --  79  BILITOT 0.8  --  0.8  --   --   --  0.8  ALBUMIN 2.7*  2.8*  < > 2.6*  < > 2.6* 2.5* 2.7*  < > = values in this interval not displayed.  Cardiac Enzymes  Recent Labs Lab 07/09/16 0755 07/09/16 1955 07/10/16 0845  TROPONINI 16.84* 24.10* 9.78*    Glucose  Recent Labs Lab 07/14/16 1545 07/14/16 1950 07/15/16 0021 07/15/16 0042 07/15/16 0439 07/15/16 0713  GLUCAP 96 76 69 146* 83 77    Imaging No results found.   STUDIES:  CT head 6/7 >  negative. Echo 6/7 >> LV moderately dilated, mild LVH, LVEF 25-30%, diffuse hypokinesis Renal US 6/7 >> no obstructive uropathy, increased renal cortical echogenicity as seen with AKI Hepatitis Panel 6/7 >> negative  HIV 6/7 >> non-reactive  CULTURES: Blood 6/7 >>  ANTIBIOTICS: Rocephin 6/8 >>   SIGNIFICANT EVENTS: 6/07  Admit with presumed overdose, CVVHD started  6/08  Remains obtunded 6/11 self extubated 6/11 urethral injury after traumatic removal of Foley catheter  LINES/TUBES: ETT 6/7 >> 6/11 R IJ CVL 6/7 >>  L IJ HD 6/7 >>   DISCUSSION: 21 y.o. male transferred from [place] after presumed heroin and fentanyl overdose.  Down for possibly 10 hours. Was intubated with rhabdo, hyperkalemia, AKI, troponin bump. Neurologically recovering to some degree, able to speak. Self extubated 6/11. Urethral injury and brisk bleeding after traumatic Foley removal by the patient 6/11  ASSESSMENT / PLAN:  PULMONARY A: Respiratory insufficiency - s/p intubation due to inability to protect the airway in the setting of drug overdose. Probable aspiration pneumonia versus pneumonitis P:   Continue pulmonary hygiene Intermittent chest x-ray Complete full course of antibiotics as detailed below   CARDIOVASCULAR A:  Troponin bump - ? Demand after prolonged downtime.  Also consider NSTEMI from drug overdose. Dilated Cardiomyopathy P:  Follow telemetry, hemodynamics We will repeat echocardiogram in the next several weeks to look for interval improvement, question stunning  RENAL A:   Hyperkalemia. Acute renal failure Rhabdo, CK beginning to clear on 6/9 AGMA - lactate. P:   Continues to be oliguric but has had over 50 mL urine over the last 24 hours. Question whether he may have a renal recovery Continue CVVHD per nephrology recommendations Follow BMP, urine output Replace electrolytes as indicated  GASTROINTESTINAL A:   Shock Liver / Transaminitis - ? Due to prolonged  downtime.  Also consider acute hepatitis given drug abuse and unknown hx of needle use, etc. improving GI prophylaxis. Nutrition. P:   Significant improvement and LFT We will try to give him some liquids by mouth, see if he tolerates. Possibly advance diet Consider speech therapy evaluation when he can participate Defer placement of NG tube as I believe it will increase his agitation  GU:  A: Urethral trauma from self-foley removal, significant bleeding.  P:  Three-way irrigation catheter has been placed by urology, greatly appreciate their assistance. Suspect that he will require this for extended period given the urethral disruption and the need to tamponade. Absolutely do not remove Foley catheter without speaking with urology  first.  HEMATOLOGIC A:   Acute blood loss due to urethral trauma Thrombocytopenia  VTE Prophylaxis. P:  Follow CBC Subcutaneous heparin and also with CVVHD  INFECTIOUS A:   Fever - 102 at [facility], 1/4 blood cultures at [facility] with GPC's, thick yellow oral secretions P:   Day 7 of 8 of ceftriaxone on 6/13 Follow culture data including from outside hospital  ENDOCRINE A:   Hypoglycemia - in setting of liver dysfunction P:   Follow CBG  NEUROLOGIC A:   Acute encephalopathy - due to presumed drug overdose + sedation. Presumed heroine and fentanyl overdose. P:   RASS Goal: 0 Continue to work on Arts administrator as able, frequent orientation Ativan as needed Morphine as needed DC Haldol   Family updated: Mother updated at bedside 6/8.  Interdisciplinary Family Meeting v Palliative Care Meeting:  Due by: 07/15/16.   Independent critical care time is 32 minutes.   Levy Pupa, MD, PhD 07/15/2016, 10:02 AM [place] [facility] [phone] or if no answer [phone]

## 2016-07-15 NOTE — Progress Notes (Signed)
Subjective: Interval History: still confused, halluc, requiring large amt sedation.  Objective: Vital signs in last 24 hours: Temp:  [97.3 F (36.3 C)-98.5 F (36.9 C)] 97.6 F (36.4 C) (06/13 0430) Pulse Rate:  [63-96] 71 (06/13 0630) Resp:  [13-26] 18 (06/13 0630) BP: (76-132)/(38-112) 102/46 (06/13 0630) SpO2:  [93 %-100 %] 100 % (06/13 0630) Weight:  [105.4 kg (232 lb 5.8 oz)] 105.4 kg (232 lb 5.8 oz) (06/13 0415) Weight change: -0.8 kg (-1 lb 12.2 oz)  Intake/Output from previous day: 06/12 0701 - 06/13 0700 In: 1212.2 [I.V.:1102.2; IV Piggyback:50] Out: 1353 [Urine:55] Intake/Output this shift: Total I/O In: 13.2 [I.V.:13.2] Out: -   General appearance: moderately obese, pale, uncooperative and yelling out Neck: yelling out Resp: rhonchi bibasilar Cardio: S1, S2 normal and systolic murmur: holosystolic 2/6, blowing at apex GI: pos bs, obese, liver down 5 cm Extremities: track, marks  Lab Results:  Recent Labs  07/14/16 0405 07/15/16 0425  WBC 19.4* 17.0*  HGB 11.8* 11.2*  HCT 36.0* 34.8*  PLT 156 159   BMET:  Recent Labs  07/14/16 1551 07/15/16 0425  NA 138 136  K 4.6 4.8  CL 103 101  CO2 26 25  GLUCOSE 97 93  BUN 39* 31*  CREATININE 3.30* 2.84*  CALCIUM 8.5* 8.6*   No results for input(s): PTH in the last 72 hours. Iron Studies: No results for input(s): IRON, TIBC, TRANSFERRIN, FERRITIN in the last 72 hours.  Studies/Results: Dg Chest Port 1 View  Result Date: 07/14/2016 CLINICAL DATA:  Central line placement EXAM: PORTABLE CHEST 1 VIEW COMPARISON:  07/12/2016 FINDINGS: Endotracheal tube has been removed. Left-sided central venous catheter tip overlies the distal SVC. Right sided central venous catheter tip also overlies SVC. Esophageal tube has been removed. Mildly low lung volumes. No focal consolidation or effusion. Normal heart size. No pneumothorax. IMPRESSION: Removal of endotracheal and esophageal tubes. Tips of the central venous catheters  overlie the SVC. Low lung volume but no pneumothorax or infiltrate. Electronically Signed   By: Jasmine PangKim  Fujinaga M.D.   On: 07/14/2016 00:21    I have reviewed the patient's current medications.  Assessment/Plan: 1 AKI oliguric rhabdo.  Good solute/acid/base/K and vol. Will try to get off NE and use IHD. 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

## 2016-07-15 NOTE — Progress Notes (Signed)
  Echocardiogram 2D Echocardiogram Limited has been performed.  Leta JunglingCooper, Abelino Tippin M 07/15/2016, 2:43 PM

## 2016-07-16 ENCOUNTER — Encounter (HOSPITAL_COMMUNITY): Payer: Self-pay

## 2016-07-16 DIAGNOSIS — N368 Other specified disorders of urethra: Secondary | ICD-10-CM

## 2016-07-16 LAB — GLUCOSE, CAPILLARY
GLUCOSE-CAPILLARY: 91 mg/dL (ref 65–99)
GLUCOSE-CAPILLARY: 127 mg/dL — AB (ref 65–99)
GLUCOSE-CAPILLARY: 107 mg/dL — AB (ref 65–99)
Glucose-Capillary: 111 mg/dL — ABNORMAL HIGH (ref 65–99)
Glucose-Capillary: 135 mg/dL — ABNORMAL HIGH (ref 65–99)
Glucose-Capillary: 76 mg/dL (ref 65–99)

## 2016-07-16 LAB — RENAL FUNCTION PANEL
ALBUMIN: 2.8 g/dL — AB (ref 3.5–5.0)
ANION GAP: 7 (ref 5–15)
Albumin: 2.6 g/dL — ABNORMAL LOW (ref 3.5–5.0)
Anion gap: 9 (ref 5–15)
BUN: 27 mg/dL — ABNORMAL HIGH (ref 6–20)
BUN: 23 mg/dL — ABNORMAL HIGH (ref 6–20)
CHLORIDE: 102 mmol/L (ref 101–111)
CO2: 27 mmol/L (ref 22–32)
CO2: 27 mmol/L (ref 22–32)
CREATININE: 2.6 mg/dL — AB (ref 0.61–1.24)
Calcium: 8.6 mg/dL — ABNORMAL LOW (ref 8.9–10.3)
Calcium: 8.4 mg/dL — ABNORMAL LOW (ref 8.9–10.3)
Chloride: 99 mmol/L — ABNORMAL LOW (ref 101–111)
Creatinine, Ser: 2.68 mg/dL — ABNORMAL HIGH (ref 0.61–1.24)
GFR calc Af Amer: 38 mL/min — ABNORMAL LOW (ref >60)
GFR calc non Af Amer: 33 mL/min — ABNORMAL LOW (ref >60)
GFR, EST AFRICAN AMERICAN: 39 mL/min — AB (ref >60)
GFR, EST NON AFRICAN AMERICAN: 34 mL/min — AB (ref >60)
GLUCOSE: 118 mg/dL — AB (ref 65–99)
Glucose, Bld: 107 mg/dL — ABNORMAL HIGH (ref 65–99)
PHOSPHORUS: 2.6 mg/dL (ref 2.5–4.6)
POTASSIUM: 4.4 mmol/L (ref 3.5–5.1)
Phosphorus: 2.8 mg/dL (ref 2.5–4.6)
Potassium: 4.6 mmol/L (ref 3.5–5.1)
SODIUM: 133 mmol/L — AB (ref 135–145)
Sodium: 138 mmol/L (ref 135–145)

## 2016-07-16 LAB — CBC
HCT: 32.7 % — ABNORMAL LOW (ref 39.0–52.0)
HEMOGLOBIN: 10.7 g/dL — AB (ref 13.0–17.0)
MCH: 29.8 pg (ref 26.0–34.0)
MCHC: 32.7 g/dL (ref 30.0–36.0)
MCV: 91.1 fL (ref 78.0–100.0)
Platelets: 165 10*3/uL (ref 150–400)
RBC: 3.59 MIL/uL — AB (ref 4.22–5.81)
RDW: 15.4 % (ref 11.5–15.5)
WBC: 14 10*3/uL — ABNORMAL HIGH (ref 4.0–10.5)

## 2016-07-16 LAB — POCT ACTIVATED CLOTTING TIME
ACTIVATED CLOTTING TIME: 197 s
Activated Clotting Time: 186 seconds
Activated Clotting Time: 191 seconds
Activated Clotting Time: 191 seconds
Activated Clotting Time: 208 seconds

## 2016-07-16 LAB — APTT: APTT: 120 s — AB (ref 24–36)

## 2016-07-16 LAB — MAGNESIUM: Magnesium: 2.7 mg/dL — ABNORMAL HIGH (ref 1.7–2.4)

## 2016-07-16 MED ORDER — BOOST / RESOURCE BREEZE PO LIQD
1.0000 | Freq: Two times a day (BID) | ORAL | Status: DC
  Administered 2016-07-18 – 2016-07-22 (×6): 1 via ORAL

## 2016-07-16 MED ORDER — PANTOPRAZOLE SODIUM 40 MG IV SOLR
40.0000 mg | INTRAVENOUS | Status: DC
  Administered 2016-07-16: 40 mg via INTRAVENOUS
  Filled 2016-07-16: qty 40

## 2016-07-16 NOTE — Progress Notes (Signed)
Subjective: Interval History: in general less sedation , on 3 mcg NE now.    Objective: Vital signs in last 24 hours: Temp:  [98.2 F (36.8 C)-100 F (37.8 C)] 98.2 F (36.8 C) (06/14 0337) Pulse Rate:  [72-116] 75 (06/14 0700) Resp:  [13-26] 17 (06/14 0700) BP: (78-118)/(34-101) 95/43 (06/14 0700) SpO2:  [93 %-100 %] 96 % (06/14 0700) Weight:  [106 kg (233 lb 11 oz)] 106 kg (233 lb 11 oz) (06/14 0500) Weight change: 0.6 kg (1 lb 5.2 oz)  Intake/Output from previous day: 06/13 0701 - 06/14 0700 In: 1437.4 [P.O.:750; I.V.:637.4; IV Piggyback:50] Out: 1459  Intake/Output this shift: No intake/output data recorded.  General appearance: moderately obese and awake, not coop, or conversant Resp: rales bibasilar and rhonchi bibasilar Cardio: S1, S2 normal and systolic murmur: holosystolic 2/6, blowing at apex GI: obese, pos bs, soft Extremities: edema tr  LIJ cath  Lab Results:  Recent Labs  07/15/16 0425 07/16/16 0357  WBC 17.0* 14.0*  HGB 11.2* 10.7*  HCT 34.8* 32.7*  PLT 159 165   BMET:  Recent Labs  07/15/16 1516 07/16/16 0359  NA 136 138  K 4.4 4.4  CL 103 102  CO2 26 27  GLUCOSE 96 107*  BUN 28* 27*  CREATININE 2.63* 2.60*  CALCIUM 8.6* 8.6*   No results for input(s): PTH in the last 72 hours. Iron Studies: No results for input(s): IRON, TIBC, TRANSFERRIN, FERRITIN in the last 72 hours.  Studies/Results: No results found.  I have reviewed the patient's current medications.  Assessment/Plan: 1 AKI still oliguric,rhabdo.  Acid/base/K vol ok. With less sedation, will try conversion to IHD, if can stabilize press 2 Drug OD 3 Behavior still issue but seems less 4 ? pneu 5^ WBC P stop CRRT with filter time out , IHD, AB,     LOS: 7 days   Blake Kramer L 07/16/2016,7:22 AM

## 2016-07-16 NOTE — Progress Notes (Signed)
Repeat 2D echo shows resolution of LV dysfunction.  EF now grossly normal.  Initial LV dysfunction likely secondary to acidosis and myocardial depression.  Trop bump secondary to acute renal failure and rhabdo.  No further cardiac workup planned.  Will sign off.  Call with any questions.

## 2016-07-16 NOTE — Progress Notes (Signed)
eLink Physician-Brief Progress Note Patient Name: Blake Kramer DOB: 1995/03/27 MRN: [id number]   Date of Service  07/16/2016  HPI/Events of Note  Patient is gagging on Protonix PO.   eICU Interventions  Will order: 1. Protonix IV.      Intervention Category Intermediate Interventions: Other:  Lenell AntuSommer,Calleigh Lafontant Eugene 07/16/2016, 9:30 PM

## 2016-07-16 NOTE — Progress Notes (Signed)
Nutrition Follow-up  DOCUMENTATION CODES:   Obesity unspecified  INTERVENTION:    Boost Breeze po BID, each supplement provides 250 kcal and 9 grams of protein  NUTRITION DIAGNOSIS:   Increased nutrient needs related to acute illness as evidenced by estimated needs.  Ongoing  GOAL:   Patient will meet greater than or equal to 90% of their needs  Progressing  MONITOR:   PO intake, Supplement acceptance, Labs, I & O's  ASSESSMENT:   Pt with no know PMH admitted after presumed heroine and fentanyl OD possibly down for 10 hours. Intubated with rhabdo, hyperkalemia, AKI.   Self-extubated 6/11. Diet advanced to renal this morning. RN reports good intake of clear liquid meals before today. Expect intake of solids will be good. Patient will need supplements to meet increased nutrition needs. Labs and medications reviewed.  Diet Order:  Diet renal with fluid restriction Fluid restriction: 1200 mL Fluid; Room service appropriate? Yes; Fluid consistency: Thin  Skin:  Reviewed, no issues  Last BM:  PTA  Height:   Ht Readings from Last 1 Encounters:  07/09/16 6' (1.829 m)    Weight:   Wt Readings from Last 1 Encounters:  07/16/16 233 lb 11 oz (106 kg)    Ideal Body Weight:  80.9 kg  BMI:  Body mass index is 31.69 kg/m.  Estimated Nutritional Needs:   Kcal:  2200-2400  Protein:  100-115 gm  Fluid:  per MD  EDUCATION NEEDS:   No education needs identified at this time  Joaquin CourtsKimberly Riyanshi Wahab, RD, LDN, CNSC Pager [phone] After Hours Pager [phone]

## 2016-07-16 NOTE — Progress Notes (Signed)
PULMONARY / CRITICAL CARE MEDICINE   Name: Blake Kramer MRN: [id number] DOB: Jul 14, 1995    ADMISSION DATE:  07/09/2016 CONSULTATION DATE:  07/09/16  REFERRING MD:  [facility]  CHIEF COMPLAINT:  Overdose  BRIEF SUMMARY:  Blake Kramer is a 21 y.o. man  brought to [facility] ICU 6/7 from [place] after presumed drug overdose.  He was given 3 rounds of narcan with little response.  In ED, he was intubated for airway protection. UDS positive for opiates.  CT head negative. He developed AKI requiring CRRT  SUBJECTIVE:   Remains critically ill, on CRRT Pressors being tapered Precedex being tapered  VITAL SIGNS: BP (!) 120/58   Pulse (!) 128   Temp 98.1 F (36.7 C) (Oral)   Resp 15   Ht 6' (1.829 m)   Wt 233 lb 11 oz (106 kg)   SpO2 96%   BMI 31.69 kg/m   HEMODYNAMICS:    VENTILATOR SETTINGS:    INTAKE / OUTPUT: I/O last 3 completed shifts: In: 1987.2 [P.O.:750; I.V.:1187.2; IV Piggyback:50] Out: 2118 [Other:2118]   PHYSICAL EXAMINATION: General: Ill-appearing young man, interactive HEENT: Oropharynx clear, no pallor, citerus Neuro: more awake, non focal, less agitation, good strength in all extremities CV: Regular, no murmur PULM: Clearing bilaterally, no wheezing, strong cough GI: Soft, benign, positive bowel sounds GU: Three-way Foley catheter in place Extremities: No significant lower extremity edema Skin: No rash   LABS:  BMET  Recent Labs Lab 07/15/16 0425 07/15/16 1516 07/16/16 0359  NA 136 136 138  K 4.8 4.4 4.4  CL 101 103 102  CO2 25 26 27   BUN 31* 28* 27*  CREATININE 2.84* 2.63* 2.60*  GLUCOSE 93 96 107*    Electrolytes  Recent Labs Lab 07/14/16 0405  07/15/16 0425 07/15/16 1516 07/16/16 0357 07/16/16 0359  CALCIUM 8.4*  < > 8.6* 8.6*  --  8.6*  MG 2.9*  --  2.8*  --  2.7*  --   PHOS 4.0  4.0  < > 3.6 3.2  3.2  --  2.8  < > = values in this interval not displayed.  CBC  Recent Labs Lab 07/14/16 0405 07/15/16 0425  07/16/16 0357  WBC 19.4* 17.0* 14.0*  HGB 11.8* 11.2* 10.7*  HCT 36.0* 34.8* 32.7*  PLT 156 159 165    Coag's  Recent Labs Lab 07/13/16 0420 07/14/16 0405 07/15/16 0425 07/16/16 0357  APTT 117* 74* 114* 120*  INR 1.07  --   --   --     Sepsis Markers  Recent Labs Lab 07/09/16 1955  LATICACIDVEN 1.8    ABG  Recent Labs Lab 07/10/16 0430  PHART 7.353  PCO2ART 39.8  PO2ART 70.8*    Liver Enzymes  Recent Labs Lab 07/11/16 0645  07/12/16 0410  07/15/16 0425 07/15/16 1516 07/16/16 0359  AST 1,359*  --  664*  --  246*  --   --   ALT 2,794*  --  1,785*  --  536*  --   --   ALKPHOS 102  --  104  --  79  --   --   BILITOT 0.8  --  0.8  --  0.8  --   --   ALBUMIN 2.7*  2.8*  < > 2.6*  < > 2.7* 2.6* 2.6*  < > = values in this interval not displayed.  Cardiac Enzymes  Recent Labs Lab 07/09/16 1955 07/10/16 0845  TROPONINI 24.10* 9.78*    Glucose  Recent Labs Lab 07/15/16  1157 07/15/16 2014 07/15/16 2328 07/16/16 0815 07/16/16 0817 07/16/16 1206  GLUCAP 68 72 82 76 91 127*    Imaging No results found.   STUDIES:  CT head 6/7 > negative. Echo 6/7 >> LV moderately dilated, mild LVH, LVEF 25-30%, diffuse hypokinesis Renal US 6/7 >> no obstructive uropathy, increased renal cortical echogenicity as seen with AKI Hepatitis Panel 6/7 >> negative  HIV 6/7 >> non-reactive  CULTURES: Blood 6/7 >> ng  ANTIBIOTICS: Rocephin 6/8 >>   SIGNIFICANT EVENTS: 6/07  Admit with presumed overdose, CVVHD started  6/08  Remains obtunded 6/11 self extubated 6/11 urethral injury after traumatic removal of Foley catheter  LINES/TUBES: ETT 6/7 >> 6/11 R IJ CVL 6/7 >>  L IJ HD 6/7 >>   DISCUSSION: 21 y.o. male transferred from [place] after presumed heroin and fentanyl overdose.  Down for possibly 10 hours. Was intubated with rhabdo, hyperkalemia, AKI, troponin bump. Self extubated 6/11. Urethral injury after traumatic Foley removal by the patient  6/11  ASSESSMENT / PLAN:  PULMONARY A: Acute resp failure - s/p intubation due to inability to protect the airway in the setting of drug overdose. Probable aspiration pneumonia versus pneumonitis P:   Continue pulmonary hygiene    CARDIOVASCULAR A:  Demand ischemia  Dilated Cardiomyopathy -resolved, LV dysfunction due to acidosis P:  telemetry  RENAL A:   Hyperkalemia -resolved AKI  Rhabdomyolysis-  resolved AGMA - lactate, resolved Urethral trauma from self-foley removal, significant bleeding.  P:   Once off pressors, can transition to intermittent HD in my opinion Hopeful for renal recovery here Follow BMP, urine output Replace electrolytes as indicated  Three-way irrigation catheter  placed by urology- Suspect that he will require this for extended period given the urethral disruption. Absolutely do not remove Foley catheter without speaking with urology first.  GASTROINTESTINAL A:   Shock Liver / Transaminitis - resolving GI prophylaxis. Nutrition. P:   advance diet   HEMATOLOGIC A:   Acute blood loss due to urethral trauma Thrombocytopenia  VTE Prophylaxis. P:  Follow CBC Subcutaneous heparin   INFECTIOUS A:   [facility], [facility] GPC's, thick yellow oral secretions P:   Dc ceftriaxone  Follow culture data including from outside hospital  ENDOCRINE A:   Hypoglycemia - in setting of liver dysfunction, resolved P:   Follow CBG  NEUROLOGIC A:   Acute encephalopathy - due to presumed drug overdose + sedation. Presumed heroin and fentanyl overdose. P:   RASS Goal: 0 Continue to work on Arts administratorweaning Precedex as able, frequent orientation Ativan as needed Morphine as needed DC Haldol   Family updated: Mother updated at bedside 6/8.  Interdisciplinary Family Meeting v Palliative Care Meeting:  NA   Independent critical care time is 32 minutes.    Cyril Mourningakesh Alva MD. Tonny BollmanFCCP. [place] [facility] Pager [phone] If no response call [phone]     07/16/2016, 1:54 PM

## 2016-07-17 LAB — CBC
HCT: 32.5 % — ABNORMAL LOW (ref 39.0–52.0)
HEMOGLOBIN: 10.5 g/dL — AB (ref 13.0–17.0)
MCH: 29.3 pg (ref 26.0–34.0)
MCHC: 32.3 g/dL (ref 30.0–36.0)
MCV: 90.8 fL (ref 78.0–100.0)
Platelets: 203 10*3/uL (ref 150–400)
RBC: 3.58 MIL/uL — ABNORMAL LOW (ref 4.22–5.81)
RDW: 15.6 % — AB (ref 11.5–15.5)
WBC: 15.6 10*3/uL — ABNORMAL HIGH (ref 4.0–10.5)

## 2016-07-17 LAB — COMPREHENSIVE METABOLIC PANEL
ALBUMIN: 2.7 g/dL — AB (ref 3.5–5.0)
ALK PHOS: 94 U/L (ref 38–126)
ALT: 223 U/L — ABNORMAL HIGH (ref 17–63)
ANION GAP: 10 (ref 5–15)
AST: 104 U/L — ABNORMAL HIGH (ref 15–41)
BUN: 42 mg/dL — ABNORMAL HIGH (ref 6–20)
CALCIUM: 8.7 mg/dL — AB (ref 8.9–10.3)
CO2: 23 mmol/L (ref 22–32)
Chloride: 98 mmol/L — ABNORMAL LOW (ref 101–111)
Creatinine, Ser: 4.84 mg/dL — ABNORMAL HIGH (ref 0.61–1.24)
GFR calc non Af Amer: 16 mL/min — ABNORMAL LOW (ref >60)
GFR, EST AFRICAN AMERICAN: 18 mL/min — AB (ref >60)
GLUCOSE: 115 mg/dL — AB (ref 65–99)
POTASSIUM: 4.7 mmol/L (ref 3.5–5.1)
SODIUM: 131 mmol/L — AB (ref 135–145)
Total Bilirubin: 0.7 mg/dL (ref 0.3–1.2)
Total Protein: 6.1 g/dL — ABNORMAL LOW (ref 6.5–8.1)

## 2016-07-17 LAB — GLUCOSE, CAPILLARY
GLUCOSE-CAPILLARY: 108 mg/dL — AB (ref 65–99)
GLUCOSE-CAPILLARY: 130 mg/dL — AB (ref 65–99)
Glucose-Capillary: 102 mg/dL — ABNORMAL HIGH (ref 65–99)
Glucose-Capillary: 134 mg/dL — ABNORMAL HIGH (ref 65–99)
Glucose-Capillary: 93 mg/dL (ref 65–99)

## 2016-07-17 LAB — PHOSPHORUS: PHOSPHORUS: 4.8 mg/dL — AB (ref 2.5–4.6)

## 2016-07-17 MED ORDER — CALCIUM ACETATE (PHOS BINDER) 667 MG PO CAPS
667.0000 mg | ORAL_CAPSULE | Freq: Three times a day (TID) | ORAL | Status: DC
  Administered 2016-07-17 – 2016-07-20 (×11): 667 mg via ORAL
  Filled 2016-07-17 (×13): qty 1

## 2016-07-17 MED ORDER — DOCUSATE SODIUM 100 MG PO CAPS
100.0000 mg | ORAL_CAPSULE | Freq: Two times a day (BID) | ORAL | Status: DC
  Administered 2016-07-17 – 2016-08-06 (×38): 100 mg via ORAL
  Filled 2016-07-17 (×41): qty 1

## 2016-07-17 MED ORDER — METHADONE HCL 5 MG PO TABS
10.0000 mg | ORAL_TABLET | Freq: Every day | ORAL | Status: DC
  Administered 2016-07-17 – 2016-08-03 (×18): 10 mg via ORAL
  Filled 2016-07-17: qty 2
  Filled 2016-07-17: qty 1
  Filled 2016-07-17: qty 2
  Filled 2016-07-17: qty 1
  Filled 2016-07-17: qty 2
  Filled 2016-07-17: qty 1
  Filled 2016-07-17 (×12): qty 2

## 2016-07-17 NOTE — Progress Notes (Signed)
Subjective: Interval History: has complaints wants to get, up.  Awake but confused.  Objective: Vital signs in last 24 hours: Temp:  [97.3 F (36.3 C)-99.4 F (37.4 C)] 99.4 F (37.4 C) (06/15 0317) Pulse Rate:  [83-137] 122 (06/15 0700) Resp:  [13-23] 21 (06/15 0700) BP: (85-156)/(45-112) 129/75 (06/15 0700) SpO2:  [85 %-98 %] 93 % (06/15 0700) Weight:  [106.1 kg (233 lb 14.5 oz)] 106.1 kg (233 lb 14.5 oz) (06/15 0500) Weight change: 0.1 kg (3.5 oz)  Intake/Output from previous day: 06/14 0701 - 06/15 0700 In: 2893.1 [P.O.:2443; I.V.:340.1; IV Piggyback:50] Out: 1719 [Urine:95] Intake/Output this shift: No intake/output data recorded.  General appearancconfusedcooperative, moderately obese, pale and confused  Neck IJ cath CV reg rate 120s. Resp scattered rhonchi Abdm  Obese, pos bs, liver down 6 cm Extem tr edema  Lab Results:  Recent Labs  07/16/16 0357 07/17/16 0400  WBC 14.0* 15.6*  HGB 10.7* 10.5*  HCT 32.7* 32.5*  PLT 165 203   BMET:  Recent Labs  07/16/16 1600 07/17/16 0400  NA 133* 131*  K 4.6 4.7  CL 99* 98*  CO2 27 23  GLUCOSE 118* 115*  BUN 23* 42*  CREATININE 2.68* 4.84*  CALCIUM 8.4* 8.7*   No results for input(s): PTH in the last 72 hours. Iron Studies: No results for input(s): IRON, TIBC, TRANSFERRIN, FERRITIN in the last 72 hours.  Studies/Results: No results found.  I have reviewed the patient's current medications.  Assessment/Plan: 1 AKI rhabdo.  A little urine.  No function. Vol fair 2 CM resolved 3 Drug OD 4 Substance abuse 5 Enceph more active, confused 6 urethral injury P HD in am, follow WBC, monitor closely   LOS: 8 days   Blake Kramer 07/17/2016,7:38 AM

## 2016-07-17 NOTE — Progress Notes (Signed)
PULMONARY / CRITICAL CARE MEDICINE   Name: Blake Kramer MRN: [id number] DOB: [birth year]    ADMISSION DATE:  07/09/2016 CONSULTATION DATE:  07/09/16  REFERRING MD:  [facility]  CHIEF COMPLAINT:  Overdose  BRIEF SUMMARY:  Blake Kramer is a 21 y.o. man  brought to [facility] ICU 6/7 from [place] after presumed drug overdose.  He was given 3 rounds of narcan with little response.  In ED, he was intubated for airway protection. UDS positive for opiates.  CT head negative. He developed AKI requiring CRRT   STUDIES:  CT head 6/7 > negative. Echo 6/7 >> LV moderately dilated, mild LVH, LVEF 25-30%, diffuse hypokinesis Renal US 6/7 >> no obstructive uropathy, increased renal cortical echogenicity as seen with AKI Hepatitis Panel 6/7 >> negative  HIV 6/7 >> non-reactive  CULTURES: Blood 6/7 >> ng  ANTIBIOTICS: Rocephin 6/8 >>   SIGNIFICANT EVENTS: 6/07  Admit with presumed overdose, CVVHD started  6/08  Remains obtunded 6/11 self extubated 6/11 urethral injury after traumatic removal of Foley catheter  LINES/TUBES: ETT 6/7 >> 6/11 R IJ CVL 6/7 >>  L IJ HD 6/7 >>     SUBJECTIVE:   Off CRRT Pressors off Precedex off  VITAL SIGNS: BP 125/72   Pulse (!) 128   Temp 98.6 F (37 C) (Oral)   Resp (!) 25   Ht 6' (1.829 m)   Wt 233 lb 14.5 oz (106.1 kg)   SpO2 95%   BMI 31.72 kg/m   HEMODYNAMICS:    VENTILATOR SETTINGS:    INTAKE / OUTPUT: I/O last 3 completed shifts: In: 3577.5 [P.O.:2783; I.V.:684.5; Other:60; IV Piggyback:50] Out: 2399 [Urine:95; Other:2304]   PHYSICAL EXAMINATION: General: acutely Ill-appearing young man, interactive HEENT: Oropharynx clear, no pallor, citerus Neuro: more awake, non focal, less agitation, good strength in all extremities CV: Regular, no murmur PULM: Clear bilaterally, no wheezing, strong cough GI: Soft, benign, positive bowel sounds GU: Three-way Foley catheter in place Extremities: No significant lower extremity  edema Skin: No rash   LABS:  BMET  Recent Labs Lab 07/16/16 0359 07/16/16 1600 07/17/16 0400  NA 138 133* 131*  K 4.4 4.6 4.7  CL 102 99* 98*  CO2 27 27 23   BUN 27* 23* 42*  CREATININE 2.60* 2.68* 4.84*  GLUCOSE 107* 118* 115*    Electrolytes  Recent Labs Lab 07/14/16 0405  07/15/16 0425  07/16/16 0357 07/16/16 0359 07/16/16 1600 07/17/16 0400  CALCIUM 8.4*  < > 8.6*  < >  --  8.6* 8.4* 8.7*  MG 2.9*  --  2.8*  --  2.7*  --   --   --   PHOS 4.0  4.0  < > 3.6  < >  --  2.8 2.6 4.8*  < > = values in this interval not displayed.  CBC  Recent Labs Lab 07/15/16 0425 07/16/16 0357 07/17/16 0400  WBC 17.0* 14.0* 15.6*  HGB 11.2* 10.7* 10.5*  HCT 34.8* 32.7* 32.5*  PLT 159 165 203    Coag's  Recent Labs Lab 07/13/16 0420 07/14/16 0405 07/15/16 0425 07/16/16 0357  APTT 117* 74* 114* 120*  INR 1.07  --   --   --     Sepsis Markers No results for input(s): LATICACIDVEN, PROCALCITON, O2SATVEN in the last 168 hours.  ABG No results for input(s): PHART, PCO2ART, PO2ART in the last 168 hours.  Liver Enzymes  Recent Labs Lab 07/12/16 0410  07/15/16 0425  07/16/16 0359 07/16/16 1600 07/17/16 0400  AST  664*  --  246*  --   --   --  104*  ALT 1,785*  --  536*  --   --   --  223*  ALKPHOS 104  --  79  --   --   --  94  BILITOT 0.8  --  0.8  --   --   --  0.7  ALBUMIN 2.6*  < > 2.7*  < > 2.6* 2.8* 2.7*  < > = values in this interval not displayed.  Cardiac Enzymes No results for input(s): TROPONINI, PROBNP in the last 168 hours.  Glucose  Recent Labs Lab 07/16/16 0817 07/16/16 1206 07/16/16 1549 07/16/16 2019 07/16/16 2338 07/17/16 0751  GLUCAP 91 127* 107* 135* 111* 108*    Imaging No results found.     DISCUSSION: 21 y.o. male [place] from [place] after presumed heroin and fentanyl overdose.  Down for possibly 10 hours. Was intubated with rhabdo, hyperkalemia, AKI, troponin bump. Self extubated 6/11. Urethral injury  after traumatic Foley removal by the patient 6/11  ASSESSMENT / PLAN:  PULMONARY A: Acute resp failure - s/p intubation due to inability to protect the airway in the setting of drug overdose. Probable aspiration pneumonitis P:   Continue pulmonary hygiene    CARDIOVASCULAR A:  Demand ischemia  Dilated Cardiomyopathy -resolved, LV dysfunction due to acidosis P:  telemetry  RENAL A:   Hyperkalemia -resolved AKI  Rhabdomyolysis-  resolved AGMA - lactate, resolved Urethral trauma from self-foley removal, significant bleeding.  P:   transition to intermittent HD -Hopeful for renal recovery here Follow BMP, urine output Replace electrolytes as indicated  Three-way irrigation catheter  placed by urology- plan for at least 4 wks per urology Absolutely do NOT remove Foley catheter , if problems with irrigation, call urology  GASTROINTESTINAL A:   Shock Liver / Transaminitis - resolving GI prophylaxis. Nutrition. P:   advance diet   HEMATOLOGIC A:   Acute blood loss due to urethral trauma Thrombocytopenia  VTE Prophylaxis. P:  Follow CBC Subcutaneous heparin   INFECTIOUS A:   [facility], thick yellow oral secretions P:   Dc ceftriaxone  Follow culture data including from outside hospital  ENDOCRINE A:   Hypoglycemia - in setting of liver dysfunction, resolved P:   Follow CBG  NEUROLOGIC A:   Acute encephalopathy - due to presumed drug overdose + sedation. Presumed heroin and fentanyl overdose. P:   Off Precedex , frequent orientation Ativan as needed Morphine as needed, start 10 mg methadone & wean off morphine PT consult   Family updated: Mother   Interdisciplinary Family Meeting v Palliative Care Meeting:  NA  Transfer to SDU & to triad 6/16  Independent critical care time is 32 minutes.    Blake Mourningakesh Nyriah Coote MD. Tonny BollmanFCCP. [facility] Pager [phone] If no response  call [phone]     07/17/2016, 10:19 AM

## 2016-07-18 LAB — GLUCOSE, CAPILLARY
GLUCOSE-CAPILLARY: 156 mg/dL — AB (ref 65–99)
GLUCOSE-CAPILLARY: 115 mg/dL — AB (ref 65–99)
Glucose-Capillary: 105 mg/dL — ABNORMAL HIGH (ref 65–99)
Glucose-Capillary: 111 mg/dL — ABNORMAL HIGH (ref 65–99)
Glucose-Capillary: 124 mg/dL — ABNORMAL HIGH (ref 65–99)
Glucose-Capillary: 98 mg/dL (ref 65–99)

## 2016-07-18 LAB — CBC
HCT: 31.6 % — ABNORMAL LOW (ref 39.0–52.0)
Hemoglobin: 10.2 g/dL — ABNORMAL LOW (ref 13.0–17.0)
MCH: 29.1 pg (ref 26.0–34.0)
MCHC: 32.3 g/dL (ref 30.0–36.0)
MCV: 90.3 fL (ref 78.0–100.0)
Platelets: 205 10*3/uL (ref 150–400)
RBC: 3.5 MIL/uL — ABNORMAL LOW (ref 4.22–5.81)
RDW: 15.4 % (ref 11.5–15.5)
WBC: 15.8 10*3/uL — ABNORMAL HIGH (ref 4.0–10.5)

## 2016-07-18 LAB — RENAL FUNCTION PANEL
ALBUMIN: 2.6 g/dL — AB (ref 3.5–5.0)
Anion gap: 10 (ref 5–15)
BUN: 75 mg/dL — ABNORMAL HIGH (ref 6–20)
CALCIUM: 8.8 mg/dL — AB (ref 8.9–10.3)
CO2: 25 mmol/L (ref 22–32)
Chloride: 97 mmol/L — ABNORMAL LOW (ref 101–111)
Creatinine, Ser: 7.96 mg/dL — ABNORMAL HIGH (ref 0.61–1.24)
GFR calc Af Amer: 10 mL/min — ABNORMAL LOW (ref >60)
GFR calc non Af Amer: 9 mL/min — ABNORMAL LOW (ref >60)
GLUCOSE: 101 mg/dL — AB (ref 65–99)
PHOSPHORUS: 7.8 mg/dL — AB (ref 2.5–4.6)
Potassium: 5.6 mmol/L — ABNORMAL HIGH (ref 3.5–5.1)
Sodium: 132 mmol/L — ABNORMAL LOW (ref 135–145)

## 2016-07-18 MED ORDER — LIDOCAINE-PRILOCAINE 2.5-2.5 % EX CREA
1.0000 "application " | TOPICAL_CREAM | CUTANEOUS | Status: DC | PRN
  Filled 2016-07-18: qty 5

## 2016-07-18 MED ORDER — SODIUM CHLORIDE 0.9 % IV SOLN: 100.0000 mL | INTRAVENOUS | Status: DC | PRN

## 2016-07-18 MED ORDER — PENTAFLUOROPROP-TETRAFLUOROETH EX AERO: 1.0000 "application " | INHALATION_SPRAY | CUTANEOUS | Status: DC | PRN

## 2016-07-18 MED ORDER — HEPARIN SODIUM (PORCINE) 1000 UNIT/ML DIALYSIS: 1000.0000 [IU] | INTRAMUSCULAR | Status: DC | PRN

## 2016-07-18 MED ORDER — HEPARIN SODIUM (PORCINE) 1000 UNIT/ML DIALYSIS
100.0000 [IU]/kg | INTRAMUSCULAR | Status: DC | PRN
  Administered 2016-07-18: 10600 [IU] via INTRAVENOUS_CENTRAL
  Filled 2016-07-18 (×2): qty 11

## 2016-07-18 MED ORDER — LIDOCAINE HCL (PF) 1 % IJ SOLN: 5.0000 mL | INTRAMUSCULAR | Status: DC | PRN

## 2016-07-18 MED ORDER — ALTEPLASE 2 MG IJ SOLR
2.0000 mg | Freq: Once | INTRAMUSCULAR | Status: DC | PRN
  Filled 2016-07-18: qty 2

## 2016-07-18 NOTE — Progress Notes (Signed)
Subjective: Interval History: has complaints prob deep breathing . Asking about opiate withdrawal programs.  Objective: Vital signs in last 24 hours: Temp:  [98.4 F (36.9 C)-99.4 F (37.4 C)] 98.5 F (36.9 C) (06/16 0400) Pulse Rate:  [98-128] 110 (06/16 0700) Resp:  [14-25] 19 (06/16 0700) BP: (111-155)/(56-93) 124/59 (06/16 0700) SpO2:  [90 %-100 %] 93 % (06/16 0700) Weight:  [107.6 kg (237 lb 3.4 oz)] 107.6 kg (237 lb 3.4 oz) (06/16 0500) Weight change: 1.5 kg (3 lb 4.9 oz)  Intake/Output from previous day: 06/15 0701 - 06/16 0700 In: 1222 [P.O.:982; I.V.:120] Out: 168 [Urine:168] Intake/Output this shift: No intake/output data recorded.  General appearance: cooperative, moderately obese and confused Neck: L IJ cath Resp: rhonchi bibasilar Cardio: S1, S2 normal GI: obese, pos bs, liver down 5 cm Extremities: extremities normal, atraumatic, no cyanosis or edema  Lab Results:  Recent Labs  07/17/16 0400 07/18/16 0415  WBC 15.6* 15.8*  HGB 10.5* 10.2*  HCT 32.5* 31.6*  PLT 203 205   BMET:  Recent Labs  07/17/16 0400 07/18/16 0415  NA 131* 132*  K 4.7 5.6*  CL 98* 97*  CO2 23 25  GLUCOSE 115* 101*  BUN 42* 75*  CREATININE 4.84* 7.96*  CALCIUM 8.7* 8.8*   No results for input(s): PTH in the last 72 hours. Iron Studies: No results for input(s): IRON, TIBC, TRANSFERRIN, FERRITIN in the last 72 hours.  Studies/Results: No results found.  I have reviewed the patient's current medications.  Assessment/Plan: 1 AKI rhabdo.  [place]^k, mild ^ vol. For IHD today 2 Drug OD 3 Substance abuse 4 Confused P IHD, mobilize,     LOS: 9 days   Blake Kramer L 07/18/2016,7:38 AM

## 2016-07-18 NOTE — Procedures (Signed)
I was present at this session.  I have reviewed the session itself and made appropriate changes. HD via temp cath. Flow 400.  Thus far behavior ok. HR stable in 110-120s Blake Kramer L 6/16/201810:24 AM

## 2016-07-18 NOTE — Progress Notes (Signed)
Arrived to patient room 31M-10.  Reviewed treatment plan and this RN agrees with plan.  Report received from bedside RN, Fuller CanadaJohny.  Consent obtained.  Patient A & o X 3, d/o to time.   Lung sounds diminished to ausculation in all fields. BLE +1 edema. Cardiac:  ST.  Removed caps and cleansed LIJ catheter with chlorhedxidine.  Aspirated ports of heparin and flushed them with saline per protocol.  Connected and secured lines, initiated treatment at 0907.  UF Goal of 3500 mL and net fluid removal 3 L.  Will continue to monitor.

## 2016-07-18 NOTE — Progress Notes (Addendum)
PULMONARY / CRITICAL CARE MEDICINE   Name: Blake Kramer MRN: [id number] DOB: 1995-04-24    ADMISSION DATE:  07/09/2016 CONSULTATION DATE:  07/09/16  REFERRING MD:  [facility]  CHIEF COMPLAINT:  Overdose  BRIEF SUMMARY:  Blake Kramer is a 21 y.o. man  brought to [facility] ICU 6/7 from [place] after presumed drug overdose.  He was given 3 rounds of narcan with little response.  In ED, he was intubated for airway protection. UDS positive for opiates. He developed AKI requiring CRRT   STUDIES:  CT head 6/7 > negative. Echo 6/7 >> LV moderately dilated, mild LVH, LVEF 25-30%, diffuse hypokinesis Renal US 6/7 >> no obstructive uropathy, increased renal cortical echogenicity as seen with AKI Hepatitis Panel 6/7 >> negative  HIV 6/7 >> non-reactive  CULTURES: Blood 6/7 >> ng  ANTIBIOTICS: Rocephin 6/8 >>   SIGNIFICANT EVENTS: 6/07  Admit with presumed overdose, CVVHD started  6/08  Remains obtunded 6/11 self extubated 6/11 urethral injury after traumatic removal of Foley catheter  LINES/TUBES: ETT 6/7 >> 6/11 R IJ CVL 6/7 >>  L IJ HD 6/7 >>     SUBJECTIVE:   Hearing is poor Some confusion persists, although much improved overall   VITAL SIGNS: BP (!) 124/59   Pulse (!) 110   Temp 98.7 F (37.1 C) (Oral)   Resp 19   Ht 6' (1.829 m)   Wt 237 lb 3.4 oz (107.6 kg)   SpO2 93%   BMI 32.17 kg/m   HEMODYNAMICS:    VENTILATOR SETTINGS:    INTAKE / OUTPUT: I/O last 3 completed shifts: In: 1924 [P.O.:1484; I.V.:260; Other:180] Out: 263 [Urine:263]   PHYSICAL EXAMINATION: General: acutely Ill-appearing young man, interactive HEENT: Oropharynx clear, no pallor, citerus Neuro: more awake, non focal, less agitation, good strength in all extremities CV: Regular, no murmur PULM: Clear bilaterally, no wheezing, strong cough GI: Soft, benign, positive bowel sounds GU: Three-way Foley catheter in place Extremities: No significant lower extremity edema Skin: No  rash   LABS:  BMET  Recent Labs Lab 07/16/16 1600 07/17/16 0400 07/18/16 0415  NA 133* 131* 132*  K 4.6 4.7 5.6*  CL 99* 98* 97*  CO2 27 23 25   BUN 23* 42* 75*  CREATININE 2.68* 4.84* 7.96*  GLUCOSE 118* 115* 101*    Electrolytes  Recent Labs Lab 07/14/16 0405  07/15/16 0425  07/16/16 0357  07/16/16 1600 07/17/16 0400 07/18/16 0415  CALCIUM 8.4*  < > 8.6*  < >  --   < > 8.4* 8.7* 8.8*  MG 2.9*  --  2.8*  --  2.7*  --   --   --   --   PHOS 4.0  4.0  < > 3.6  < >  --   < > 2.6 4.8* 7.8*  < > = values in this interval not displayed.  CBC  Recent Labs Lab 07/16/16 0357 07/17/16 0400 07/18/16 0415  WBC 14.0* 15.6* 15.8*  HGB 10.7* 10.5* 10.2*  HCT 32.7* 32.5* 31.6*  PLT 165 203 205    Coag's  Recent Labs Lab 07/13/16 0420 07/14/16 0405 07/15/16 0425 07/16/16 0357  APTT 117* 74* 114* 120*  INR 1.07  --   --   --     Sepsis Markers No results for input(s): LATICACIDVEN, PROCALCITON, O2SATVEN in the last 168 hours.  ABG No results for input(s): PHART, PCO2ART, PO2ART in the last 168 hours.  Liver Enzymes  Recent Labs Lab 07/12/16 0410  07/15/16 0425  07/16/16 1600 07/17/16 0400 07/18/16 0415  AST 664*  --  246*  --   --  104*  --   ALT 1,785*  --  536*  --   --  223*  --   ALKPHOS 104  --  79  --   --  94  --   BILITOT 0.8  --  0.8  --   --  0.7  --   ALBUMIN 2.6*  < > 2.7*  < > 2.8* 2.7* 2.6*  < > = values in this interval not displayed.  Cardiac Enzymes No results for input(s): TROPONINI, PROBNP in the last 168 hours.  Glucose  Recent Labs Lab 07/17/16 1211 07/17/16 1624 07/17/16 2004 07/17/16 2356 07/18/16 0409 07/18/16 0831  GLUCAP 102* 134* 130* 93 98 105*    Imaging No results found.     DISCUSSION: 21 [place].o. [place] presumed heroin and fentanyl overdose. Was intubated with rhabdo, hyperkalemia, AKI, troponin bump. Self extubated 6/11. Urethral injury after traumatic Foley removal  by the patient 6/11  ASSESSMENT / PLAN:  PULMONARY A: Acute resp failure - s/p intubation due to inability to protect the airway in the setting of drug overdose, resolved Probable aspiration pneumonitis P:   Continue pulmonary hygiene    CARDIOVASCULAR A:  Demand ischemia  Dilated Cardiomyopathy -resolved, LV dysfunction due to acidosis P:  telemetry  RENAL A:   Hyperkalemia -resolved AKI  Rhabdomyolysis-  resolved AGMA - lactate, resolved Urethral trauma from self-foley removal, significant bleeding.  P:   Transitioned  to intermittent HD -Hopeful for renal recovery here Follow BMP, urine output Replace electrolytes as indicated  Three-way irrigation catheter  placed by urology- plan for at least 4 wks per urology Absolutely do NOT remove Foley catheter , if problems with irrigation, call urology  GASTROINTESTINAL A:   Shock Liver / Transaminitis - resolving P:   Follow LFts intermittent   INFECTIOUS A:   [facility], [facility]4 [facility]  P:   Dc ceftriaxone   NEUROLOGIC A:   Acute encephalopathy - due to presumed drug overdose + sedation. Presumed heroin and fentanyl overdose. P:   Off Precedex , frequent orientation Ativan as needed Morphine as needed, started 10 mg methadone & wean off morphine PT following, oob daily   Family updated: Mother   Interdisciplinary Family Meeting v Palliative Care Meeting:  NA  Transfer to SDU & to triad 6/16 PCCM available as needed   Cyril Mourningakesh Brandy Kabat MD. [facility]ty Hospital Of NevadaFCCP. [place] [facility] Pager [phone] If no response call [phone]     07/18/2016, 8:47 AM

## 2016-07-18 NOTE — Progress Notes (Addendum)
eLink Physician-Brief Progress Note Patient Name: Blake Kramer DOB: Jun 14, 1995 MRN: [id number]   Date of Service  07/18/2016  HPI/Events of Note  Patient pulled out HD catheter - Request for safety sitter.   eICU Interventions  Will order Recruitment consultantsafety sitter.      Intervention Category Major Interventions: Delirium, psychosis, severe agitation - evaluation and management  Friedrich Harriott Eugene 07/18/2016, 6:40 PM

## 2016-07-18 NOTE — Progress Notes (Signed)
Dialysis treatment completed.  3500 mL ultrafiltrated.  3000 mL net fluid removal.  Patient status unchanged. Lung sounds diminished to ausculation in all fields. BLE +1 edema. Cardiac: ST.  Cleansed LIJ catheter with chlorhexidine.  Disconnected lines and flushed ports with saline per protocol.  Ports locked with heparin and capped per protocol.    Report given to bedside, RN Fuller CanadaJohny.

## 2016-07-18 NOTE — Progress Notes (Signed)
Pt pulled his HD cath out, minimal bleeding. Cleaned with chlora prep and applied pressure dressing. Informed Elink MD, and requested for a safety sitter.  Danne HarborJohny,RN

## 2016-07-19 DIAGNOSIS — S3730XA Unspecified injury of urethra, initial encounter: Secondary | ICD-10-CM

## 2016-07-19 DIAGNOSIS — K7200 Acute and subacute hepatic failure without coma: Secondary | ICD-10-CM

## 2016-07-19 DIAGNOSIS — I248 Other forms of acute ischemic heart disease: Secondary | ICD-10-CM

## 2016-07-19 DIAGNOSIS — T50902A Poisoning by unspecified drugs, medicaments and biological substances, intentional self-harm, initial encounter: Secondary | ICD-10-CM

## 2016-07-19 DIAGNOSIS — I420 Dilated cardiomyopathy: Secondary | ICD-10-CM

## 2016-07-19 LAB — GLUCOSE, CAPILLARY
GLUCOSE-CAPILLARY: 112 mg/dL — AB (ref 65–99)
GLUCOSE-CAPILLARY: 98 mg/dL (ref 65–99)
Glucose-Capillary: 98 mg/dL (ref 65–99)
Glucose-Capillary: 102 mg/dL — ABNORMAL HIGH (ref 65–99)
Glucose-Capillary: 95 mg/dL (ref 65–99)
Glucose-Capillary: 95 mg/dL (ref 65–99)

## 2016-07-19 LAB — RENAL FUNCTION PANEL
ALBUMIN: 2.7 g/dL — AB (ref 3.5–5.0)
ANION GAP: 11 (ref 5–15)
BUN: 43 mg/dL — ABNORMAL HIGH (ref 6–20)
CALCIUM: 8.8 mg/dL — AB (ref 8.9–10.3)
CO2: 26 mmol/L (ref 22–32)
CREATININE: 6.27 mg/dL — AB (ref 0.61–1.24)
Chloride: 94 mmol/L — ABNORMAL LOW (ref 101–111)
GFR, EST AFRICAN AMERICAN: 13 mL/min — AB (ref >60)
GFR, EST NON AFRICAN AMERICAN: 12 mL/min — AB (ref >60)
Glucose, Bld: 111 mg/dL — ABNORMAL HIGH (ref 65–99)
PHOSPHORUS: 7.8 mg/dL — AB (ref 2.5–4.6)
Potassium: 4.8 mmol/L (ref 3.5–5.1)
SODIUM: 131 mmol/L — AB (ref 135–145)

## 2016-07-19 LAB — CBC
HCT: 31.1 % — ABNORMAL LOW (ref 39.0–52.0)
HEMOGLOBIN: 10.2 g/dL — AB (ref 13.0–17.0)
MCH: 29.3 pg (ref 26.0–34.0)
MCHC: 32.8 g/dL (ref 30.0–36.0)
MCV: 89.4 fL (ref 78.0–100.0)
PLATELETS: 276 10*3/uL (ref 150–400)
RBC: 3.48 MIL/uL — AB (ref 4.22–5.81)
RDW: 15.3 % (ref 11.5–15.5)
WBC: 15.1 10*3/uL — AB (ref 4.0–10.5)

## 2016-07-19 MED ORDER — MORPHINE SULFATE (PF) 4 MG/ML IV SOLN
2.0000 mg | INTRAVENOUS | Status: DC | PRN
Start: 2016-07-19 — End: 2016-07-19
  Administered 2016-07-19: 2 mg via INTRAVENOUS
  Filled 2016-07-19: qty 1

## 2016-07-19 NOTE — Progress Notes (Signed)
Given report to 4N and transferred the pt in bed, tolerated well no concerns. Informed his family (sister).  Danne HarborJohny,RN

## 2016-07-19 NOTE — Progress Notes (Signed)
Transferred -in from MICU by bed awake and alert with sitter at bedside.

## 2016-07-19 NOTE — Progress Notes (Signed)
Subjective: Interval History: pulled HD cath. No insight into consequences  Objective: Vital signs in last 24 hours: Temp:  [98 F (36.7 C)-98.9 F (37.2 C)] 98.9 F (37.2 C) (06/17 0424) Pulse Rate:  [92-142] 92 (06/17 0500) Resp:  [13-25] 14 (06/17 0500) BP: (93-147)/(55-100) 113/60 (06/17 0500) SpO2:  [90 %-99 %] 92 % (06/17 0500) Weight:  [103.4 kg (227 lb 15.3 oz)-107.6 kg (237 lb 3.4 oz)] 103.4 kg (227 lb 15.3 oz) (06/17 0330) Weight change: 0 kg (0 lb)  Intake/Output from previous day: 06/16 0701 - 06/17 0700 In: 860 [P.O.:800] Out: 3110 [Urine:110] Intake/Output this shift: No intake/output data recorded.  General appearance: alert, cooperative, moderately obese, pale and no insight Resp: diminished breath sounds bilaterally Cardio: S1, S2 normal and systolic murmur: holosystolic 2/6, blowing at apex GI: obese, pos bs, soft Extremities: extremities normal, atraumatic, no cyanosis or edema  Lab Results:  Recent Labs  07/18/16 0415 07/19/16 0233  WBC 15.8* 15.1*  HGB 10.2* 10.2*  HCT 31.6* 31.1*  PLT 205 276   BMET:  Recent Labs  07/18/16 0415 07/19/16 0233  NA 132* 131*  K 5.6* 4.8  CL 97* 94*  CO2 25 26  GLUCOSE 101* 111*  BUN 75* 43*  CREATININE 7.96* 6.27*  CALCIUM 8.8* 8.8*   No results for input(s): PTH in the last 72 hours. Iron Studies: No results for input(s): IRON, TIBC, TRANSFERRIN, FERRITIN in the last 72 hours.  Studies/Results: No results found.  I have reviewed the patient's current medications.  Assessment/Plan: 1 AKI  Oliguric rhabdo.  No recovery.  Will need another cath but would put in tomorrow. 2 Drug OD 3 Substance abuse 4 Enceph  Clearly no insight or judgment P new HD cath per CCM, HD in am.   LOS: 10 days   Blake Kramer L 07/19/2016,7:20 AM

## 2016-07-19 NOTE — Progress Notes (Signed)
Reported by Ophelia CharterSitter that he is tagging his foley cath, noted with minimal bleeding, cleansed linen changed. Instructed to avoid touching foley cath.

## 2016-07-19 NOTE — Progress Notes (Signed)
PROGRESS NOTE    Blake Kramer  ZOX:[id number] DOB: 1995-07-14 DOA: 07/09/2016 PCP: No primary care provider on file.   Brief Narrative:  21 y.o.WM  PMHx Drug abuse (10+ episodes of overdose)  He was brought to [facility] ICU 6/7 from [place] after presumed drug overdose. EMS was dispatched to pt's location where they found him lying supine on the floor, pale, diaphoretic. Girlfriend informed EMS that she thought pt had might have been down for roughly 10 hours when she previously saw him. Per police, there had been several cases of fentanyl and heroine overdoses in the community and per their report, scene looked very similar; therefore, they were suspecting the same in this instance. He was given 3 rounds of narcan with little response.  In ED, he was intubated for airway protection. UDS positive for opiates. CT head negative. He developed AKI requiring CRRT  Subjective: 6/17 A/O x 3 ( does not know when). States negative CP, negative SOB, positive Hard of hearing. States has not been out of bed   Assessment & Plan:   Active Problems:   Drug overdose   AKI (acute kidney injury) (HCC)   Acute respiratory failure with hypoxia (HCC)   Urethral bleeding  Intentional drug overdose -Patient admits that he has overdosed on 10+ occasions. Consult Psychiatry on 6/18 if cognition improved.   Acute resp failure with hypoxia -Resolved  Dilated Cardiomyopathy/Demand ischemia -Strict I&O since admission - -Daily weight  Acute renal failure now on HD -Multifactorial to include drug overdose, dilated cardiomyopathy, and rhabdomyolysis -HD per nephrology   Rhabdomyolysis - resolved  Urethral trauma  -Patient self DC'd Foley monitor for signs of infection  -Three-way irrigation catheter placed by urology- plan for at least 4 wks per urology -Absolutely do NOTremove Foley catheter , if problems with irrigation, call urology  Shock Liver / Transaminitis  - resolving GI  prophylaxis.  Acute blood loss due to urethral trauma -Stable monitor   Fever  - 102 at [facility], 1/4 blood cultures at [facility] with GPC's, thick yellow oral secretions -  DC Antibiotics does not appear to be an infection -Follow culture data including from outside hospital  Acute encephalopathy  - due to intentional drug overdose + sedation (Heroin and Fentanyl) -Methadone 10 mg daily -DC morphine  -PT/OT consult   DVT prophylaxis: Subcutaneous heparin Code Status: Full Family Communication:  none Disposition Plan: ?? Pending psychiatry recommendation   Consultants:  [facility] Urology   Procedures/Significant Events:  CT head 6/7 > negative. Echo 6/7 >> LV moderately dilated, mild LVH, LVEF 25-30%, diffuse hypokinesis Renal US 6/7 >> no obstructive uropathy, increased renal cortical echogenicity as seen with AKI   6/07 Admit with presumed overdose, CVVHD started  6/08 Remains obtunded 6/11 self extubated 6/11 urethral injury after traumatic removal of Foley catheter    VENTILATOR SETTINGS: None   Cultures Hepatitis Panel 6/7 >>negative  HIV 6/7 >> non-reactive Blood 6/7 >> NGTD    Antimicrobials: Anti-infectives    Start     Stop   07/10/16 1200  cefTRIAXone (ROCEPHIN) 1 g in dextrose 5 % 50 mL IVPB     07/16/16 1240       Devices None  LINES / TUBES:  ETT 6/7 >> 6/11 R IJ CVL 6/7 >> L IJ HD 6/7 >>    Continuous Infusions: . sodium chloride 10 mL/hr at 07/16/16 2000  . sodium chloride 250 mL (07/14/16 2200)  . sodium chloride    . sodium chloride  Objective: Vitals:   07/19/16 0330 07/19/16 0400 07/19/16 0424 07/19/16 0500  BP:  122/61  113/60  Pulse:  (!) 103  92  Resp:  18  14  Temp:   98.9 F (37.2 C)   TempSrc:   Oral   SpO2:  90%  92%  Weight: 227 lb 15.3 oz (103.4 kg)     Height:        Intake/Output Summary (Last 24 hours) at 07/19/16 0703 Last data filed at 07/19/16 0500  Gross  per 24 hour  Intake              860 ml  Output             3110 ml  Net            -2250 ml   Filed Weights   07/18/16 0858 07/18/16 1322 07/19/16 0330  Weight: 237 lb 3.4 oz (107.6 kg) 230 lb 9.6 oz (104.6 kg) 227 lb 15.3 oz (103.4 kg)    Examination:  General: Sleepy, arousable A/O 3 (does not know when), No acute respiratory distress, hard of hearing Eyes: negative scleral hemorrhage, negative anisocoria, negative icterus ENT: Negative Runny nose, negative gingival bleeding, Neck:  Negative scars, masses, torticollis, lymphadenopathy, JVD Lungs: Clear to auscultation bilaterally without wheezes or crackles Cardiovascular: Regular rate and rhythm without murmur gallop or rub normal S1 and S2 Abdomen: negative abdominal pain, nondistended, positive soft, bowel sounds, no rebound, no ascites, no appreciable mass Extremities: No significant cyanosis, clubbing, or edema bilateral lower extremities Skin: Negative rashes, lesions, ulcers Psychiatric:  Patient somewhat confused difficult to assess  Central nervous system:  Cranial nerves II through XII intact, tongue/uvula midline, all extremities muscle strength 5/5, sensation intact throughout, positive dysarthria (baseline?), negative expressive aphasia, negative receptive aphasia.  .     Data Reviewed: Care during the described time interval was provided by me .  I have reviewed this patient's available data, including medical history, events of note, physical examination, and all test results as part of my evaluation. I have personally reviewed and interpreted all radiology studies.  CBC:  Recent Labs Lab 07/15/16 0425 07/16/16 0357 07/17/16 0400 07/18/16 0415 07/19/16 0233  WBC 17.0* 14.0* 15.6* 15.8* 15.1*  HGB 11.2* 10.7* 10.5* 10.2* 10.2*  HCT 34.8* 32.7* 32.5* 31.6* 31.1*  MCV 90.2 91.1 90.8 90.3 89.4  PLT 159 165 203 205 276   Basic Metabolic Panel:  Recent Labs Lab 07/13/16 0420  07/14/16 0405   07/15/16 0425  07/16/16 0357 07/16/16 0359 07/16/16 1600 07/17/16 0400 07/18/16 0415 07/19/16 0233  NA 138  < > 137  < > 136  < >  --  138 133* 131* 132* 131*  K 4.7  < > 4.6  < > 4.8  < >  --  4.4 4.6 4.7 5.6* 4.8  CL 101  < > 102  < > 101  < >  --  102 99* 98* 97* 94*  CO2 28  < > 24  < > 25  < >  --  27 27 23 25 26   GLUCOSE 101*  < > 109*  < > 93  < >  --  107* 118* 115* 101* 111*  BUN 45*  < > 48*  < > 31*  < >  --  27* 23* 42* 75* 43*  CREATININE 4.10*  < > 4.22*  < > 2.84*  < >  --  2.60* 2.68* 4.84* 7.96* 6.27*  CALCIUM 8.6*  < >  8.4*  < > 8.6*  < >  --  8.6* 8.4* 8.7* 8.8* 8.8*  MG 2.8*  --  2.9*  --  2.8*  --  2.7*  --   --   --   --   --   PHOS 4.4  < > 4.0  4.0  < > 3.6  < >  --  2.8 2.6 4.8* 7.8* 7.8*  < > = values in this interval not displayed. GFR: Estimated Creatinine Clearance: 23.4 mL/min (A) (by C-G formula based on SCr of 6.27 mg/dL (H)). Liver Function Tests:  Recent Labs Lab 07/15/16 0425  07/16/16 0359 07/16/16 1600 07/17/16 0400 07/18/16 0415 07/19/16 0233  AST 246*  --   --   --  104*  --   --   ALT 536*  --   --   --  223*  --   --   ALKPHOS 79  --   --   --  94  --   --   BILITOT 0.8  --   --   --  0.7  --   --   PROT 6.4*  --   --   --  6.1*  --   --   ALBUMIN 2.7*  < > 2.6* 2.8* 2.7* 2.6* 2.7*  < > = values in this interval not displayed. No results for input(s): LIPASE, AMYLASE in the last 168 hours. No results for input(s): AMMONIA in the last 168 hours. Coagulation Profile:  Recent Labs Lab 07/13/16 0420  INR 1.07   Cardiac Enzymes:  Recent Labs Lab 07/13/16 0420  CKTOTAL 13,692*   BNP (last 3 results) No results for input(s): PROBNP in the last 8760 hours. HbA1C: No results for input(s): HGBA1C in the last 72 hours. CBG:  Recent Labs Lab 07/18/16 1146 07/18/16 1609 07/18/16 1932 07/18/16 2356 07/19/16 0422  GLUCAP 156* 115* 124* 111* 112*   Lipid Profile: No results for input(s): CHOL, HDL, LDLCALC, TRIG, CHOLHDL,  LDLDIRECT in the last 72 hours. Thyroid Function Tests: No results for input(s): TSH, T4TOTAL, FREET4, T3FREE, THYROIDAB in the last 72 hours. Anemia Panel: No results for input(s): VITAMINB12, FOLATE, FERRITIN, TIBC, IRON, RETICCTPCT in the last 72 hours. Urine analysis: No results found for: COLORURINE, APPEARANCEUR, LABSPEC, PHURINE, GLUCOSEU, HGBUR, BILIRUBINUR, KETONESUR, PROTEINUR, UROBILINOGEN, NITRITE, LEUKOCYTESUR Sepsis Labs: @LABRCNTIP (procalcitonin:4,lacticidven:4)  ) Recent Results (from the past 240 hour(s))  MRSA PCR Screening     Status: None   Collection Time: 07/09/16  8:32 AM  Result Value Ref Range Status   MRSA by PCR NEGATIVE NEGATIVE Final    Comment:        The GeneXpert MRSA Assay (FDA approved for NASAL specimens only), is one component of a comprehensive MRSA colonization surveillance program. It is not intended to diagnose MRSA infection nor to guide or monitor treatment for MRSA infections.   Culture, blood (routine x 2)     Status: None   Collection Time: 07/09/16  9:09 AM  Result Value Ref Range Status   Specimen Description BLOOD RIGHT HAND  Final   Special Requests IN PEDIATRIC BOTTLE Blood Culture adequate volume  Final   Culture NO GROWTH 5 DAYS  Final   Report Status 07/14/2016 FINAL  Final  Culture, blood (routine x 2)     Status: None   Collection Time: 07/09/16  9:09 AM  Result Value Ref Range Status   Specimen Description BLOOD RIGHT HAND  Final   Special Requests IN PEDIATRIC BOTTLE Blood  Culture adequate volume  Final   Culture NO GROWTH 5 DAYS  Final   Report Status 07/14/2016 FINAL  Final         Radiology Studies: No results found.      Scheduled Meds: . calcium acetate  667 mg Oral TID WC  . Chlorhexidine Gluconate Cloth  6 each Topical Daily  . docusate sodium  100 mg Oral BID  . feeding supplement  1 Container Oral BID BM  . heparin  5,000 Units Subcutaneous Q8H  . mouth rinse  15 mL Mouth Rinse BID  .  methadone  10 mg Oral Daily  . sodium chloride flush  10-40 mL Intracatheter Q12H   Continuous Infusions: . sodium chloride 10 mL/hr at 07/16/16 2000  . sodium chloride 250 mL (07/14/16 2200)  . sodium chloride    . sodium chloride       LOS: 10 days    Time spent: 40 minutes    Mineola Duan, Roselind Messier, MD [facility] Pager [phone]   If 7PM-7AM, please contact night-coverage [email or web address] Password [email or web address] 07/19/2016, 7:03 AM

## 2016-07-20 ENCOUNTER — Encounter (HOSPITAL_COMMUNITY): Payer: Self-pay | Admitting: General Surgery

## 2016-07-20 ENCOUNTER — Inpatient Hospital Stay (HOSPITAL_COMMUNITY): Payer: BLUE CROSS/BLUE SHIELD

## 2016-07-20 HISTORY — PX: IR FLUORO GUIDE CV LINE RIGHT: IMG2283

## 2016-07-20 HISTORY — PX: IR US GUIDE VASC ACCESS RIGHT: IMG2390

## 2016-07-20 LAB — BASIC METABOLIC PANEL
Anion gap: 13 (ref 5–15)
BUN: 78 mg/dL — AB (ref 6–20)
CO2: 25 mmol/L (ref 22–32)
CREATININE: 9.78 mg/dL — AB (ref 0.61–1.24)
Calcium: 9 mg/dL (ref 8.9–10.3)
Chloride: 92 mmol/L — ABNORMAL LOW (ref 101–111)
GFR calc Af Amer: 8 mL/min — ABNORMAL LOW (ref >60)
GFR calc non Af Amer: 7 mL/min — ABNORMAL LOW (ref >60)
GLUCOSE: 97 mg/dL (ref 65–99)
POTASSIUM: 5.4 mmol/L — AB (ref 3.5–5.1)
SODIUM: 130 mmol/L — AB (ref 135–145)

## 2016-07-20 LAB — RENAL FUNCTION PANEL
ANION GAP: 14 (ref 5–15)
Albumin: 2.8 g/dL — ABNORMAL LOW (ref 3.5–5.0)
BUN: 78 mg/dL — ABNORMAL HIGH (ref 6–20)
CHLORIDE: 92 mmol/L — AB (ref 101–111)
CO2: 24 mmol/L (ref 22–32)
Calcium: 9.1 mg/dL (ref 8.9–10.3)
Creatinine, Ser: 9.52 mg/dL — ABNORMAL HIGH (ref 0.61–1.24)
GFR calc non Af Amer: 7 mL/min — ABNORMAL LOW (ref >60)
GFR, EST AFRICAN AMERICAN: 8 mL/min — AB (ref >60)
GLUCOSE: 97 mg/dL (ref 65–99)
POTASSIUM: 5.4 mmol/L — AB (ref 3.5–5.1)
Phosphorus: 10.7 mg/dL — ABNORMAL HIGH (ref 2.5–4.6)
SODIUM: 130 mmol/L — AB (ref 135–145)

## 2016-07-20 LAB — GLUCOSE, CAPILLARY
GLUCOSE-CAPILLARY: 100 mg/dL — AB (ref 65–99)
GLUCOSE-CAPILLARY: 99 mg/dL (ref 65–99)
Glucose-Capillary: 103 mg/dL — ABNORMAL HIGH (ref 65–99)
Glucose-Capillary: 108 mg/dL — ABNORMAL HIGH (ref 65–99)

## 2016-07-20 LAB — CBC
HEMATOCRIT: 30.3 % — AB (ref 39.0–52.0)
HEMOGLOBIN: 9.9 g/dL — AB (ref 13.0–17.0)
MCH: 29 pg (ref 26.0–34.0)
MCHC: 32.7 g/dL (ref 30.0–36.0)
MCV: 88.9 fL (ref 78.0–100.0)
Platelets: 332 10*3/uL (ref 150–400)
RBC: 3.41 MIL/uL — AB (ref 4.22–5.81)
RDW: 15 % (ref 11.5–15.5)
WBC: 12.1 10*3/uL — ABNORMAL HIGH (ref 4.0–10.5)

## 2016-07-20 LAB — MAGNESIUM: MAGNESIUM: 2.6 mg/dL — AB (ref 1.7–2.4)

## 2016-07-20 IMAGING — XA IR FLUORO GUIDE CV LINE*R*
1 series · 1 of 1 positions shown · non-contrast
Comparison: none

INDICATION: Renal failure

[Series 1: fl (-) angio · 1 of 1 slices shown]
[im 1/1]
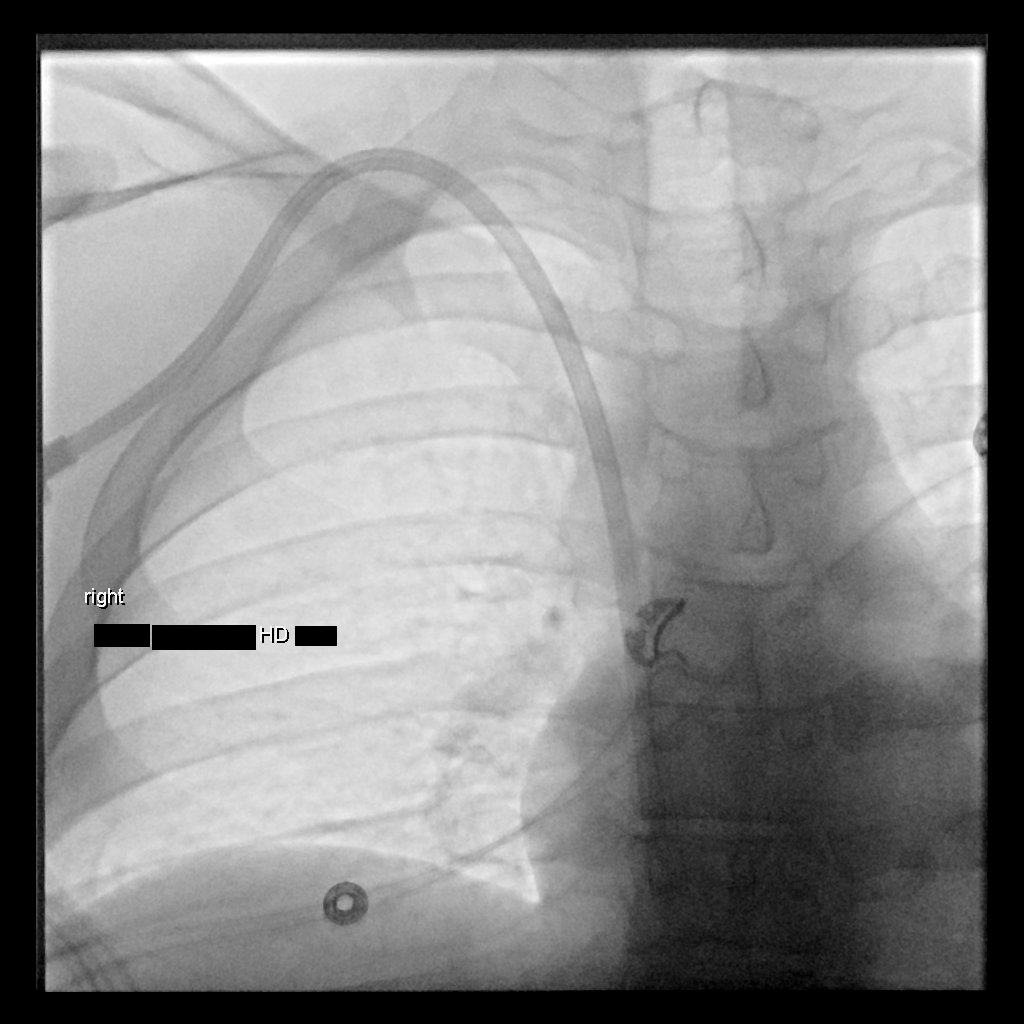

[1 of 1 positions shown; findings below may reference images not displayed]

EXAM:
TUNNELED DIALYSIS CATHETER PLACEMENT, ULTRASOUND GUIDANCE FOR
VASCULAR ACCESS

MEDICATIONS:
Ancef; The antibiotic was administered within an appropriate time
interval prior to skin puncture.

ANESTHESIA/SEDATION:
Versed 4 mg IV; Fentanyl 100 mcg IV;

Moderate Sedation Time:  20

The patient was continuously monitored during the procedure by the
interventional radiology nurse under my direct supervision.

FLUOROSCOPY TIME:  Fluoroscopy Time:  minutes 42 seconds (3 mGy).

COMPLICATIONS:
None immediate.

PROCEDURE:
Informed written consent was obtained from the patient after a
thorough discussion of the procedural risks, benefits and
alternatives. All questions were addressed. Maximal Sterile Barrier
Technique was utilized including caps, mask, sterile gowns, sterile
gloves, sterile drape, hand hygiene and skin antiseptic. A timeout
was performed prior to the initiation of the procedure.

The right neck was prepped with ChloraPrep in a sterile fashion, and
a sterile drape was applied covering the operative field. A sterile
gown and sterile gloves were used for the procedure. 1% lidocaine
into the skin and subcutaneous tissue. The jugular vein was noted to
be patent initially with ultrasound. Under sonographic guidance, a
micropuncture needle was inserted into the right IJ vein (Ultrasound
and fluoroscopic image documentation was performed). It was removed
over an 018 wire which was up-sized to an Amplatz. This was advanced
into the IVC.

A small incision was made in the right upper chest. The tunneling
device was utilized to advance the 23 centimeter tip to cuff
catheter from the chest incision and out the neck incision. A
peel-away sheath was advanced over the Amplatz wire. The leading
edge of the catheter was then advanced through the peel-away sheath.
The peel-away sheath was removed. It was flushed and instilled with
heparin. The chest incision was closed with a 0 Prolene pursestring
stitch. The neck incision was closed with a 4-0 Vicryl subcuticular
stitch.
IMPRESSION: Successful right IJ vein tunneled dialysis catheter with its tip in
the right atrium.

## 2016-07-20 MED ORDER — VANCOMYCIN HCL IN DEXTROSE 1-5 GM/200ML-% IV SOLN
1000.0000 mg | INTRAVENOUS | Status: AC
  Administered 2016-07-20: 1000 mg via INTRAVENOUS
  Filled 2016-07-20: qty 200

## 2016-07-20 MED ORDER — HEPARIN SODIUM (PORCINE) 5000 UNIT/ML IJ SOLN
5000.0000 [IU] | Freq: Three times a day (TID) | INTRAMUSCULAR | Status: DC
  Administered 2016-07-22 – 2016-08-01 (×12): 5000 [IU] via SUBCUTANEOUS
  Filled 2016-07-20 (×18): qty 1

## 2016-07-20 MED ORDER — VANCOMYCIN HCL IN DEXTROSE 1-5 GM/200ML-% IV SOLN
INTRAVENOUS | Status: AC
  Filled 2016-07-20: qty 200

## 2016-07-20 MED ORDER — FENTANYL CITRATE (PF) 100 MCG/2ML IJ SOLN
INTRAMUSCULAR | Status: AC | PRN
  Administered 2016-07-20 (×2): 50 ug via INTRAVENOUS

## 2016-07-20 MED ORDER — FENTANYL CITRATE (PF) 100 MCG/2ML IJ SOLN
INTRAMUSCULAR | Status: AC
  Filled 2016-07-20: qty 4

## 2016-07-20 MED ORDER — LIDOCAINE HCL (PF) 1 % IJ SOLN
INTRAMUSCULAR | Status: AC
  Filled 2016-07-20: qty 30

## 2016-07-20 MED ORDER — HEPARIN SODIUM (PORCINE) 1000 UNIT/ML IJ SOLN
INTRAMUSCULAR | Status: AC
  Filled 2016-07-20: qty 1

## 2016-07-20 MED ORDER — MIDAZOLAM HCL 2 MG/2ML IJ SOLN
INTRAMUSCULAR | Status: AC
  Filled 2016-07-20: qty 4

## 2016-07-20 MED ORDER — MIDAZOLAM HCL 2 MG/2ML IJ SOLN
INTRAMUSCULAR | Status: AC | PRN
  Administered 2016-07-20 (×3): 1 mg via INTRAVENOUS

## 2016-07-20 MED ORDER — MORPHINE SULFATE (PF) 4 MG/ML IV SOLN
0.5000 mg | INTRAVENOUS | Status: DC | PRN
  Administered 2016-07-20 – 2016-07-21 (×2): 0.52 mg via INTRAVENOUS
  Filled 2016-07-20 (×2): qty 1

## 2016-07-20 NOTE — Discharge Instructions (Addendum)
Central Line Dialysis Access Placement, Care After °This sheet gives you information about how to care for yourself after your procedure. Your health care provider may also give you more specific instructions. If you have problems or questions, contact your health care provider. °What can I expect after the procedure? °After the procedure, it is common to have: °· Mild pain or discomfort. °· Mild redness, swelling, or bruising around your incision. °· A small amount of blood or clear fluid coming from your incision. ° °Follow these instructions at home: °Incision care °· Follow instructions from your health care provider about how to take care of your incision. Make sure you: °? Wash your hands with soap and water before you change your bandage (dressing). If soap and water are not available, use hand sanitizer. °? Change your dressing as told by your health care provider. °? Leave stitches (sutures) in place. °· Check your incision area every day for signs of infection. Check for: °? More redness, swelling, or pain. °? More fluid or blood. °? Warmth. °? Pus or a bad smell. °· If directed, put heat on the catheter site as often as told by your health care provider. Use the heat source that your health care provider recommends, such as a moist heat pack or a heating pad. °? Place a towel between your skin and the heat source. °? Leave the heat on for 20-30 minutes. °? Remove the heat if your skin turns bright red. This is especially important if you are unable to feel pain, heat, or cold. You may have a greater risk of getting burned. °· If directed, put ice on the catheter site: °? Put ice in a plastic bag. °? Place a towel between your skin and the bag. °? Leave the ice on for 20 minutes, 2-3 times a day. °Medicines °· Take over-the-counter and prescription medicines only as told by your health care provider. °· If you were prescribed an antibiotic medicine, use it as told by your health care provider. Do not stop  using the antibiotic even if you start to feel better. °Activity °· Return to your normal activities as told by your health care provider. Ask your health care provider what activities are safe for you. °· Do not lift anything that is heavier than 10 lb (4.5 kg) until your health care provider says that this is safe. °Driving °· Do not drive for 24 hours if you were given a medicine to help you relax (sedative) during your procedure. °· Do not drive or use heavy machinery while taking prescription pain medicine. °Lifestyle °· Limit alcohol intake to no more than 1 drink a day for nonpregnant women and 2 drinks a day for men. One drink equals 12 oz of beer, 5 oz of wine, or 1½ oz of hard liquor. °· Do not use any products that contain nicotine or tobacco, such as cigarettes and e-cigarettes. If you need help quitting, ask your health care provider. °General instructions °· Do not take baths or showers, swim, or use a hot tub until your health care provider approves. You may only be allowed to take sponge baths for bathing. °· Wear compression stockings as told by your health care provider. These stockings help to prevent blood clots and reduce swelling in your legs. °· Follow instructions from your health care provider about eating or drinking restrictions. °· Keep all follow-up visits as told by your health care provider. This is important. °Contact a health care provider if: °· Your   catheter gets pulled out of place. °· Your catheter site becomes itchy. °· You develop a rash around your catheter site. °· You have more redness, swelling, or pain around your incision. °· You have more fluid or blood coming from your incision. °· Your incision area feels warm to the touch. °· You have pus or a bad smell coming from your incision. °· You have a fever. °Get help right away if: °· You become light-headed or dizzy. °· You faint. °· You have difficulty breathing. °· Your catheter gets pulled out completely. °This  information is not intended to replace advice given to you by your health care provider. Make sure you discuss any questions you have with your health care provider. °Document Released: 09/03/2003 Document Revised: 10/14/2015 Document Reviewed: 10/14/2015 °Elsevier Interactive Patient Education © 2018 [employer] ° °

## 2016-07-20 NOTE — Progress Notes (Signed)
PT Evaluation - Late Entry  Pt admitted with above diagnosis and presents to PT with functional limitations due to deficits listed below (See PT problem list). Pt needs skilled PT to maximize independence and safety to allow discharge to CIR. Pt with cognitive and physical deficits that could be addressed in CIR. Expect pt to make good progress.    07/17/16 1348  PT Visit Information  Last PT Received On 07/17/16  Assistance Needed +2  History of Present Illness Pt adm with drug overdose. Pt intubated 6/7 and self extubated 6/11. Pt with AKI and required CRRT which was stopped 6/14. To start HD on 6/16.  Pt pulled out foley with significant trauma. No PMH.  Precautions  Precautions Fall  Precaution Comments Foley due to significant urethral trauma  Restrictions  Weight Bearing Restrictions No  Home Living  Family/patient expects to be discharged to: Private residence  Living Arrangements Other relatives (grandparents)  Home Layout Two level;Bed/bath upstairs  Alternate Level Stairs-Number of Steps flight  Additional Comments Unsure of accuracy of information due to pt's cognition.  Prior Function  Level of Independence Independent  Communication  Communication HOH  Pain Assessment  Pain Assessment Faces  Faces Pain Scale 8  Pain Location Penis  Pain Descriptors / Indicators Grimacing;Guarding  Pain Intervention(s) Limited activity within patient's tolerance;Repositioned;Monitored during session  Cognition  Arousal/Alertness Awake/alert  Behavior During Therapy WFL for tasks assessed/performed  Overall Cognitive Status Impaired/Different from baseline  Area of Impairment Attention;Memory;Following commands;Safety/judgement;Problem solving  Current Attention Level Sustained  Memory Decreased recall of precautions;Decreased short-term memory  Following Commands Follows one step commands with increased time  Safety/Judgement Decreased awareness of safety;Decreased awareness of  deficits  Problem Solving Slow processing;Difficulty sequencing;Requires verbal cues;Requires tactile cues;Decreased initiation  General Comments Pt with difficulty sequencing for basic mobility  Upper Extremity Assessment  Upper Extremity Assessment Defer to OT evaluation  Lower Extremity Assessment  Lower Extremity Assessment Generalized weakness;RLE deficits/detail;LLE deficits/detail  RLE Deficits / Details Very hesistant to move due to painful groin  LLE Deficits / Details Very hesistant to move due to painful groin  Bed Mobility  Overal bed mobility Needs Assistance  Bed Mobility Supine to Sit  Supine to sit +2 for physical assistance;Mod assist  General bed mobility comments Assist to bring legs off bed, elevate trunk into sitting, and bring hips to EOB. Pt with incr difficulty due to pain  Transfers  Overall transfer level Needs assistance  Transfers Sit to/from Stand;Stand Pivot Transfers  Sit to Stand +2 physical assistance;Mod assist  Stand pivot transfers +2 physical assistance;Mod assist  General transfer comment Assist to bring hips up. Pt very slow to rise and with very wide base of support. For pivotal steps to recliner pt with flexed hips and knees and verbal/tactile cue to narrow base to bring feet inside of walker. Pt with very short, shuffling pivotal steps. HR to 140's with activity.  Balance  Overall balance assessment Needs assistance  Sitting-balance support Bilateral upper extremity supported;Feet supported  Sitting balance-Leahy Scale Poor  Sitting balance - Comments UE support likely mostly due attempt to keep pressure off of groin  Standing balance support Bilateral upper extremity supported  Standing balance-Leahy Scale Poor  Standing balance comment walker and 2 person assist for static standing  PT - End of Session  Equipment Utilized During Treatment Gait belt  Activity Tolerance Patient limited by pain  Patient left in chair;with call bell/phone within  reach;with chair alarm set  Nurse Communication Mobility status  PT Assessment  PT Recommendation/Assessment Patient needs continued PT services  PT Visit Diagnosis Difficulty in walking, not elsewhere classified (R26.2);Pain  Pain - part of body (penis)  PT Problem List Decreased strength;Decreased activity tolerance;Decreased balance;Decreased mobility;Decreased cognition;Decreased knowledge of use of DME;Decreased safety awareness;Pain  Barriers to Discharge Decreased caregiver support  Barriers to Discharge Comments Unsure of support at home  PT Plan  PT Frequency (ACUTE ONLY) Min 3X/week  PT Treatment/Interventions (ACUTE ONLY) DME instruction;Gait training;Functional mobility training;Therapeutic activities;Therapeutic exercise;Balance training;Cognitive remediation;Patient/family education  AM-PAC PT "6 Clicks" Daily Activity Outcome Measure  Difficulty turning over in bed (including adjusting bedclothes, sheets and blankets)? 1  Difficulty moving from lying on back to sitting on the side of the bed?  1  Difficulty sitting down on and standing up from a chair with arms (e.g., wheelchair, bedside commode, etc,.)? 1  Help needed moving to and from a bed to chair (including a wheelchair)? 2  Help needed walking in hospital room? 1  Help needed climbing 3-5 steps with a railing?  1  6 Click Score 7  Mobility G Code  CM  PT Recommendation  Follow Up Recommendations CIR  PT equipment Rolling walker with 5" wheels  Individuals Consulted  Consulted and Agree with Results and Recommendations Patient unable/family or caregiver not available  Acute Rehab PT Goals  Patient Stated Goal not stated  PT Goal Formulation Patient unable to participate in goal setting  Time For Goal Achievement 07/31/16  Potential to Achieve Goals Good  PT Time Calculation  PT Start Time (ACUTE ONLY) 1610  PT Stop Time (ACUTE ONLY) 0948  PT Time Calculation (min) (ACUTE ONLY) 30 min  PT General Charges  $$  ACUTE PT VISIT 1 Procedure  PT Evaluation  $PT Eval Moderate Complexity 1 Procedure  PT Treatments  $Therapeutic Activity 8-22 mins   [facility] PT [phone]

## 2016-07-20 NOTE — Progress Notes (Signed)
Subjective: Sleeping this AM.  Still agitated.  Still needs new access.  Objective: Vital signs in last 24 hours: Temp:  [97.8 F (36.6 C)-98.8 F (37.1 C)] 98.4 F (36.9 C) (06/18 0817) Pulse Rate:  [92-99] 92 (06/18 0817) Resp:  [13-18] 16 (06/18 0817) BP: (107-139)/(57-95) 113/57 (06/18 0817) SpO2:  [94 %-97 %] 95 % (06/18 0817) FiO2 (%):  [6 %] 6 % (06/17 1125) Weight:  [103.5 kg (228 lb 2.8 oz)-103.9 kg (229 lb 0.9 oz)] 103.5 kg (228 lb 2.8 oz) (06/18 0500) Weight change: -3.7 kg (-8 lb 2.5 oz)  Intake/Output from previous day: 06/17 0701 - 06/18 0700 In: 1142 [P.O.:1142] Out: 50 [Urine:50] Intake/Output this shift: No intake/output data recorded.  GEN sleeping, pale NECK no JVD PULM clear anteriorly CV III/VI systolic murmur LLSB ABD mildly distended EXT 1+ LE edema  Lab Results:  Recent Labs  07/19/16 0233 07/20/16 0250  WBC 15.1* 12.1*  HGB 10.2* 9.9*  HCT 31.1* 30.3*  PLT 276 332   BMET:   Recent Labs  07/19/16 0233 07/20/16 0250  NA 131* 130*  130*  K 4.8 5.4*  5.4*  CL 94* 92*  92*  CO2 26 24  25   GLUCOSE 111* 97  97  BUN 43* 78*  78*  CREATININE 6.27* 9.52*  9.78*  CALCIUM 8.8* 9.1  9.0   No results for input(s): PTH in the last 72 hours. Iron Studies: No results for input(s): IRON, TIBC, TRANSFERRIN, FERRITIN in the last 72 hours.  Studies/Results: No results found.  I have reviewed the patient's current medications.  Assessment/Plan: 1 AKI  Oliguric rhabdo.  No recovery.  Will place order for tunneled catheter since he continually pulls at lines.  For HD after placement.   2 Drug OD 3 Substance abuse 4 Enceph  Clearly no insight or judgment   LOS: 11 days   Wayburn Shaler 07/20/2016,8:45 AM

## 2016-07-20 NOTE — Progress Notes (Signed)
Occupational Therapy Evaluation Patient Details Name: Blake Kramer MRN: [id number] DOB: 10/29/1995 Today's Date: 07/20/2016    History of Present Illness Pt adm with drug overdose. UDS positive for opiates. Pt intubated 6/7 and self extubated 6/11. Pt with AKI and required CRRT which was stopped 6/14. To start HD on 6/16.  Pt pulled out foley with significant trauma. Also pulled out HD cath. No PMH.(per chart 10+ overdoses)   Clinical Impression   PTA, pt apparently lived with his grandparents and did not work. Pt states he "just does heroine". At end of session, pt whispered " I wish they would kill me" - nsg notified.  Eval limited by amount of blood leaking around foley tube, however, pt agreeable to OOB to chair. VSS (BP 123/65), HR 101. O2 95. Pt currently requires Mod A +2 with mobility and mod A with ADL. Pt will benefit from post acute rehab pending progress. Will follow acutely to facilitate safe DC to next venue of care.     Follow Up Recommendations  Supervision/Assistance - 24 hour (will further assess);CIR   Equipment Recommendations  3 in 1 bedside commode    Recommendations for Other Services Other (comment) (psych consult)     Precautions / Restrictions Precautions Precautions: Fall Precaution Comments: Foley due to significant urethral trauma Restrictions Weight Bearing Restrictions: No      Mobility Bed Mobility Overal bed mobility: Needs Assistance Bed Mobility: Supine to Sit     Supine to sit: Min assist        Transfers Overall transfer level: Needs assistance   Transfers: Sit to/from Stand;Stand Pivot Transfers Sit to Stand: Min assist Stand pivot transfers: +2 physical assistance;Mod assist       General transfer comment: Able to take a few steps to chair. Limited by amount of blood flowing from around foley - nsg notified    Balance Overall balance assessment: Needs assistance Sitting-balance support: Bilateral upper extremity supported;Feet  supported Sitting balance-Leahy Scale: Fair Sitting balance - Comments: UE support likely mostly due attempt to keep pressure off of groin   Standing balance support: Bilateral upper extremity supported Standing balance-Leahy Scale: Poor Standing balance comment: 2 person assist                            ADL either performed or assessed with clinical judgement   ADL Overall ADL's : Needs assistance/impaired Eating/Feeding: NPO   Grooming: Set up;Supervision/safety   Upper Body Bathing: Set up;Supervision/ safety;Sitting   Lower Body Bathing: Moderate assistance;Sit to/from stand   Upper Body Dressing : Minimal assistance;Sitting   Lower Body Dressing: Moderate assistance;Sit to/from stand   Toilet Transfer: +2 for physical assistance;Moderate assistance (simulated)   Toileting- Clothing Manipulation and Hygiene: Total assistance Toileting - Clothing Manipulation Details (indicate cue type and reason): foley     Functional mobility during ADLs: Moderate assistance;+2 for physical assistance;+2 for safety/equipment;Cueing for safety General ADL Comments: posterior lean during mobility. Appears distracted by groin pain     Vision         Perception     Praxis      Pertinent Vitals/Pain Pain Assessment: Faces Faces Pain Scale: Hurts whole lot Pain Location: Penis Pain Descriptors / Indicators: Grimacing;Guarding;Moaning Pain Intervention(s): Limited activity within patient's tolerance;Patient requesting pain meds-RN notified     Hand Dominance Right   Extremity/Trunk Assessment Upper Extremity Assessment Upper Extremity Assessment: Generalized weakness   Lower Extremity Assessment Lower Extremity Assessment: Defer to PT  evaluation   Cervical / Trunk Assessment Cervical / Trunk Assessment: Normal   Communication Communication Communication: HOH   Cognition Arousal/Alertness: Awake/alert Behavior During Therapy: Restless;Agitated Overall  Cognitive Status: Impaired/Different from baseline Area of Impairment: Attention;Memory;Following commands;Safety/judgement;Problem solving;Awareness                   Current Attention Level: Sustained Memory: Decreased recall of precautions;Decreased short-term memory Following Commands: Follows one step commands with increased time Safety/Judgement: Decreased awareness of safety;Decreased awareness of deficits Awareness: Emergent Problem Solving: Slow processing;Difficulty sequencing;Requires verbal cues;Requires tactile cues;Decreased initiation General Comments: Appears more intact than previous session. Will further assess.    General Comments       Exercises     Shoulder Instructions      Home Living Family/patient expects to be discharged to:: Unsure Living Arrangements: Other relatives (grandparents)   Type of Home: House       Home Layout: Two level;Bed/bath upstairs Alternate Level Stairs-Number of Steps: flight                 Additional Comments: Unsure of accuracy of information due to pt's cognition. Pt does not work but states he lives with his grandparents and helps them financially.      Prior Functioning/Environment Level of Independence: Independent                 OT Problem List: Decreased strength;Decreased activity tolerance;Impaired balance (sitting and/or standing);Decreased cognition;Decreased safety awareness;Decreased knowledge of use of DME or AE;Cardiopulmonary status limiting activity;Pain      OT Treatment/Interventions: Self-care/ADL training;Therapeutic exercise;DME and/or AE instruction;Energy conservation;Therapeutic activities;Cognitive remediation/compensation;Patient/family education;Balance training    OT Goals(Current goals can be found in the care plan section) Acute Rehab OT Goals Patient Stated Goal: to eat OT Goal Formulation: Patient unable to participate in goal setting Time For Goal Achievement:  08/10/16 Potential to Achieve Goals: Good  OT Frequency: Min 2X/week   Barriers to D/C: Other (comment) (unsure of caregiver support)          Co-evaluation PT/OT/SLP Co-Evaluation/Treatment: Yes Reason for Co-Treatment: Necessary to address cognition/behavior during functional activity;To address functional/ADL transfers;For patient/therapist safety   OT goals addressed during session: ADL's and self-care      AM-PAC PT "6 Clicks" Daily Activity     Outcome Measure Help from another person eating meals?: None Help from another person taking care of personal grooming?: A Little Help from another person toileting, which includes using toliet, bedpan, or urinal?: A Lot Help from another person bathing (including washing, rinsing, drying)?: A Lot Help from another person to put on and taking off regular upper body clothing?: A Little Help from another person to put on and taking off regular lower body clothing?: A Lot 6 Click Score: 16   End of Session Equipment Utilized During Treatment: Gait belt Nurse Communication: Mobility status;Other (comment) (need to check foley)  Activity Tolerance: Patient limited by pain Patient left: in chair;with call bell/phone within reach;with nursing/sitter in room  OT Visit Diagnosis: Unsteadiness on feet (R26.81);Muscle weakness (generalized) (M62.81);Other symptoms and signs involving cognitive function                Time: 1001-1030 OT Time Calculation (min): 29 min Charges:  OT General Charges $OT Visit: 1 Procedure OT Evaluation $OT Eval Moderate Complexity: 1 Procedure G-Codes:     Blake Kramer, OT/L  [phone] 07/20/2016  Blake Kramer,Blake Kramer 07/20/2016, 10:50 AM

## 2016-07-20 NOTE — Progress Notes (Addendum)
Off unit to IR for HD cath placement.  Mother and sister present at bedside prior to departure; updated both on POC with understanding verbalized.  Pt updated as well with reinforcement needed.  Alert to person, place, and situation; disoriented to time.  Off unit in stable condition at approx. 1352.

## 2016-07-20 NOTE — Progress Notes (Signed)
Physical Therapy Treatment Patient Details Name: Blake Kramer [place]aree MRN: [id number] DOB: [birth year] Today's Date: 07/20/2016    History of Present Illness Pt adm with drug overdose. UDS positive for opiates. Pt intubated 6/7 and self extubated 6/11. Pt with AKI and required CRRT which was stopped 6/14. To start HD on 6/16.  Pt pulled out foley with significant trauma. Also pulled out HD cath. No PMH.(per chart 10+ overdoses)    PT Comments    Pt with improved cognition and less agitation from initial evaluation however pt remains slightly confused about situation. Pt easily agitated and demo's impaired processing and decreased safety awareness. Pt's OOB mobility limited this date due to excessive bleeding from penis. Pt to benefit from CIR to address both cognitive and functional deficits. Acute PT to con't to follow.   Follow Up Recommendations  CIR     Equipment Recommendations  Rolling walker with 5" wheels    Recommendations for Other Services       Precautions / Restrictions Precautions Precautions: Fall Precaution Comments: Foley due to significant urethral trauma Restrictions Weight Bearing Restrictions: No    Mobility  Bed Mobility Overal bed mobility: Needs Assistance Bed Mobility: Supine to Sit     Supine to sit: Min assist     General bed mobility comments: directional v/c's, increased time due to pain in penis  Transfers Overall transfer level: Needs assistance Equipment used: 2 person hand held assist Transfers: Sit to/from U[employer] CorporationStand;Stand Pivot Transfers Sit to Stand: Min assist Stand pivot transfers: +2 physical assistance;Mod assist       General transfer comment: upon standing pt with blood draining from penis limiting ambulation to steps to chair only as the blood was difficult to stop  Ambulation/Gait             General Gait Details: unable to due to excessive bleeding from penis   Stairs            Wheelchair Mobility    Modified Rankin  (Stroke Patients Only)       Balance Overall balance assessment: Needs assistance Sitting-balance support: Bilateral upper extremity supported;Feet supported Sitting balance-Leahy Scale: Fair Sitting balance - Comments: UE support likely mostly due attempt to keep pressure off of groin   Standing balance support: Bilateral upper extremity supported Standing balance-Leahy Scale: Poor Standing balance comment: 2 person assist                             Cognition Arousal/Alertness: Awake/alert Behavior During Therapy: Restless;Agitated Overall Cognitive Status: Impaired/Different from baseline Area of Impairment: Attention;Memory;Following commands;Safety/judgement;Problem solving;Awareness                   Current Attention Level: Sustained Memory: Decreased recall of precautions;Decreased short-term memory Following Commands: Follows one step commands with increased time Safety/Judgement: Decreased awareness of safety;Decreased awareness of deficits Awareness: Emergent Problem Solving: Slow processing;Difficulty sequencing;Requires verbal cues;Requires tactile cues;Decreased initiation General Comments: pt very irritated about not being able to eat. pt using predominately foul language, but appears to be improved since inital eval      Exercises      General Comments General comments (skin integrity, edema, etc.): pt with excessive bleeding from penis      Pertinent Vitals/Pain Pain Assessment: Faces Faces Pain Scale: Hurts whole lot Pain Location: Penis Pain Descriptors / Indicators: Grimacing;Guarding;Moaning Pain Intervention(s): Limited activity within patient's tolerance    Home Living Family/patient expects to be discharged to:: Unsure Living  Arrangements: Other relatives (grandparents)   Type of Home: House     Home Layout: Two level;Bed/bath upstairs   Additional Comments: Unsure of accuracy of information due to pt's cognition. Pt does  not work but states he lives with his grandparents and helps them financially.    Prior Function Level of Independence: Independent          PT Goals (current goals can now be found in the care plan section) Acute Rehab PT Goals Patient Stated Goal: to eat PT Goal Formulation: Patient unable to participate in goal setting Time For Goal Achievement: 07/31/16 Potential to Achieve Goals: Good Progress towards PT goals: Progressing toward goals    Frequency    Min 3X/week      PT Plan Current plan remains appropriate    Co-evaluation PT/OT/SLP Co-Evaluation/Treatment: Yes Reason for Co-Treatment: Necessary to address cognition/behavior during functional activity PT goals addressed during session: Mobility/safety with mobility OT goals addressed during session: ADL's and self-care      AM-PAC PT "6 Clicks" Daily Activity  Outcome Measure  Difficulty turning over in bed (including adjusting bedclothes, sheets and blankets)?: None Difficulty moving from lying on back to sitting on the side of the bed? : A Little Difficulty sitting down on and standing up from a chair with arms (e.g., wheelchair, bedside commode, etc,.)?: A Little Help needed moving to and from a bed to chair (including a wheelchair)?: A Lot Help needed walking in hospital room?: A Lot Help needed climbing 3-5 steps with a railing? : A Lot 6 Click Score: 16    End of Session Equipment Utilized During Treatment: Gait belt Activity Tolerance: Patient limited by pain Patient left: in chair;with call bell/phone within reach;with chair alarm set Nurse Communication: Mobility status PT Visit Diagnosis: Difficulty in walking, not elsewhere classified (R26.2);Pain Pain - part of body:  (penis)     Time: 1000-1030 PT Time Calculation (min) (ACUTE ONLY): 30 min  Charges:  $Therapeutic Activity: 8-22 mins                    G Codes:       Lewis Shock, PT, DPT Pager #: [phone] Office #:  [phone]    Eisa Conaway M Haydn Hutsell 07/20/2016, 1:01 PM

## 2016-07-20 NOTE — Care Management Note (Addendum)
Case Management Note  Patient Details  Name: Blake BoutonLindon Runkle MRN: [id number] Date of Birth: 1995/03/22  Subjective/Objective:      Pt admitted post drug overdose              Action/Plan:  Pt remains intubated and on CRRT.  CSW consulted for substance abuse   Expected Discharge Date:                  Expected Discharge Plan:     In-House Referral:  Clinical Social Work  Discharge planning Services     Post Acute Care Choice:    Choice offered to:     DME Arranged:    DME Agency:     HH Arranged:    HH Agency:     Status of Service:     If discussed at [employer] Meetings, dates discussed:    Additional Comments: 07/20/2016  Pt going for tunnel HD cath today - continues to pull at all lines.  Impaired cognition is the barrier to proceeding with Psych consult  07/19/16 Pt remains on CRRT - now extubated.  Remains combative and agitated and therefore on continuous Precedex.  Discussed in LOS 6/12 - pt remains appropriate for continued stay Cherylann ParrClaxton, Lynze Reddy S, RN 07/20/2016, 2:12 PM

## 2016-07-20 NOTE — Progress Notes (Signed)
Returned to unit in stable condition from IR.  VSS.  Will continue to monitor.

## 2016-07-20 NOTE — Progress Notes (Signed)
[facility] TEAM 1 - Stepdown/ICU TEAM  Blake Kramer  ONG:[id number] DOB: 05-29-1995 DOA: 07/09/2016 PCP: No primary care provider on file.    Brief Narrative:  21 y.o.M  Hx Drug abuse (10+ episodes of overdose) who was brought to [facility] ICU 6/7 from [place] after a presumed drug overdose. EMS was dispatched to pt's location where they found him lying supine on the floor, pale, diaphoretic. Girlfriend informed EMS that she thought pt had might have been down for roughly 10 hours. Per police, there had been several cases of fentanyl and heroine overdoses in the community and per their report, scene looked very similar; therefore, they were suspecting the same in this instance. He was given 3 rounds of narcan with little response.  In the ED he was intubated for airway protection. UDS was positive for opiates. CT head negative. He developed AKI requiring CRRT.  Subjective: Pt is alert and conversant.  Deflects most every question w/ jokes/humor.  Does not appear to be in acute resp distress.  Does not appear to grasp serious nature of his current situation.  Is not able to provide a reliable ROS presently.    Assessment & Plan:  Illicit Drug overdose - intent unclear  Pt now more alert and lucid, though deflects serious conversation w/ comedy - will consult Psych 6/19 to determine if any suicidal intent/forced inpt tx indicated if he is more communicative at that time   Acute resp failure with hypoxia Resolved -due to OD   Acute renal failure on HD Due to drug overdose > rhabdomyolysis - tunneled HD cath placed today - Nephrology attending to HD   Rhabdomyolysis resolved  Urethral trauma  Patient self DC'd Foley monitor for signs of infection - three-way irrigation catheter placed by Urology - plan for at least 4 wks per urology - do NOTremove Foley catheter   Shock Liver / Transaminitis  Due to above - resolved   Acute blood loss due to urethral trauma No signif drop in Hgb     Acute encephalopathy  due to intentional drug overdose + sedation (Heroin and Fentanyl) - methadone 10 mg daily   DVT prophylaxis: SQ heparin  Code Status: FULL CODE Family Communication: spoke w/ sister and mother at bedside Disposition Plan: SDU  Consultants:  Urology  Nephrology PCCM  IR  Significant Events: 6/07 Admit with presumed overdose, CVVHD started  6/08 Remains obtunded 6/11 self extubated 6/11 urethral injury after traumatic removal of Foley catheter 6/18 tunneled HD cath inserted by IR   Antimicrobials:  Rocephin 6/8 > 6/14  Objective: Blood pressure 125/68, pulse 86, temperature 97.6 F (36.4 C), temperature source Oral, resp. rate 16, height 6' (1.829 m), weight 103.5 kg (228 lb 2.8 oz), SpO2 93 %.  Intake/Output Summary (Last 24 hours) at 07/20/16 1719 Last data filed at 07/20/16 0504  Gross per 24 hour  Intake              582 ml  Output               50 ml  Net              532 ml   Filed Weights   07/19/16 0330 07/19/16 1900 07/20/16 0500  Weight: 103.4 kg (227 lb 15.3 oz) 103.9 kg (229 lb 0.9 oz) 103.5 kg (228 lb 2.8 oz)    Examination: General: No acute respiratory distress Lungs: Clear to auscultation bilaterally without wheezes or crackles Cardiovascular: Regular rate and rhythm without murmur  gallop or rub normal S1 and S2 Abdomen: Nontender, nondistended, soft, bowel sounds positive, no rebound, no ascites, no appreciable mass Extremities: No significant cyanosis, clubbing, or edema bilateral lower extremities  CBC:  Recent Labs Lab 07/16/16 0357 07/17/16 0400 07/18/16 0415 07/19/16 0233 07/20/16 0250  WBC 14.0* 15.6* 15.8* 15.1* 12.1*  HGB 10.7* 10.5* 10.2* 10.2* 9.9*  HCT 32.7* 32.5* 31.6* 31.1* 30.3*  MCV 91.1 90.8 90.3 89.4 88.9  PLT 165 203 205 276 332   Basic Metabolic Panel:  Recent Labs Lab 07/14/16 0405  07/15/16 0425  07/16/16 0357  07/16/16 1600 07/17/16 0400 07/18/16 0415 07/19/16 0233 07/20/16 0250   NA 137  < > 136  < >  --   < > 133* 131* 132* 131* 130*  130*  K 4.6  < > 4.8  < >  --   < > 4.6 4.7 5.6* 4.8 5.4*  5.4*  CL 102  < > 101  < >  --   < > 99* 98* 97* 94* 92*  92*  CO2 24  < > 25  < >  --   < > 27 23 25 26 24  25   GLUCOSE 109*  < > 93  < >  --   < > 118* 115* 101* 111* 97  97  BUN 48*  < > 31*  < >  --   < > 23* 42* 75* 43* 78*  78*  CREATININE 4.22*  < > 2.84*  < >  --   < > 2.68* 4.84* 7.96* 6.27* 9.52*  9.78*  CALCIUM 8.4*  < > 8.6*  < >  --   < > 8.4* 8.7* 8.8* 8.8* 9.1  9.0  MG 2.9*  --  2.8*  --  2.7*  --   --   --   --   --  2.6*  PHOS 4.0  4.0  < > 3.6  < >  --   < > 2.6 4.8* 7.8* 7.8* 10.7*  < > = values in this interval not displayed. GFR: Estimated Creatinine Clearance: 15.4 mL/min (A) (by C-G formula based on SCr of 9.52 mg/dL (H)).  Liver Function Tests:  Recent Labs Lab 07/15/16 0425  07/16/16 1600 07/17/16 0400 07/18/16 0415 07/19/16 0233 07/20/16 0250  AST 246*  --   --  104*  --   --   --   ALT 536*  --   --  223*  --   --   --   ALKPHOS 79  --   --  94  --   --   --   BILITOT 0.8  --   --  0.7  --   --   --   PROT 6.4*  --   --  6.1*  --   --   --   ALBUMIN 2.7*  < > 2.8* 2.7* 2.6* 2.7* 2.8*  < > = values in this interval not displayed.  CBG:  Recent Labs Lab 07/19/16 1614 07/19/16 1945 07/19/16 2334 07/20/16 0312 07/20/16 0837  GLUCAP 95 95 98 103* 100*    Scheduled Meds: . calcium acetate  667 mg Oral TID WC  . Chlorhexidine Gluconate Cloth  6 each Topical Daily  . docusate sodium  100 mg Oral BID  . feeding supplement  1 Container Oral BID BM  . [START ON 07/21/2016] heparin  5,000 Units Subcutaneous Q8H  . mouth rinse  15 mL Mouth Rinse BID  . methadone  10 mg Oral Daily  . sodium chloride flush  10-40 mL Intracatheter Q12H     LOS: 11 days   Lonia BloodJeffrey T. McClung, MD [facility] Office  [phone] Pager - Text Page per Amion as per below:  On-Call/Text Page:      [email or web address]      password [email or web address]  If  7PM-7AM, please contact night-coverage [email or web address] Password [email or web address] [email or web address] 07/20/2016, 5:19 PM

## 2016-07-20 NOTE — Consult Note (Signed)
Chief Complaint: acute renal failure, needing HD  Referring Physician:Dr. Madelon Lips  Supervising Physician: Marybelle Killings  Patient Status: [facility] - In-pt  HPI: Blake Kramer is a 21 y.o. male with a history of multiple overdoses, substance abuse, who now have uremia and is in need of HD.  He did have a temporary HD catheter, but got pulled out.  Nephrology would like a perm cath placed so they can initiate HD given his uremia.    Past Medical History:  Past Medical History:  Diagnosis Date  . Drug abuse, IV    Opioid  . Substance abuse    Alcohol and Heroine    Past Surgical History: History reviewed. No pertinent surgical history.  Family History: History reviewed. No pertinent family history.  Social History:  reports that he has been smoking Cigarettes.  He has a 5.00 pack-year smoking history. He has never used smokeless tobacco. He reports that he drinks alcohol. He reports that he uses drugs, including Heroin and IV.  Allergies:  Allergies  Allergen Reactions  . Penicillins     Medications: Medications reviewed in epic  Please HPI for pertinent positives, otherwise complete 10 system ROS negative, except penile bleeding from pulling foley catheter out.    Mallampati Score: MD Evaluation Airway: WNL Heart: WNL Abdomen: WNL Chest/ Lungs: WNL ASA  Classification: 3 Mallampati/Airway Score: Two  Physical Exam: BP (!) 113/57 (BP Location: Left Arm)   Pulse 92   Temp 98.4 F (36.9 C) (Oral)   Resp 16   Ht 6' (1.829 m)   Wt 228 lb 2.8 oz (103.5 kg)   SpO2 95%   BMI 30.95 kg/m  Body mass index is 30.95 kg/m. General: at times, confused, WD, WN white male who is laying sitting his a chair in NAD HEENT: head is normocephalic, atraumatic.  Sclera are noninjected.  PERRL.  Ears and nose without any masses or lesions.  Mouth is pink and moist Heart: regular, rate, and rhythm.  Normal s1,s2. No obvious murmurs, gallops, or rubs noted.  Palpable radial and  pedal pulses bilaterally Lungs: CTAB, no wheezes, rhonchi, or rales noted.  Respiratory effort nonlabored Abd: soft, NT, ND, +BS, no masses, hernias, or organomegaly GU: wash rag over penis secondary to bleeding from foley catheter trauma Psych: Alert and orientation is variable.   Labs: Results for orders placed or performed during the hospital encounter of 07/09/16 (from the past 48 hour(s))  Glucose, capillary     Status: Abnormal   Collection Time: 07/18/16  4:09 PM  Result Value Ref Range   Glucose-Capillary 115 (H) 65 - 99 mg/dL   Comment 1 Capillary Specimen    Comment 2 Notify RN   Glucose, capillary     Status: Abnormal   Collection Time: 07/18/16  7:32 PM  Result Value Ref Range   Glucose-Capillary 124 (H) 65 - 99 mg/dL  Glucose, capillary     Status: Abnormal   Collection Time: 07/18/16 11:56 PM  Result Value Ref Range   Glucose-Capillary 111 (H) 65 - 99 mg/dL   Comment 1 Notify RN   Renal function panel     Status: Abnormal   Collection Time: 07/19/16  2:33 AM  Result Value Ref Range   Sodium 131 (L) 135 - 145 mmol/L   Potassium 4.8 3.5 - 5.1 mmol/L   Chloride 94 (L) 101 - 111 mmol/L   CO2 26 22 - 32 mmol/L   Glucose, Bld 111 (H) 65 - 99 mg/dL  BUN 43 (H) 6 - 20 mg/dL   Creatinine, Ser 6.27 (H) 0.61 - 1.24 mg/dL   Calcium 8.8 (L) 8.9 - 10.3 mg/dL   Phosphorus 7.8 (H) 2.5 - 4.6 mg/dL   Albumin 2.7 (L) 3.5 - 5.0 g/dL   GFR calc non Af Amer 12 (L) >60 mL/min   GFR calc Af Amer 13 (L) >60 mL/min    Comment: (NOTE) The eGFR has been calculated using the CKD EPI equation. This calculation has not been validated in all clinical situations. eGFR's persistently <60 mL/min signify possible Chronic Kidney Disease.    Anion gap 11 5 - 15  CBC     Status: Abnormal   Collection Time: 07/19/16  2:33 AM  Result Value Ref Range   WBC 15.1 (H) 4.0 - 10.5 K/uL   RBC 3.48 (L) 4.22 - 5.81 MIL/uL   Hemoglobin 10.2 (L) 13.0 - 17.0 g/dL   HCT 31.1 (L) 39.0 - 52.0 %   MCV  89.4 78.0 - 100.0 fL   MCH 29.3 26.0 - 34.0 pg   MCHC 32.8 30.0 - 36.0 g/dL   RDW 15.3 11.5 - 15.5 %   Platelets 276 150 - 400 K/uL  Glucose, capillary     Status: Abnormal   Collection Time: 07/19/16  4:22 AM  Result Value Ref Range   Glucose-Capillary 112 (H) 65 - 99 mg/dL   Comment 1 Notify RN   Glucose, capillary     Status: None   Collection Time: 07/19/16  8:06 AM  Result Value Ref Range   Glucose-Capillary 98 65 - 99 mg/dL   Comment 1 Capillary Specimen    Comment 2 Notify RN   Glucose, capillary     Status: Abnormal   Collection Time: 07/19/16 12:22 PM  Result Value Ref Range   Glucose-Capillary 102 (H) 65 - 99 mg/dL  Glucose, capillary     Status: None   Collection Time: 07/19/16  4:14 PM  Result Value Ref Range   Glucose-Capillary 95 65 - 99 mg/dL  Glucose, capillary     Status: None   Collection Time: 07/19/16  7:45 PM  Result Value Ref Range   Glucose-Capillary 95 65 - 99 mg/dL   Comment 1 Notify RN   Glucose, capillary     Status: None   Collection Time: 07/19/16 11:34 PM  Result Value Ref Range   Glucose-Capillary 98 65 - 99 mg/dL   Comment 1 Notify RN   Renal function panel     Status: Abnormal   Collection Time: 07/20/16  2:50 AM  Result Value Ref Range   Sodium 130 (L) 135 - 145 mmol/L   Potassium 5.4 (H) 3.5 - 5.1 mmol/L   Chloride 92 (L) 101 - 111 mmol/L   CO2 24 22 - 32 mmol/L   Glucose, Bld 97 65 - 99 mg/dL   BUN 78 (H) 6 - 20 mg/dL   Creatinine, Ser 9.52 (H) 0.61 - 1.24 mg/dL   Calcium 9.1 8.9 - 10.3 mg/dL   Phosphorus 10.7 (H) 2.5 - 4.6 mg/dL   Albumin 2.8 (L) 3.5 - 5.0 g/dL   GFR calc non Af Amer 7 (L) >60 mL/min   GFR calc Af Amer 8 (L) >60 mL/min    Comment: (NOTE) The eGFR has been calculated using the CKD EPI equation. This calculation has not been validated in all clinical situations. eGFR's persistently <60 mL/min signify possible Chronic Kidney Disease.    Anion gap 14 5 - 15  CBC  Status: Abnormal   Collection Time: 07/20/16   2:50 AM  Result Value Ref Range   WBC 12.1 (H) 4.0 - 10.5 K/uL   RBC 3.41 (L) 4.22 - 5.81 MIL/uL   Hemoglobin 9.9 (L) 13.0 - 17.0 g/dL   HCT 30.3 (L) 39.0 - 52.0 %   MCV 88.9 78.0 - 100.0 fL   MCH 29.0 26.0 - 34.0 pg   MCHC 32.7 30.0 - 36.0 g/dL   RDW 15.0 11.5 - 15.5 %   Platelets 332 150 - 400 K/uL  Basic metabolic panel     Status: Abnormal   Collection Time: 07/20/16  2:50 AM  Result Value Ref Range   Sodium 130 (L) 135 - 145 mmol/L   Potassium 5.4 (H) 3.5 - 5.1 mmol/L   Chloride 92 (L) 101 - 111 mmol/L   CO2 25 22 - 32 mmol/L   Glucose, Bld 97 65 - 99 mg/dL   BUN 78 (H) 6 - 20 mg/dL   Creatinine, Ser 9.78 (H) 0.61 - 1.24 mg/dL    Comment: DELTA CHECK NOTED   Calcium 9.0 8.9 - 10.3 mg/dL   GFR calc non Af Amer 7 (L) >60 mL/min   GFR calc Af Amer 8 (L) >60 mL/min    Comment: (NOTE) The eGFR has been calculated using the CKD EPI equation. This calculation has not been validated in all clinical situations. eGFR's persistently <60 mL/min signify possible Chronic Kidney Disease.    Anion gap 13 5 - 15  Magnesium     Status: Abnormal   Collection Time: 07/20/16  2:50 AM  Result Value Ref Range   Magnesium 2.6 (H) 1.7 - 2.4 mg/dL  Glucose, capillary     Status: Abnormal   Collection Time: 07/20/16  3:12 AM  Result Value Ref Range   Glucose-Capillary 103 (H) 65 - 99 mg/dL   Comment 1 Notify RN   Glucose, capillary     Status: Abnormal   Collection Time: 07/20/16  8:37 AM  Result Value Ref Range   Glucose-Capillary 100 (H) 65 - 99 mg/dL    Imaging: No results found.  Assessment/Plan 1. Acute renal failure We will plan to proceed with perm cath placement today.  Vancomycin has been ordered for the procedure.  I have contacted his sister, Minette Headland, for consent given his encephalopathy.   Risks and Benefits discussed with the patient's sister including, but not limited to bleeding, infection, vascular injury, pneumothorax which may require chest tube placement, air  embolism or even death All of the patient's sister's questions were answered, patient's sister is agreeable to proceed. Consent signed and in chart.   Thank you for this interesting consult.  I greatly enjoyed meeting Ahan Eisenberger and look forward to participating in their care.  A copy of this report was sent to the requesting provider on this date.  Electronically Signed: Henreitta Cea 07/20/2016, 12:57 PM   I spent a total of 40 Minutes    in face to face in clinical consultation, greater than 50% of which was counseling/coordinating care for acute renal failure

## 2016-07-21 LAB — CBC
HCT: 25.9 % — ABNORMAL LOW (ref 39.0–52.0)
HEMOGLOBIN: 8.7 g/dL — AB (ref 13.0–17.0)
MCH: 29.4 pg (ref 26.0–34.0)
MCHC: 33.6 g/dL (ref 30.0–36.0)
MCV: 87.5 fL (ref 78.0–100.0)
Platelets: 368 10*3/uL (ref 150–400)
RBC: 2.96 MIL/uL — AB (ref 4.22–5.81)
RDW: 14.6 % (ref 11.5–15.5)
WBC: 10.6 10*3/uL — ABNORMAL HIGH (ref 4.0–10.5)

## 2016-07-21 LAB — CBC WITH DIFFERENTIAL/PLATELET
Basophils Absolute: 0 10*3/uL (ref 0.0–0.1)
Basophils Relative: 0 %
EOS PCT: 2 %
Eosinophils Absolute: 0.2 10*3/uL (ref 0.0–0.7)
HCT: 27.8 % — ABNORMAL LOW (ref 39.0–52.0)
Hemoglobin: 9.2 g/dL — ABNORMAL LOW (ref 13.0–17.0)
LYMPHS ABS: 1.6 10*3/uL (ref 0.7–4.0)
Lymphocytes Relative: 14 %
MCH: 28.8 pg (ref 26.0–34.0)
MCHC: 33.1 g/dL (ref 30.0–36.0)
MCV: 87.1 fL (ref 78.0–100.0)
MONOS PCT: 3 %
Monocytes Absolute: 0.3 10*3/uL (ref 0.1–1.0)
NEUTROS ABS: 9 10*3/uL — AB (ref 1.7–7.7)
Neutrophils Relative %: 81 %
PLATELETS: 360 10*3/uL (ref 150–400)
RBC: 3.19 MIL/uL — ABNORMAL LOW (ref 4.22–5.81)
RDW: 14.5 % (ref 11.5–15.5)
WBC: 11.1 10*3/uL — ABNORMAL HIGH (ref 4.0–10.5)

## 2016-07-21 LAB — RENAL FUNCTION PANEL
ANION GAP: 18 — AB (ref 5–15)
Albumin: 2.6 g/dL — ABNORMAL LOW (ref 3.5–5.0)
BUN: 114 mg/dL — ABNORMAL HIGH (ref 6–20)
CHLORIDE: 91 mmol/L — AB (ref 101–111)
CO2: 20 mmol/L — AB (ref 22–32)
CREATININE: 12.55 mg/dL — AB (ref 0.61–1.24)
Calcium: 8.6 mg/dL — ABNORMAL LOW (ref 8.9–10.3)
GFR calc non Af Amer: 5 mL/min — ABNORMAL LOW (ref >60)
GFR, EST AFRICAN AMERICAN: 6 mL/min — AB (ref >60)
GLUCOSE: 100 mg/dL — AB (ref 65–99)
Phosphorus: 12.1 mg/dL — ABNORMAL HIGH (ref 2.5–4.6)
Potassium: 6 mmol/L — ABNORMAL HIGH (ref 3.5–5.1)
Sodium: 129 mmol/L — ABNORMAL LOW (ref 135–145)

## 2016-07-21 MED ORDER — SEVELAMER CARBONATE 800 MG PO TABS
1600.0000 mg | ORAL_TABLET | Freq: Three times a day (TID) | ORAL | Status: DC
  Administered 2016-07-21 – 2016-07-31 (×27): 1600 mg via ORAL
  Filled 2016-07-21 (×30): qty 2

## 2016-07-21 MED ORDER — MORPHINE SULFATE (PF) 2 MG/ML IV SOLN
2.0000 mg | INTRAVENOUS | Status: DC | PRN
  Administered 2016-07-21 – 2016-07-22 (×2): 2 mg via INTRAVENOUS
  Filled 2016-07-21 (×2): qty 1

## 2016-07-21 MED ORDER — LIDOCAINE HCL (PF) 1 % IJ SOLN: 5.0000 mL | INTRAMUSCULAR | Status: DC | PRN

## 2016-07-21 MED ORDER — ALTEPLASE 2 MG IJ SOLR
2.0000 mg | Freq: Once | INTRAMUSCULAR | Status: DC | PRN
Start: 2016-07-21 — End: 2016-07-21

## 2016-07-21 MED ORDER — SODIUM CHLORIDE 0.9 % IV SOLN: 100.0000 mL | INTRAVENOUS | Status: DC | PRN

## 2016-07-21 MED ORDER — LIDOCAINE-PRILOCAINE 2.5-2.5 % EX CREA: 1.0000 "application " | TOPICAL_CREAM | CUTANEOUS | Status: DC | PRN

## 2016-07-21 MED ORDER — HEPARIN SODIUM (PORCINE) 1000 UNIT/ML DIALYSIS: 1000.0000 [IU] | INTRAMUSCULAR | Status: DC | PRN

## 2016-07-21 MED ORDER — PENTAFLUOROPROP-TETRAFLUOROETH EX AERO: 1.0000 "application " | INHALATION_SPRAY | CUTANEOUS | Status: DC | PRN

## 2016-07-21 MED ORDER — HEPARIN SODIUM (PORCINE) 1000 UNIT/ML DIALYSIS
100.0000 [IU]/kg | INTRAMUSCULAR | Status: DC | PRN
  Filled 2016-07-21: qty 11

## 2016-07-21 NOTE — Progress Notes (Signed)
PROGRESS NOTE    Blake Kramer  BJY:[id number] DOB: 1995-09-19 DOA: 07/09/2016 PCP: No primary care provider on file.  Outpatient Specialists:     Brief Narrative:  21 ? Admit by PCCM from [place] with OD, Fentanyl/morphine Intubated for airway protxn Narcan unrespnsive Lactate 10.4, SCr 4.4, K 7.1, AST 1453, ALT 1008, AG 23, WBC 18.9, Hgb 17.8, Trop 4.48.  UDS positive for opiates    Assessment & Plan:   Active Problems:   Drug overdose   AKI (acute kidney injury) (HCC)   Acute respiratory failure with hypoxia (HCC)   Urethral bleeding   Illicit Drug overdose - intent unclear  Pt now more alert and lucid, though deflects serious conversation w/ comedy consult Psych 6/19 to determine if any suicidal intent/forced inpt tx indicated if he is more communicative at that time   Acute resp failure with hypoxia Resolved -due to OD   Acute renal failure on HD Due to drug overdose > rhabdomyolysis - tunneled HD cath placed 6/18 Nephrology attending to HD   Rhabdomyolysis resolved  Urethral trauma  Patient self DC'd Foley  At dilaysis on 6/19 expeirenced pain and found to be in pool of blood-this seems to have ceased flsuhed with 60 cc saline and got back dark blood Cbc STAT Dr. Berneice Heinrich of urology aware and will evalaute him -starting CBI  three-way irrigation catheter placed by Urology  -plan for at least 4 wksper urology  - do NOTremove Foley catheter  Shock Liver / Transaminitis Due to above - resolved   Acute blood loss due to urethral trauma See above Await CBC  Acute encephalopathy  due to intentionaldrug overdose + sedation(Heroin and Fentanyl) - methadone 10 mg daily givien prn MOrphine 2 mg for now for severe pain Will d/c iv Opaites in am     Subjective: Claims in pain Seems comfortable laying in bed Events of bleeding noted He is currently having meatus cleaned  Objective: Vitals:   07/21/16 1030 07/21/16 1100 07/21/16 1130  07/21/16 1230  BP: 117/70 130/70 124/75 122/65  Pulse: 88 90 90 94  Resp:      Temp:      TempSrc:      SpO2:      Weight:      Height:        Intake/Output Summary (Last 24 hours) at 07/21/16 1353 Last data filed at 07/21/16 0900  Gross per 24 hour  Intake              357 ml  Output                0 ml  Net              357 ml   Filed Weights   07/19/16 1900 07/20/16 0500 07/21/16 0841  Weight: 103.9 kg (229 lb 0.9 oz) 103.5 kg (228 lb 2.8 oz) 106.8 kg (235 lb 7.2 oz)    Examination:  Obese pleaant in some distress, no pallor no ict s1 s 2 slight tachy abd soft nt no rebound no guard Urethra has dried blood Neuro gorssly intact, but HOH    Data Reviewed: I have personally reviewed following labs and imaging studies  CBC:  Recent Labs Lab 07/17/16 0400 07/18/16 0415 07/19/16 0233 07/20/16 0250 07/21/16 0514  WBC 15.6* 15.8* 15.1* 12.1* 10.6*  HGB 10.5* 10.2* 10.2* 9.9* 8.7*  HCT 32.5* 31.6* 31.1* 30.3* 25.9*  MCV 90.8 90.3 89.4 88.9 87.5  PLT 203 205 276 332 368  Basic Metabolic Panel:  Recent Labs Lab 07/15/16 0425  07/16/16 0357  07/17/16 0400 07/18/16 0415 07/19/16 0233 07/20/16 0250 07/21/16 0514  NA 136  < >  --   < > 131* 132* 131* 130*  130* 129*  K 4.8  < >  --   < > 4.7 5.6* 4.8 5.4*  5.4* 6.0*  CL 101  < >  --   < > 98* 97* 94* 92*  92* 91*  CO2 25  < >  --   < > 23 25 26 24  25  20*  GLUCOSE 93  < >  --   < > 115* 101* 111* 97  97 100*  BUN 31*  < >  --   < > 42* 75* 43* 78*  78* 114*  CREATININE 2.84*  < >  --   < > 4.84* 7.96* 6.27* 9.52*  9.78* 12.55*  CALCIUM 8.6*  < >  --   < > 8.7* 8.8* 8.8* 9.1  9.0 8.6*  MG 2.8*  --  2.7*  --   --   --   --  2.6*  --   PHOS 3.6  < >  --   < > 4.8* 7.8* 7.8* 10.7* 12.1*  < > = values in this interval not displayed. GFR: Estimated Creatinine Clearance: 11.9 mL/min (A) (by C-G formula based on SCr of 12.55 mg/dL (H)). Liver Function Tests:  Recent Labs Lab 07/15/16 0425   07/17/16 0400 07/18/16 0415 07/19/16 0233 07/20/16 0250 07/21/16 0514  AST 246*  --  104*  --   --   --   --   ALT 536*  --  223*  --   --   --   --   ALKPHOS 79  --  94  --   --   --   --   BILITOT 0.8  --  0.7  --   --   --   --   PROT 6.4*  --  6.1*  --   --   --   --   ALBUMIN 2.7*  < > 2.7* 2.6* 2.7* 2.8* 2.6*  < > = values in this interval not displayed. No results for input(s): LIPASE, AMYLASE in the last 168 hours. No results for input(s): AMMONIA in the last 168 hours. Coagulation Profile: No results for input(s): INR, PROTIME in the last 168 hours. Cardiac Enzymes: No results for input(s): CKTOTAL, CKMB, CKMBINDEX, TROPONINI in the last 168 hours. BNP (last 3 results) No results for input(s): PROBNP in the last 8760 hours. HbA1C: No results for input(s): HGBA1C in the last 72 hours. CBG:  Recent Labs Lab 07/19/16 2334 07/20/16 0312 07/20/16 0837 07/20/16 1720 07/20/16 2152  GLUCAP 98 103* 100* 108* 99   Lipid Profile: No results for input(s): CHOL, HDL, LDLCALC, TRIG, CHOLHDL, LDLDIRECT in the last 72 hours. Thyroid Function Tests: No results for input(s): TSH, T4TOTAL, FREET4, T3FREE, THYROIDAB in the last 72 hours. Anemia Panel: No results for input(s): VITAMINB12, FOLATE, FERRITIN, TIBC, IRON, RETICCTPCT in the last 72 hours. Urine analysis: No results found for: COLORURINE, APPEARANCEUR, LABSPEC, PHURINE, GLUCOSEU, HGBUR, BILIRUBINUR, KETONESUR, PROTEINUR, UROBILINOGEN, NITRITE, LEUKOCYTESUR Sepsis Labs: @LABRCNTIP (procalcitonin:4,lacticidven:4)  )No results found for this or any previous visit (from the past 240 hour(s)).       Radiology Studies: Ir Fluoro Guide Cv Line Right  Result Date: 07/20/2016 INDICATION: Renal failure EXAM: TUNNELED DIALYSIS CATHETER PLACEMENT, ULTRASOUND GUIDANCE FOR VASCULAR ACCESS MEDICATIONS: Ancef; The antibiotic was  administered within an appropriate time interval prior to skin puncture. ANESTHESIA/SEDATION: Versed 4  mg IV; Fentanyl 100 mcg IV; Moderate Sedation Time:  20 The patient was continuously monitored during the procedure by the interventional radiology nurse under my direct supervision. FLUOROSCOPY TIME:  Fluoroscopy Time:  minutes 42 seconds (3 mGy). COMPLICATIONS: None immediate. PROCEDURE: Informed written consent was obtained from the patient after a thorough discussion of the procedural risks, benefits and alternatives. All questions were addressed. Maximal Sterile Barrier Technique was utilized including caps, mask, sterile gowns, sterile gloves, sterile drape, hand hygiene and skin antiseptic. A timeout was performed prior to the initiation of the procedure. The right neck was prepped with ChloraPrep in a sterile fashion, and a sterile drape was applied covering the operative field. A sterile gown and sterile gloves were used for the procedure. 1% lidocaine into the skin and subcutaneous tissue. The jugular vein was noted to be patent initially with ultrasound. Under sonographic guidance, a micropuncture needle was inserted into the right IJ vein (Ultrasound and fluoroscopic image documentation was performed). It was removed over an 018 wire which was up-sized to an Amplatz. This was advanced into the IVC. A small incision was made in the right upper chest. The tunneling device was utilized to advance the 23 centimeter tip to cuff catheter from the chest incision and out the neck incision. A peel-away sheath was advanced over the Amplatz wire. The leading edge of the catheter was then advanced through the peel-away sheath. The peel-away sheath was removed. It was flushed and instilled with heparin. The chest incision was closed with a 0 Prolene pursestring stitch. The neck incision was closed with a 4-0 Vicryl subcuticular stitch. IMPRESSION: Successful right IJ vein tunneled dialysis catheter with its tip in the right atrium. Electronically Signed   By: Jolaine ClickArthur  Hoss M.D.   On: 07/20/2016 15:17   Ir [place] Guide  Vasc Access Right  Result Date: 07/20/2016 INDICATION: Renal failure EXAM: TUNNELED DIALYSIS CATHETER PLACEMENT, ULTRASOUND GUIDANCE FOR VASCULAR ACCESS MEDICATIONS: Ancef; The antibiotic was administered within an appropriate time interval prior to skin puncture. ANESTHESIA/SEDATION: Versed 4 mg IV; Fentanyl 100 mcg IV; Moderate Sedation Time:  20 The patient was continuously monitored during the procedure by the interventional radiology nurse under my direct supervision. FLUOROSCOPY TIME:  Fluoroscopy Time:  minutes 42 seconds (3 mGy). COMPLICATIONS: None immediate. PROCEDURE: Informed written consent was obtained from the patient after a thorough discussion of the procedural risks, benefits and alternatives. All questions were addressed. Maximal Sterile Barrier Technique was utilized including caps, mask, sterile gowns, sterile gloves, sterile drape, hand hygiene and skin antiseptic. A timeout was performed prior to the initiation of the procedure. The right neck was prepped with ChloraPrep in a sterile fashion, and a sterile drape was applied covering the operative field. A sterile gown and sterile gloves were used for the procedure. 1% lidocaine into the skin and subcutaneous tissue. The jugular vein was noted to be patent initially with ultrasound. Under sonographic guidance, a micropuncture needle was inserted into the right IJ vein (Ultrasound and fluoroscopic image documentation was performed). It was removed over an 018 wire which was up-sized to an Amplatz. This was advanced into the IVC. A small incision was made in the right upper chest. The tunneling device was utilized to advance the 23 centimeter tip to cuff catheter from the chest incision and out the neck incision. A peel-away sheath was advanced over the Amplatz wire. The leading edge of the catheter was then  advanced through the peel-away sheath. The peel-away sheath was removed. It was flushed and instilled with heparin. The chest incision was  closed with a 0 Prolene pursestring stitch. The neck incision was closed with a 4-0 Vicryl subcuticular stitch. IMPRESSION: Successful right IJ vein tunneled dialysis catheter with its tip in the right atrium. Electronically Signed   By: Jolaine Click M.D.   On: 07/20/2016 15:17        Scheduled Meds: . Chlorhexidine Gluconate Cloth  6 each Topical Daily  . docusate sodium  100 mg Oral BID  . feeding supplement  1 Container Oral BID BM  . heparin  5,000 Units Subcutaneous Q8H  . mouth rinse  15 mL Mouth Rinse BID  . methadone  10 mg Oral Daily  . sevelamer carbonate  1,600 mg Oral TID WC  . sodium chloride flush  10-40 mL Intracatheter Q12H   Continuous Infusions: . sodium chloride    . sodium chloride       LOS: 12 days    Time spent: 71    Pleas Koch, MD [facility]   If 7PM-7AM, please contact night-coverage [email or web address] Password [email or web address] 07/21/2016, 1:53 PM

## 2016-07-21 NOTE — Progress Notes (Signed)
Flushed foley catheter with 60 cc Normal Saline per MD order. Flow of dark blood noted in tubing.

## 2016-07-21 NOTE — Progress Notes (Signed)
Patient's sister called and requesting for psyche MD to see patient for an evaluation. She believes that patient is just confused and not suicidal. Will update MD in am.

## 2016-07-21 NOTE — Progress Notes (Signed)
Patient informed that insulin pump has been discontinued.  Patient stated that pump is still infusing insulin but she would take it off when complete.  Patient stated her son is suppose to bring her supplies tonight for insulin pump. Informed patient that RN would need to call MD to reinstate insulin pump orders.

## 2016-07-21 NOTE — Progress Notes (Signed)
PT Cancellation Note  Patient Details Name: [name] MRN: [id number] DOB: [birth year]   Cancelled Treatment:    Reason Eval/Treat Not Completed: Patient at procedure or test/unavailable   Currently in HD;  Will follow up later today as time allows;  Otherwise, will follow up for PT tomorrow;   Thank you,  Van ClinesHolly Severiano Utsey, PT  Acute Rehabilitation Services Pager [phone] Office [phone]     Levi AlandHolly H Juna Caban 07/21/2016, 8:45 AM

## 2016-07-21 NOTE — Progress Notes (Signed)
Long discussion clarifying need for safety sitter with mother, sister Dennis Bast[who is a nurse] Floor policy has been explained clearly to them by Publishing copynursing director.  Pleas KochJai Mohid Furuya, MD [facility] [phone](P) [phone]

## 2016-07-21 NOTE — Progress Notes (Signed)
Patients family requested to take the patients belongings to the car. Patient has 2 bags of belongings that were kept in the back of the Nurses station. This RN got permission from the patient that it was okay for the family to take the belongings. Permission granted. Family aware. Belongings given to the family.  Avelina LaineKimberly Sirenity Shew RN

## 2016-07-21 NOTE — Progress Notes (Signed)
Patient arrived back on unit from hemodialysis. Moderate amount of dark red blood pooled under penis.  Blood coming from penis as coude catheter is partially pulled out.  Patient complain of pain at catheter site.  Dr. Mahala MenghiniSamtani notified.

## 2016-07-21 NOTE — Progress Notes (Signed)
Family had given patient his cell phone. Patient's cell phone given to mom at nurses station (Ryanne Young witness).

## 2016-07-21 NOTE — Progress Notes (Signed)
Patient's mother and sister were in patient's room with personal items and handbags. Both mother and sister were asked to come up front to be wanded by security prior to visiting and also informed that no personal items, including hand bags, can be permitted into patient's room.  Family upset stating that the patient did not try to harm himself and he should not be on suicide precautions. Dr. Mahala MenghiniSamtani notified.  Dr. Mahala MenghiniSamtani called and spoke with both mother and sister via telephone.

## 2016-07-21 NOTE — Progress Notes (Signed)
Subjective:  No acute events overnight.  He still continues to require a Comptroller.  He received a new TDC yesterday with IR.    Objective: Vital signs in last 24 hours: Temp:  [97.6 F (36.4 C)-98.5 F (36.9 C)] 98 F (36.7 C) (06/19 0841) Pulse Rate:  [85-102] 94 (06/19 1230) Resp:  [13-23] 18 (06/19 0841) BP: (117-149)/(63-92) 122/65 (06/19 1230) SpO2:  [93 %-97 %] 96 % (06/19 0841) Weight:  [106.8 kg (235 lb 7.2 oz)] 106.8 kg (235 lb 7.2 oz) (06/19 0841) Weight change:   Intake/Output from previous day: 06/18 0701 - 06/19 0700 In: 237 [P.O.:237] Out: -  Intake/Output this shift: Total I/O In: 120 [P.O.:120] Out: -   GEN sleeping, pale NECK no JVD PULM clear anteriorly CV III/VI systolic murmur LLSB ABD mildly distended EXT 1+ LE edema  Lab Results:  Recent Labs  07/20/16 0250 07/21/16 0514  WBC 12.1* 10.6*  HGB 9.9* 8.7*  HCT 30.3* 25.9*  PLT 332 368   BMET:   Recent Labs  07/20/16 0250 07/21/16 0514  NA 130*  130* 129*  K 5.4*  5.4* 6.0*  CL 92*  92* 91*  CO2 24  25 20*  GLUCOSE 97  97 100*  BUN 78*  78* 114*  CREATININE 9.52*  9.78* 12.55*  CALCIUM 9.1  9.0 8.6*   No results for input(s): PTH in the last 72 hours. Iron Studies: No results for input(s): IRON, TIBC, TRANSFERRIN, FERRITIN in the last 72 hours.  Studies/Results: Ir Fluoro Guide Cv Line Right  Result Date: 07/20/2016 INDICATION: Renal failure EXAM: TUNNELED DIALYSIS CATHETER PLACEMENT, ULTRASOUND GUIDANCE FOR VASCULAR ACCESS MEDICATIONS: Ancef; The antibiotic was administered within an appropriate time interval prior to skin puncture. ANESTHESIA/SEDATION: Versed 4 mg IV; Fentanyl 100 mcg IV; Moderate Sedation Time:  20 The patient was continuously monitored during the procedure by the interventional radiology nurse under my direct supervision. FLUOROSCOPY TIME:  Fluoroscopy Time:  minutes 42 seconds (3 mGy). COMPLICATIONS: None immediate. PROCEDURE: Informed written consent was  obtained from the patient after a thorough discussion of the procedural risks, benefits and alternatives. All questions were addressed. Maximal Sterile Barrier Technique was utilized including caps, mask, sterile gowns, sterile gloves, sterile drape, hand hygiene and skin antiseptic. A timeout was performed prior to the initiation of the procedure. The right neck was prepped with ChloraPrep in a sterile fashion, and a sterile drape was applied covering the operative field. A sterile gown and sterile gloves were used for the procedure. 1% lidocaine into the skin and subcutaneous tissue. The jugular vein was noted to be patent initially with ultrasound. Under sonographic guidance, a micropuncture needle was inserted into the right IJ vein (Ultrasound and fluoroscopic image documentation was performed). It was removed over an 018 wire which was up-sized to an Amplatz. This was advanced into the IVC. A small incision was made in the right upper chest. The tunneling device was utilized to advance the 23 centimeter tip to cuff catheter from the chest incision and out the neck incision. A peel-away sheath was advanced over the Amplatz wire. The leading edge of the catheter was then advanced through the peel-away sheath. The peel-away sheath was removed. It was flushed and instilled with heparin. The chest incision was closed with a 0 Prolene pursestring stitch. The neck incision was closed with a 4-0 Vicryl subcuticular stitch. IMPRESSION: Successful right IJ vein tunneled dialysis catheter with its tip in the right atrium. Electronically Signed   By: Jolaine Click  M.D.   On: 07/20/2016 15:17   Ir [place] Guide Vasc Access Right  Result Date: 07/20/2016 INDICATION: Renal failure EXAM: TUNNELED DIALYSIS CATHETER PLACEMENT, ULTRASOUND GUIDANCE FOR VASCULAR ACCESS MEDICATIONS: Ancef; The antibiotic was administered within an appropriate time interval prior to skin puncture. ANESTHESIA/SEDATION: Versed 4 mg IV; Fentanyl 100 mcg  IV; Moderate Sedation Time:  20 The patient was continuously monitored during the procedure by the interventional radiology nurse under my direct supervision. FLUOROSCOPY TIME:  Fluoroscopy Time:  minutes 42 seconds (3 mGy). COMPLICATIONS: None immediate. PROCEDURE: Informed written consent was obtained from the patient after a thorough discussion of the procedural risks, benefits and alternatives. All questions were addressed. Maximal Sterile Barrier Technique was utilized including caps, mask, sterile gowns, sterile gloves, sterile drape, hand hygiene and skin antiseptic. A timeout was performed prior to the initiation of the procedure. The right neck was prepped with ChloraPrep in a sterile fashion, and a sterile drape was applied covering the operative field. A sterile gown and sterile gloves were used for the procedure. 1% lidocaine into the skin and subcutaneous tissue. The jugular vein was noted to be patent initially with ultrasound. Under sonographic guidance, a micropuncture needle was inserted into the right IJ vein (Ultrasound and fluoroscopic image documentation was performed). It was removed over an 018 wire which was up-sized to an Amplatz. This was advanced into the IVC. A small incision was made in the right upper chest. The tunneling device was utilized to advance the 23 centimeter tip to cuff catheter from the chest incision and out the neck incision. A peel-away sheath was advanced over the Amplatz wire. The leading edge of the catheter was then advanced through the peel-away sheath. The peel-away sheath was removed. It was flushed and instilled with heparin. The chest incision was closed with a 0 Prolene pursestring stitch. The neck incision was closed with a 4-0 Vicryl subcuticular stitch. IMPRESSION: Successful right IJ vein tunneled dialysis catheter with its tip in the right atrium. Electronically Signed   By: Jolaine ClickArthur  Hoss M.D.   On: 07/20/2016 15:17    I have reviewed the patient's  current medications.  Assessment/Plan: 1 Acute oliguric kidney injury: secondary to rhabdo.  He previously required CRRT has no signs of renal recovery yet.  He has a TDC now.  We will continue TTS schedule; 2K bath.     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  Urethral trauma: needs a Foley catheter for 4 weeks- he self-discontinued Foley on 6/11, now has a 3-way Foley in place which is NOT to be removed.  4.  Bone/ mineral: phos in the 12s, renal diet, fluid restriction, will start renvela 1600 TID with meals (will likely need more) and will d/c Phoslo  5.  Anemia: iron panel ordered   LOS: 12 days   Elfego Giammarino 07/21/2016,12:37 PM

## 2016-07-21 NOTE — Progress Notes (Signed)
Per Alcario DroughtErica with [employer] continuous bladder irrigation on back order should be arriving today.  Alcario Droughtrica will call as soon as they arrive.  Dr. Mahala MenghiniSamtani updated.

## 2016-07-21 NOTE — Progress Notes (Signed)
Message left with reception at [facility] for on call  MD to call ASAP.

## 2016-07-21 NOTE — Progress Notes (Signed)
Subjective/Chief Complaint:  1 - Urethral Injury - catheter trauma resulting in distal urethral / meatual injury presntly being managed with healing over large bore catheter (78F) last placed 07/13/16 over wire.  2 - Gross Hematuria - blood at meatus following heparanization at dialysis 6/19. This resolved after few hours.   3 - Acute Renal Failure - due to rhabdo / drug overdose, now on dialysis per nephrology.  Today "Blake Kramer" is seen for eval of new gross hematuria after heparnization / dialysis today.    Objective: Vital signs in last 24 hours: Temp:  [97.6 F (36.4 C)-98.5 F (36.9 C)] 98 F (36.7 C) (06/19 0841) Pulse Rate:  [85-102] 94 (06/19 1230) Resp:  [16-21] 18 (06/19 0841) BP: (117-149)/(63-78) 122/65 (06/19 1230) SpO2:  [93 %-96 %] 96 % (06/19 0841) Weight:  [106.8 kg (235 lb 7.2 oz)] 106.8 kg (235 lb 7.2 oz) (06/19 0841) Last BM Date: 07/10/16  Intake/Output from previous day: 06/18 0701 - 06/19 0700 In: 237 [P.O.:237] Out: -  Intake/Output this shift: Total I/O In: 420 [P.O.:360; Other:60] Out: -   General appearance: alert and sitter at bedside Ears: external normal Nose: Nares normal. Septum midline. Mucosa normal. No drainage or sinus tenderness. Throat: lips, mucosa, and tongue normal; teeth and gums normal Neck: supple, symmetrical, trachea midline Back: symmetric, no curvature. ROM normal. No CVA tenderness. Resp: non-labored on room air.  Cardio: Nl rate GI: soft, non-tender; bowel sounds normal; no masses,  no organomegaly Male genitalia: 1 way foley in place wtih dark dried blood at meatus, no active bleeding. Tea colored urine in tubing w/o clots.  Extremities: extremities normal, atraumatic, no cyanosis or edema Pulses: 2+ and symmetric Skin: Skin color, texture, turgor normal. No rashes or lesions Lymph nodes: Cervical, supraclavicular, and axillary nodes normal. Neurologic: Mental status: AO x1. Blunted affect.   Lab Results:    Recent Labs  07/21/16 0514 07/21/16 1419  WBC 10.6* 11.1*  HGB 8.7* 9.2*  HCT 25.9* 27.8*  PLT 368 360   BMET  Recent Labs  07/20/16 0250 07/21/16 0514  NA 130*  130* 129*  K 5.4*  5.4* 6.0*  CL 92*  92* 91*  CO2 24  25 20*  GLUCOSE 97  97 100*  BUN 78*  78* 114*  CREATININE 9.52*  9.78* 12.55*  CALCIUM 9.1  9.0 8.6*   PT/INR No results for input(s): LABPROT, INR in the last 72 hours. ABG No results for input(s): PHART, HCO3 in the last 72 hours.  Invalid input(s): PCO2, PO2  Studies/Results: Ir Fluoro Guide Cv Line Right  Result Date: 07/20/2016 INDICATION: Renal failure EXAM: TUNNELED DIALYSIS CATHETER PLACEMENT, ULTRASOUND GUIDANCE FOR VASCULAR ACCESS MEDICATIONS: Ancef; The antibiotic was administered within an appropriate time interval prior to skin puncture. ANESTHESIA/SEDATION: Versed 4 mg IV; Fentanyl 100 mcg IV; Moderate Sedation Time:  20 The patient was continuously monitored during the procedure by the interventional radiology nurse under my direct supervision. FLUOROSCOPY TIME:  Fluoroscopy Time:  minutes 42 seconds (3 mGy). COMPLICATIONS: None immediate. PROCEDURE: Informed written consent was obtained from the patient after a thorough discussion of the procedural risks, benefits and alternatives. All questions were addressed. Maximal Sterile Barrier Technique was utilized including caps, mask, sterile gowns, sterile gloves, sterile drape, hand hygiene and skin antiseptic. A timeout was performed prior to the initiation of the procedure. The right neck was prepped with ChloraPrep in a sterile fashion, and a sterile drape was applied covering the operative field. A sterile gown and  sterile gloves were used for the procedure. 1% lidocaine into the skin and subcutaneous tissue. The jugular vein was noted to be patent initially with ultrasound. Under sonographic guidance, a micropuncture needle was inserted into the right IJ vein (Ultrasound and fluoroscopic  image documentation was performed). It was removed over an 018 wire which was up-sized to an Amplatz. This was advanced into the IVC. A small incision was made in the right upper chest. The tunneling device was utilized to advance the 23 centimeter tip to cuff catheter from the chest incision and out the neck incision. A peel-away sheath was advanced over the Amplatz wire. The leading edge of the catheter was then advanced through the peel-away sheath. The peel-away sheath was removed. It was flushed and instilled with heparin. The chest incision was closed with a 0 Prolene pursestring stitch. The neck incision was closed with a 4-0 Vicryl subcuticular stitch. IMPRESSION: Successful right IJ vein tunneled dialysis catheter with its tip in the right atrium. Electronically Signed   By: Jolaine ClickArthur  Hoss M.D.   On: 07/20/2016 15:17   Ir [place] Guide Vasc Access Right  Result Date: 07/20/2016 INDICATION: Renal failure EXAM: TUNNELED DIALYSIS CATHETER PLACEMENT, ULTRASOUND GUIDANCE FOR VASCULAR ACCESS MEDICATIONS: Ancef; The antibiotic was administered within an appropriate time interval prior to skin puncture. ANESTHESIA/SEDATION: Versed 4 mg IV; Fentanyl 100 mcg IV; Moderate Sedation Time:  20 The patient was continuously monitored during the procedure by the interventional radiology nurse under my direct supervision. FLUOROSCOPY TIME:  Fluoroscopy Time:  minutes 42 seconds (3 mGy). COMPLICATIONS: None immediate. PROCEDURE: Informed written consent was obtained from the patient after a thorough discussion of the procedural risks, benefits and alternatives. All questions were addressed. Maximal Sterile Barrier Technique was utilized including caps, mask, sterile gowns, sterile gloves, sterile drape, hand hygiene and skin antiseptic. A timeout was performed prior to the initiation of the procedure. The right neck was prepped with ChloraPrep in a sterile fashion, and a sterile drape was applied covering the operative field. A  sterile gown and sterile gloves were used for the procedure. 1% lidocaine into the skin and subcutaneous tissue. The jugular vein was noted to be patent initially with ultrasound. Under sonographic guidance, a micropuncture needle was inserted into the right IJ vein (Ultrasound and fluoroscopic image documentation was performed). It was removed over an 018 wire which was up-sized to an Amplatz. This was advanced into the IVC. A small incision was made in the right upper chest. The tunneling device was utilized to advance the 23 centimeter tip to cuff catheter from the chest incision and out the neck incision. A peel-away sheath was advanced over the Amplatz wire. The leading edge of the catheter was then advanced through the peel-away sheath. The peel-away sheath was removed. It was flushed and instilled with heparin. The chest incision was closed with a 0 Prolene pursestring stitch. The neck incision was closed with a 4-0 Vicryl subcuticular stitch. IMPRESSION: Successful right IJ vein tunneled dialysis catheter with its tip in the right atrium. Electronically Signed   By: Jolaine ClickArthur  Hoss M.D.   On: 07/20/2016 15:17    Anti-infectives: Anti-infectives    Start     Dose/Rate Route Frequency Ordered Stop   07/20/16 1432  vancomycin (VANCOCIN) 1-5 GM/200ML-% IVPB    Comments:  Georgette ShellSpencer, Sara   : cabinet override      07/20/16 1432 07/20/16 1553   07/20/16 1300  vancomycin (VANCOCIN) IVPB 1000 mg/200 mL premix     1,000 mg  200 mL/hr over 60 Minutes Intravenous To ShortStay Surgical 07/20/16 1256 07/20/16 1540   07/10/16 1200  cefTRIAXone (ROCEPHIN) 1 g in dextrose 5 % 50 mL IVPB     1 g 100 mL/hr over 30 Minutes Intravenous Every 24 hours 07/10/16 1136 07/16/16 1240     BEDSIDE IRRIGATION:  Using aseptic technique sterile water irrigated in 60cc aliquots to clear. No clots in bladder or signs of active bleeding.   Assessment/Plan:  1 - Urethral Injury - catheter needs to remain in situ for  healing of this and for hematuria tamponade.   2 - Gross Hematuria - likely from known urethral injury in setting of heparinization. Catheter in place and flushes well. Active bleeding has stopped and is clearly from distal urethral injury. Irrigate PRN by hand. Does NOT need continuous irrigation as bleeding source is distal urethra and not bladder.   3 - Acute Renal Failure - per nephrology   [facility], Blake Kramer 07/21/2016

## 2016-07-22 DIAGNOSIS — T1491XA Suicide attempt, initial encounter: Secondary | ICD-10-CM

## 2016-07-22 DIAGNOSIS — T401X1A Poisoning by heroin, accidental (unintentional), initial encounter: Principal | ICD-10-CM

## 2016-07-22 DIAGNOSIS — T50901A Poisoning by unspecified drugs, medicaments and biological substances, accidental (unintentional), initial encounter: Secondary | ICD-10-CM

## 2016-07-22 DIAGNOSIS — F17210 Nicotine dependence, cigarettes, uncomplicated: Secondary | ICD-10-CM

## 2016-07-22 DIAGNOSIS — F11221 Opioid dependence with intoxication delirium: Secondary | ICD-10-CM

## 2016-07-22 LAB — RENAL FUNCTION PANEL
ALBUMIN: 2.6 g/dL — AB (ref 3.5–5.0)
Anion gap: 14 (ref 5–15)
BUN: 70 mg/dL — ABNORMAL HIGH (ref 6–20)
CALCIUM: 8.7 mg/dL — AB (ref 8.9–10.3)
CO2: 25 mmol/L (ref 22–32)
CREATININE: 9.31 mg/dL — AB (ref 0.61–1.24)
Chloride: 93 mmol/L — ABNORMAL LOW (ref 101–111)
GFR calc Af Amer: 8 mL/min — ABNORMAL LOW (ref >60)
GFR calc non Af Amer: 7 mL/min — ABNORMAL LOW (ref >60)
GLUCOSE: 107 mg/dL — AB (ref 65–99)
Phosphorus: 10.7 mg/dL — ABNORMAL HIGH (ref 2.5–4.6)
Potassium: 5.6 mmol/L — ABNORMAL HIGH (ref 3.5–5.1)
SODIUM: 132 mmol/L — AB (ref 135–145)

## 2016-07-22 LAB — IRON AND TIBC
IRON: 36 ug/dL — AB (ref 45–182)
Saturation Ratios: 11 % — ABNORMAL LOW (ref 17.9–39.5)
TIBC: 318 ug/dL (ref 250–450)
UIBC: 282 ug/dL

## 2016-07-22 LAB — CBC
HCT: 25.3 % — ABNORMAL LOW (ref 39.0–52.0)
Hemoglobin: 8.2 g/dL — ABNORMAL LOW (ref 13.0–17.0)
MCH: 28.7 pg (ref 26.0–34.0)
MCHC: 32.4 g/dL (ref 30.0–36.0)
MCV: 88.5 fL (ref 78.0–100.0)
PLATELETS: 391 10*3/uL (ref 150–400)
RBC: 2.86 MIL/uL — ABNORMAL LOW (ref 4.22–5.81)
RDW: 15 % (ref 11.5–15.5)
WBC: 9.6 10*3/uL (ref 4.0–10.5)

## 2016-07-22 LAB — FERRITIN: Ferritin: 190 ng/mL (ref 24–336)

## 2016-07-22 MED ORDER — SODIUM CHLORIDE 0.9 % IV SOLN
125.0000 mg | INTRAVENOUS | Status: DC
  Administered 2016-07-23 – 2016-07-25 (×2): 125 mg via INTRAVENOUS
  Filled 2016-07-22 (×5): qty 10

## 2016-07-22 MED ORDER — DARBEPOETIN ALFA 150 MCG/0.3ML IJ SOSY
150.0000 ug | PREFILLED_SYRINGE | INTRAMUSCULAR | Status: DC
  Administered 2016-07-23: 150 ug via INTRAVENOUS
  Filled 2016-07-22: qty 0.3

## 2016-07-22 NOTE — Progress Notes (Signed)
Subjective:  No acute events overnight.  Sleeping today.  Doesn't engage with this Clinical research associatewriter very much.  Objective: Vital signs in last 24 hours: Temp:  [98.5 F (36.9 C)-98.8 F (37.1 C)] 98.6 F (37 C) (06/20 0444) Pulse Rate:  [82-93] 82 (06/20 0444) Resp:  [18] 18 (06/20 0444) BP: (116-133)/(62-72) 128/62 (06/20 0444) SpO2:  [93 %-95 %] 95 % (06/20 0444) Weight change:   Intake/Output from previous day: 06/19 0701 - 06/20 0700 In: 900 [P.O.:840] Out: 3075 [Urine:75] Intake/Output this shift: Total I/O In: 300 [P.O.:240; Other:60] Out: 90 [Urine:90]  GEN sleeping, pale, looks annoyed with being woken up NECK no JVD PULM clear anteriorly CV III/VI systolic murmur LLSB ABD mildly distended EXT 1+ LE edema  Lab Results:  Recent Labs  07/21/16 1419 07/22/16 0421  WBC 11.1* 9.6  HGB 9.2* 8.2*  HCT 27.8* 25.3*  PLT 360 391   BMET:   Recent Labs  07/21/16 0514 07/22/16 0421  NA 129* 132*  K 6.0* 5.6*  CL 91* 93*  CO2 20* 25  GLUCOSE 100* 107*  BUN 114* 70*  CREATININE 12.55* 9.31*  CALCIUM 8.6* 8.7*   No results for input(s): PTH in the last 72 hours. Iron Studies:   Recent Labs  07/22/16 0421  IRON 36*  TIBC 318  FERRITIN 190    Studies/Results: No results found.  I have reviewed the patient's current medications.  Assessment/Plan: 1 Acute oliguric kidney injury: secondary to rhabdo.  He previously required CRRT has no signs of renal recovery yet.  He has a TDC now.  We will continue TTS schedule; 2K bath.     2 Drug OD: unintentional- psych has seen, will need residential substance abuse treatment.  3.  Urethral trauma: needs a Foley catheter for 4 weeks- he self-discontinued Foley on 6/11, now has a 3-way Foley in place which is NOT to be removed.  4.  Bone/ mineral: phos in the 12s, renal diet, fluid restriction, will start renvela 1600 TID with meals (will likely need more) and will d/c Phoslo  5.  Anemia: iron panel % sat 11, will  order ferlicet with HD and Aranesp too   LOS: 13 days   Blake Kramer 07/22/2016,3:35 PM

## 2016-07-22 NOTE — Progress Notes (Signed)
Patient has had little urine output today from foley catheter; 130 mL. Patient refusing to have foley catheter irrigated & refusing bladder scan at this time.

## 2016-07-22 NOTE — Progress Notes (Signed)
PROGRESS NOTE    Blake Kramer  WUJ:[id number] DOB: 1995-07-28 DOA: 07/09/2016 PCP: No primary care provider on file.  Outpatient Specialists:     Brief Narrative:  21 ? Allegedly drug user and dealer [see psych Md note] Admit by PCCM from [facility] with OD, Fentanyl/morphine Intubated for airway protxn Narcan unrespnsive Lactate 10.4, SCr 4.4, K 7.1, AST 1453, ALT 1008, AG 23, WBC 18.9, Hgb 17.8, Trop 4.48.  UDS positive for opiates    Assessment & Plan:   Principal Problem:   Drug overdose Active Problems:   AKI (acute kidney injury) (HCC)   Acute respiratory failure with hypoxia (HCC)   Urethral bleeding   Illicit Drug overdose - intent unclear  Pt now more alert and lucid Psychiatry has seen and cleared him from suicidality perspective  Acute resp failure with hypoxia Resolved -due to OD   Acute renal failure on HD Due to drug overdose > rhabdomyolysis - tunneled HD cath placed 6/18 Nephrology attending to HD   Rhabdomyolysis resolved  Urethral trauma  Repeat urethral trauma 6/19 probably secondary to heparinization in dialysis Patient self DC'd Foley  At dilaysis on 6/19 expeirenced pain and found to be in pool of blood-this seems to have ceased flsuhed with 60 cc saline and got back dark blood Appreciate Dr. Berneice Heinrich input--no further urological urgent needs  -plan for at least 4 wksper urology  - do NOTremove Foley catheter  Shock Liver / Transaminitis Due to above - resolved   Acute blood loss due to urethral trauma See above Hemoglobin currently 8.2 Transfusion threshold below 8  iron studies 6/21 showed iron of 36 and saturation of 11 Defer use of EPO/IV iron to nephro  Acute encephalopathy  due to intentionaldrug overdose + sedation(Heroin and Fentanyl) - methadone 10 mg daily Have discontinued IV morphine Have discontinued psychotropics Patient cleared from this perspective of from psychiatry and now await medical disposition in  terms of renal insufficiency     Subjective: Claims in pain Seems comfortable laying in bed Events of bleeding noted He is currently having meatus cleaned  Objective: Vitals:   07/21/16 1256 07/21/16 1641 07/21/16 2100 07/22/16 0444  BP: (!) 142/75 133/72 116/63 128/62  Pulse: 98 86 93 82  Resp: 18 18 18 18   Temp: 98.3 F (36.8 C) 98.5 F (36.9 C) 98.8 F (37.1 C) 98.6 F (37 C)  TempSrc: Oral Oral Oral Oral  SpO2: 98% 95% 93% 95%  Weight: 103.8 kg (228 lb 13.4 oz)     Height:        Intake/Output Summary (Last 24 hours) at 07/22/16 1554 Last data filed at 07/22/16 0914  Gross per 24 hour  Intake              780 ml  Output              165 ml  Net              615 ml   Filed Weights   07/20/16 0500 07/21/16 0841 07/21/16 1256  Weight: 103.5 kg (228 lb 2.8 oz) 106.8 kg (235 lb 7.2 oz) 103.8 kg (228 lb 13.4 oz)    Examination:  Obese pleaant in No distress Chest clear Abdomen soft Foley catheter has blood surrounding it, no new gross blood Neuro intact    Data Reviewed: I have personally reviewed following labs and imaging studies  CBC:  Recent Labs Lab 07/19/16 0233 07/20/16 0250 07/21/16 0514 07/21/16 1419 07/22/16 0421  WBC 15.1* 12.1*  10.6* 11.1* 9.6  NEUTROABS  --   --   --  9.0*  --   HGB 10.2* 9.9* 8.7* 9.2* 8.2*  HCT 31.1* 30.3* 25.9* 27.8* 25.3*  MCV 89.4 88.9 87.5 87.1 88.5  PLT 276 332 368 360 391   Basic Metabolic Panel:  Recent Labs Lab 07/16/16 0357  07/18/16 0415 07/19/16 0233 07/20/16 0250 07/21/16 0514 07/22/16 0421  NA  --   < > 132* 131* 130*  130* 129* 132*  K  --   < > 5.6* 4.8 5.4*  5.4* 6.0* 5.6*  CL  --   < > 97* 94* 92*  92* 91* 93*  CO2  --   < > 25 26 24  25  20* 25  GLUCOSE  --   < > 101* 111* 97  97 100* 107*  BUN  --   < > 75* 43* 78*  78* 114* 70*  CREATININE  --   < > 7.96* 6.27* 9.52*  9.78* 12.55* 9.31*  CALCIUM  --   < > 8.8* 8.8* 9.1  9.0 8.6* 8.7*  MG 2.7*  --   --   --  2.6*  --   --     PHOS  --   < > 7.8* 7.8* 10.7* 12.1* 10.7*  < > = values in this interval not displayed. GFR: Estimated Creatinine Clearance: 15.8 mL/min (A) (by C-G formula based on SCr of 9.31 mg/dL (H)). Liver Function Tests:  Recent Labs Lab 07/17/16 0400 07/18/16 0415 07/19/16 0233 07/20/16 0250 07/21/16 0514 07/22/16 0421  AST 104*  --   --   --   --   --   ALT 223*  --   --   --   --   --   ALKPHOS 94  --   --   --   --   --   BILITOT 0.7  --   --   --   --   --   PROT 6.1*  --   --   --   --   --   ALBUMIN 2.7* 2.6* 2.7* 2.8* 2.6* 2.6*   No results for input(s): LIPASE, AMYLASE in the last 168 hours. No results for input(s): AMMONIA in the last 168 hours. Coagulation Profile: No results for input(s): INR, PROTIME in the last 168 hours. Cardiac Enzymes: No results for input(s): CKTOTAL, CKMB, CKMBINDEX, TROPONINI in the last 168 hours. BNP (last 3 results) No results for input(s): PROBNP in the last 8760 hours. HbA1C: No results for input(s): HGBA1C in the last 72 hours. CBG:  Recent Labs Lab 07/19/16 2334 07/20/16 0312 07/20/16 0837 07/20/16 1720 07/20/16 2152  GLUCAP 98 103* 100* 108* 99   Lipid Profile: No results for input(s): CHOL, HDL, LDLCALC, TRIG, CHOLHDL, LDLDIRECT in the last 72 hours. Thyroid Function Tests: No results for input(s): TSH, T4TOTAL, FREET4, T3FREE, THYROIDAB in the last 72 hours. Anemia Panel:  Recent Labs  07/22/16 0421  FERRITIN 190  TIBC 318  IRON 36*   Urine analysis: No results found for: COLORURINE, APPEARANCEUR, LABSPEC, PHURINE, GLUCOSEU, HGBUR, BILIRUBINUR, KETONESUR, PROTEINUR, UROBILINOGEN, NITRITE, LEUKOCYTESUR Sepsis Labs: @LABRCNTIP (procalcitonin:4,lacticidven:4)  )No results found for this or any previous visit (from the past 240 hour(s)).       Radiology Studies: No results found.      Scheduled Meds: . docusate sodium  100 mg Oral BID  . feeding supplement  1 Container Oral BID BM  . heparin  5,000 Units  Subcutaneous Q8H  .  methadone  10 mg Oral Daily  . sevelamer carbonate  1,600 mg Oral TID WC  . sodium chloride flush  10-40 mL Intracatheter Q12H   Continuous Infusions: . sodium chloride    . sodium chloride       LOS: 13 days    Time spent: 7935    Pleas KochJai Peng Thorstenson, MD [facility](P) [phone]   If 7PM-7AM, please contact night-coverage [email or web address] Password [email or web address] 07/22/2016, 3:54 PM

## 2016-07-22 NOTE — Progress Notes (Signed)
Physical Therapy Treatment Patient Details Name: Blake Kramer MRN: [id number] DOB: [birth year] Today's Date: 07/22/2016    History of Present Illness Pt adm with drug overdose. UDS positive for opiates. Pt intubated 6/7 and self extubated 6/11. Pt with AKI and required CRRT which was stopped 6/14. To start HD on 6/16.  Pt pulled out foley with significant trauma. Also pulled out HD cath. No PMH.(per chart 10+ overdoses)    PT Comments    Continuing work on functional mobility and activity tolerance;  Good progress with activity tolerance, and Clemens was able to walk hallways this morning; RW proved useful to keep the foley bag stationary relative to [place] while walking, keeping him a bit calmer; He is young, and I anticipate good functional progress as he improves medically;   Would like to find out more about available assist in the home for [facility]; Our original recommendation is for CIR, and while at this point it is still appropriate, he may progress well enough to not need intensive therapies; will watch for progress; Noted also Psych eval pending (perhaps need for Behavioral Health?  Will consider bringing mesh briefs and pad next session in case there is bleeding/dripping form penis during amb -- still, I'm not sure that he will tolerate    Follow Up Recommendations  Other (comment)     Equipment Recommendations  Rolling walker with 5" wheels    Recommendations for Other Services       Precautions / Restrictions Precautions Precautions: Fall Precaution Comments: Foley due to significant urethral trauma    Mobility  Bed Mobility Overal bed mobility: Needs Assistance Bed Mobility: Supine to Sit     Supine to sit: Min guard (without physical contact)     General bed mobility comments: Increased time and kept both hip quite abducted due to pain in penis  Transfers Overall transfer level: Needs assistance Equipment used: None Transfers: Sit to/from Stand Sit to Stand:  Min guard         General transfer comment: Stood with bil hips abducted, posterior lean noted, likely to minimize hip movement because of anxiety at anticipation of pain  Ambulation/Gait Ambulation/Gait assistance: Min guard Ambulation Distance (Feet): 80 Feet Assistive device: None;Rolling walker (2 wheeled) Gait Pattern/deviations: Decreased step length - right;Decreased step length - left;Wide base of support     General Gait Details: walked without RW initially; overall steady; slow, short and wide steps; opted to use RW for pt's increased confidence with keeping foley bag close; sized Rw for optimal fit   Stairs            Wheelchair Mobility    Modified Rankin (Stroke Patients Only)       Balance     Sitting balance-Leahy Scale: Fair       Standing balance-Leahy Scale: Fair                              Cognition Arousal/Alertness: Awake/alert Behavior During Therapy: WFL for tasks assessed/performed Overall Cognitive Status: Within Functional Limits for tasks assessed (for simple mobility)                                 General Comments: More nervous today than agitated or irritated      Exercises      General Comments General comments (skin integrity, edema, etc.): Pt coughed during walk, and then had  small drips of blood from penis; turned around and headed back to room      Pertinent Vitals/Pain Pain Assessment: 0-10 Pain Score: 3  Pain Location: Penis, he is quite sensitive to any moving of foley bag Pain Descriptors / Indicators: Grimacing;Guarding Pain Intervention(s): Monitored during session    Home Living                      Prior Function            PT Goals (current goals can now be found in the care plan section) Acute Rehab PT Goals Patient Stated Goal: to eat PT Goal Formulation: Patient unable to participate in goal setting Time For Goal Achievement: 07/31/16 Potential to Achieve  Goals: Good Progress towards PT goals: Progressing toward goals    Frequency    Min 3X/week      PT Plan Current plan remains appropriate    Co-evaluation              AM-PAC PT "6 Clicks" Daily Activity  Outcome Measure  Difficulty turning over in bed (including adjusting bedclothes, sheets and blankets)?: None Difficulty moving from lying on back to sitting on the side of the bed? : A Little Difficulty sitting down on and standing up from a chair with arms (e.g., wheelchair, bedside commode, etc,.)?: A Little Help needed moving to and from a bed to chair (including a wheelchair)?: A Little Help needed walking in hospital room?: A Little Help needed climbing 3-5 steps with a railing? : A Little 6 Click Score: 19    End of Session Equipment Utilized During Treatment: Gait belt Activity Tolerance: Patient tolerated treatment well Patient left: in bed;with call bell/phone within reach;with nursing/sitter in room Nurse Communication: Mobility status PT Visit Diagnosis: Difficulty in walking, not elsewhere classified (R26.2);Pain Pain - part of body:  (Penis)     Time: [id number] PT Time Calculation (min) (ACUTE ONLY): 27 min  Charges:  $Gait Training: 8-22 mins $Therapeutic Activity: 8-22 mins                    G Codes:       Van ClinesHolly Arizbeth Cawthorn, PT  Acute Rehabilitation Services Pager [phone] Office [phone]    Levi AlandHolly H Layonna Dobie 07/22/2016, 9:22 AM

## 2016-07-22 NOTE — Progress Notes (Signed)
Flushed cath without problem. Bladder scanned zero urine.

## 2016-07-22 NOTE — Consult Note (Signed)
[place] Psychiatry Consult    Reason for Consult:  Unintentional drug overdose - heroin Referring Physician:  Dr. Verlon Au Patient Identification: Blake Kramer MRN:  [id number] Principal Diagnosis: Drug overdose Diagnosis:   Patient Active Problem List   Diagnosis Date Noted  . Urethral bleeding [N36.8]   . Acute respiratory failure with hypoxia ([place]) [J96.01]   . Drug overdose [T50.901A] 07/09/2016  . AKI (acute kidney injury) ([place]) [N17.9]     Total Time spent with patient: 1 hour  Subjective:   Blake Kramer is a 21 y.o. male patient admitted with Altered mental status, acute renal failure secondary to unintentional overdose of heroin.Marland Kitchen  HPI:  Blake Kramer is a 21 y.o. man admitted to [facility] with altered mental status, acute renal failure and required intubation on arrival which he was extubated after a few days. Patient reported he has been involved with a drug of abuse since age 7 and his last use was day of admission to the hospital. Patient reportedly relapsed 3 weeks ago after he was sober for 2 months. Patient is also reportedly has a multiple contacts from drug dealers and also reportedly making about $800 a day by supplying to the other drug users. Patient reported that this time it was different because he got too much health problems and hoping to stay sober for long time but does not have a current residential treatment substance abuse program and his mother and insurance company has been working with him regarding 6 months rehabilitation program if possible for him. Patient repeatedly stated that he does not have intention to end his life and in drug overdoses and unintentional. Patient stated he could not hear well since admitted to the hospital and does not know her strong with his ears. Patient also receiving hemodialysis. Patient reported his mother and sister has been unhappy that he does have a suicide center. Patient mom is happy Lucrezia Europe she was informed that we are  removing [employer] crew as of today with patient consent and also restricting his personal phone privileges.  Past Psychiatric History: Patient reported he has at least 3 different residential substance abuse rehabilitation treatment, 2 of them at [facility] [place] and one of them is in [place]. Patient stated he was able to stay sober and away from 3 months to one year in the past.   Risk to Self: Is patient at risk for suicide?: NO Risk to Others:   Prior Inpatient Therapy:   Prior Outpatient Therapy:    Past Medical History:  Past Medical History:  Diagnosis Date  . Drug abuse, IV    Opioid  . Substance abuse    Alcohol and Heroine    Past Surgical History:  Procedure Laterality Date  . IR FLUORO GUIDE CV LINE RIGHT  07/20/2016  . IR US GUIDE VASC ACCESS RIGHT  07/20/2016   Family History: History reviewed. No pertinent family history. Family Psychiatric  History: Family history significant for substance abuse in his biological mother, and a cousin. Patient has been staying with his grandfather. Social History:  History  Alcohol Use  . Yes     History  Drug Use  . Types: Heroin, IV    Social History   Social History  . Marital status: Single    Spouse name: N/A  . Number of children: N/A  . Years of education: N/A   Social History Main Topics  . Smoking status: Current Every Day Smoker    Packs/day: 1.00  Years: 5.00    Types: Cigarettes  . Smokeless tobacco: Never Used  . Alcohol use Yes  . Drug use: Yes    Types: Heroin, IV  . Sexual activity: Yes    Partners: Female    Birth control/ protection: None   Other Topics Concern  . None   Social History Narrative   Patient is a current IV Heroin user.    Additional Social History:    Allergies:   Allergies  Allergen Reactions  . Penicillins     Labs:  Results for orders placed or performed during the hospital encounter of 07/09/16 (from the past 48 hour(s))  Glucose, capillary     Status:  Abnormal   Collection Time: 07/20/16  5:20 PM  Result Value Ref Range   Glucose-Capillary 108 (H) 65 - 99 mg/dL  Glucose, capillary     Status: None   Collection Time: 07/20/16  9:52 PM  Result Value Ref Range   Glucose-Capillary 99 65 - 99 mg/dL   Comment 1 Notify RN   Renal function panel     Status: Abnormal   Collection Time: 07/21/16  5:14 AM  Result Value Ref Range   Sodium 129 (L) 135 - 145 mmol/L   Potassium 6.0 (H) 3.5 - 5.1 mmol/L   Chloride 91 (L) 101 - 111 mmol/L   CO2 20 (L) 22 - 32 mmol/L   Glucose, Bld 100 (H) 65 - 99 mg/dL   BUN 114 (H) 6 - 20 mg/dL   Creatinine, Ser 12.55 (H) 0.61 - 1.24 mg/dL   Calcium 8.6 (L) 8.9 - 10.3 mg/dL   Phosphorus 12.1 (H) 2.5 - 4.6 mg/dL    Comment: RESULTS CONFIRMED BY MANUAL DILUTION   Albumin 2.6 (L) 3.5 - 5.0 g/dL   GFR calc non Af Amer 5 (L) >60 mL/min   GFR calc Af Amer 6 (L) >60 mL/min    Comment: (NOTE) The eGFR has been calculated using the CKD EPI equation. This calculation has not been validated in all clinical situations. eGFR's persistently <60 mL/min signify possible Chronic Kidney Disease.    Anion gap 18 (H) 5 - 15  CBC     Status: Abnormal   Collection Time: 07/21/16  5:14 AM  Result Value Ref Range   WBC 10.6 (H) 4.0 - 10.5 K/uL   RBC 2.96 (L) 4.22 - 5.81 MIL/uL   Hemoglobin 8.7 (L) 13.0 - 17.0 g/dL   HCT 25.9 (L) 39.0 - 52.0 %   MCV 87.5 78.0 - 100.0 fL   MCH 29.4 26.0 - 34.0 pg   MCHC 33.6 30.0 - 36.0 g/dL   RDW 14.6 11.5 - 15.5 %   Platelets 368 150 - 400 K/uL  CBC with Differential/Platelet     Status: Abnormal   Collection Time: 07/21/16  2:19 PM  Result Value Ref Range   WBC 11.1 (H) 4.0 - 10.5 K/uL   RBC 3.19 (L) 4.22 - 5.81 MIL/uL   Hemoglobin 9.2 (L) 13.0 - 17.0 g/dL   HCT 27.8 (L) 39.0 - 52.0 %   MCV 87.1 78.0 - 100.0 fL   MCH 28.8 26.0 - 34.0 pg   MCHC 33.1 30.0 - 36.0 g/dL   RDW 14.5 11.5 - 15.5 %   Platelets 360 150 - 400 K/uL   Neutrophils Relative % 81 %   Lymphocytes Relative 14 %    Monocytes Relative 3 %   Eosinophils Relative 2 %   Basophils Relative 0 %   Neutro Abs  9.0 (H) 1.7 - 7.7 K/uL   Lymphs Abs 1.6 0.7 - 4.0 K/uL   Monocytes Absolute 0.3 0.1 - 1.0 K/uL   Eosinophils Absolute 0.2 0.0 - 0.7 K/uL   Basophils Absolute 0.0 0.0 - 0.1 K/uL  Renal function panel     Status: Abnormal   Collection Time: 07/22/16  4:21 AM  Result Value Ref Range   Sodium 132 (L) 135 - 145 mmol/L   Potassium 5.6 (H) 3.5 - 5.1 mmol/L   Chloride 93 (L) 101 - 111 mmol/L   CO2 25 22 - 32 mmol/L   Glucose, Bld 107 (H) 65 - 99 mg/dL   BUN 70 (H) 6 - 20 mg/dL   Creatinine, Ser 9.31 (H) 0.61 - 1.24 mg/dL   Calcium 8.7 (L) 8.9 - 10.3 mg/dL   Phosphorus 10.7 (H) 2.5 - 4.6 mg/dL   Albumin 2.6 (L) 3.5 - 5.0 g/dL   GFR calc non Af Amer 7 (L) >60 mL/min   GFR calc Af Amer 8 (L) >60 mL/min    Comment: (NOTE) The eGFR has been calculated using the CKD EPI equation. This calculation has not been validated in all clinical situations. eGFR's persistently <60 mL/min signify possible Chronic Kidney Disease.    Anion gap 14 5 - 15  CBC     Status: Abnormal   Collection Time: 07/22/16  4:21 AM  Result Value Ref Range   WBC 9.6 4.0 - 10.5 K/uL   RBC 2.86 (L) 4.22 - 5.81 MIL/uL   Hemoglobin 8.2 (L) 13.0 - 17.0 g/dL   HCT 25.3 (L) 39.0 - 52.0 %   MCV 88.5 78.0 - 100.0 fL   MCH 28.7 26.0 - 34.0 pg   MCHC 32.4 30.0 - 36.0 g/dL   RDW 15.0 11.5 - 15.5 %   Platelets 391 150 - 400 K/uL  Iron and TIBC     Status: Abnormal   Collection Time: 07/22/16  4:21 AM  Result Value Ref Range   Iron 36 (L) 45 - 182 ug/dL   TIBC 318 250 - 450 ug/dL   Saturation Ratios 11 (L) 17.9 - 39.5 %   UIBC 282 ug/dL  Ferritin     Status: None   Collection Time: 07/22/16  4:21 AM  Result Value Ref Range   Ferritin 190 24 - 336 ng/mL    Current Facility-Administered Medications  Medication Dose Route Frequency Provider Last Rate Last Dose  . 0.9 %  sodium chloride infusion  100 mL Intravenous PRN Deterding,  Jeneen Rinks, MD      . 0.9 %  sodium chloride infusion  100 mL Intravenous PRN Deterding, Jeneen Rinks, MD      . docusate sodium (COLACE) capsule 100 mg  100 mg Oral BID Rigoberto Noel, MD   100 mg at 07/22/16 0946  . feeding supplement (BOOST / RESOURCE BREEZE) liquid 1 Container  1 Container Oral BID BM Rigoberto Noel, MD   1 Container at 07/22/16 0946  . heparin injection 5,000 Units  5,000 Units Subcutaneous Q8H Saverio Danker, PA-C      . lidocaine (PF) (XYLOCAINE) 1 % injection 5 mL  5 mL Intradermal PRN Deterding, Jeneen Rinks, MD      . lidocaine-prilocaine (EMLA) cream 1 application  1 application Topical PRN Deterding, Jeneen Rinks, MD      . LORazepam (ATIVAN) injection 1 mg  1 mg Intravenous Q4H PRN Collene Gobble, MD   1 mg at 07/22/16 0038  . methadone (DOLOPHINE) tablet 10 mg  10 mg Oral Daily Rigoberto Noel, MD   10 mg at 07/22/16 0946  . morphine 2 MG/ML injection 2 mg  2 mg Intravenous Q4H PRN Nita Sells, MD   2 mg at 07/22/16 0815  . pentafluoroprop-tetrafluoroeth (GEBAUERS) aerosol 1 application  1 application Topical PRN Deterding, Jeneen Rinks, MD      . sevelamer carbonate (RENVELA) tablet 1,600 mg  1,600 mg Oral TID WC Madelon Lips, MD   1,600 mg at 07/22/16 4403  . sodium chloride flush (NS) 0.9 % injection 10-40 mL  10-40 mL Intracatheter Q12H Desai, Rahul P, PA-C   10 mL at 07/20/16 2100  . sodium chloride flush (NS) 0.9 % injection 10-40 mL  10-40 mL Intracatheter PRN Shearon Stalls, Rahul P, PA-C        Musculoskeletal: Strength & Muscle Tone: within normal limits Gait & Station: unable to stand Patient leans: N/A  Psychiatric Specialty Exam: Physical Exam as per history and physical   ROS patient has been talking loud as he cannot hear well and asking people to say loud to him.  No Fever-chills, No Headache, No changes with Vision or hearing, reports vertigo No problems swallowing food or Liquids, No Chest pain, Cough or Shortness of Breath, No Abdominal pain, No Nausea or Vommitting,  Bowel movements are regular, No Blood in stool or Urine, No dysuria, No new skin rashes or bruises, No new joints pains-aches,  No new weakness, tingling, numbness in any extremity, No recent weight gain or loss, No polyuria, polydypsia or polyphagia,  A full 10 point Review of Systems was done, except as stated above, all other Review of Systems were negative.  Blood pressure 128/62, pulse 82, temperature 98.6 F (37 C), temperature source Oral, resp. rate 18, height 6' (1.829 m), weight 103.8 kg (228 lb 13.4 oz), SpO2 95 %.Body mass index is 31.04 kg/m.  General Appearance: Casual  Eye Contact:  Good  Speech:  Clear and Coherent and increased volume as he has hearing impairment.  Volume:  Increased  Mood:  Euthymic  Affect:  Appropriate and Congruent  Thought Process:  Coherent and Goal Directed  Orientation:  Full (Time, Place, and Person)  Thought Content:  WDL  Suicidal Thoughts:  No  Homicidal Thoughts:  No  Memory:  Immediate;   Good Recent;   Fair Remote;   Fair  Judgement:  Impaired  Insight:  Good  Psychomotor Activity:  Normal  Concentration:  Concentration: Good and Attention Span: Fair  Recall:  [employer] of Knowledge:  Good  Language:  Good  Akathisia:  Negative  Handed:  Right  AIMS (if indicated):     Assets:  Communication Skills Desire for Improvement Financial Resources/Insurance Housing Leisure Time Resilience Social Support Transportation  ADL's:  Intact  Cognition:  WNL  Sleep:        Treatment Plan Summary: 21 years old male with the opioid dependence admitted with a status post heroin overdose, rhabdomyolysis, acute kidney injury and acute respiratory failure with hypoxia required intubation on admission. Patient has been recovering from his rhabdomyolysis and hypoxia and continued treatment with hemodialysis for acute renal failure. Patient endorses relapsing about 3 weeks ago and overdose is unintentional.  Patient denies acute  suicidal/homicidal ideation, intention or plans Patient contract for safety while in the hospital Patient has been working with the is [facility] and family regarding 6 month rehabilitation program which is not in place yet Patient was able to stay sober minimum 3 months to maximize 12  months after previous rehabs Discontinue benzodiazepines and opiates which patient has no cravings or withdrawal symptoms at this time Recommended no psychotropic medication at this visit Appreciate psychiatric consultation and we sign off as of today Please contact [phone] or [phone] if needs further assistance  Disposition: Patient will be referred to the residential substance abuse treatment rehabilitation when medically stable. No evidence of imminent risk to self or others at present.   Supportive therapy provided about ongoing stressors.  Ambrose Finland, MD 07/22/2016 10:27 AM

## 2016-07-22 NOTE — Progress Notes (Addendum)
Patient bleeding from penis.  Flushed foley catheter with 60 mL NS per MD order.  40 mL clear, yellow urine return.

## 2016-07-23 ENCOUNTER — Inpatient Hospital Stay (HOSPITAL_COMMUNITY): Payer: BLUE CROSS/BLUE SHIELD

## 2016-07-23 LAB — RENAL FUNCTION PANEL
ALBUMIN: 2.7 g/dL — AB (ref 3.5–5.0)
Anion gap: 16 — ABNORMAL HIGH (ref 5–15)
BUN: 108 mg/dL — AB (ref 6–20)
CALCIUM: 8.8 mg/dL — AB (ref 8.9–10.3)
CO2: 21 mmol/L — ABNORMAL LOW (ref 22–32)
CREATININE: 12.28 mg/dL — AB (ref 0.61–1.24)
Chloride: 93 mmol/L — ABNORMAL LOW (ref 101–111)
GFR calc Af Amer: 6 mL/min — ABNORMAL LOW (ref >60)
GFR, EST NON AFRICAN AMERICAN: 5 mL/min — AB (ref >60)
GLUCOSE: 94 mg/dL (ref 65–99)
PHOSPHORUS: 13 mg/dL — AB (ref 2.5–4.6)
POTASSIUM: 5.9 mmol/L — AB (ref 3.5–5.1)
SODIUM: 130 mmol/L — AB (ref 135–145)

## 2016-07-23 LAB — CBC
HEMATOCRIT: 23.2 % — AB (ref 39.0–52.0)
Hemoglobin: 7.7 g/dL — ABNORMAL LOW (ref 13.0–17.0)
MCH: 29.3 pg (ref 26.0–34.0)
MCHC: 33.2 g/dL (ref 30.0–36.0)
MCV: 88.2 fL (ref 78.0–100.0)
PLATELETS: 421 10*3/uL — AB (ref 150–400)
RBC: 2.63 MIL/uL — ABNORMAL LOW (ref 4.22–5.81)
RDW: 14.5 % (ref 11.5–15.5)
WBC: 8.6 10*3/uL (ref 4.0–10.5)

## 2016-07-23 LAB — OCCULT BLOOD X 1 CARD TO LAB, STOOL: Fecal Occult Bld: POSITIVE — AB

## 2016-07-23 MED ORDER — MORPHINE SULFATE (PF) 2 MG/ML IV SOLN: 1.0000 mg | INTRAVENOUS | Status: DC | PRN

## 2016-07-23 MED ORDER — DARBEPOETIN ALFA 150 MCG/0.3ML IJ SOSY
PREFILLED_SYRINGE | INTRAMUSCULAR | Status: AC
  Filled 2016-07-23: qty 0.3

## 2016-07-23 MED ORDER — FERROUS SULFATE 325 (65 FE) MG PO TABS
325.0000 mg | ORAL_TABLET | Freq: Two times a day (BID) | ORAL | Status: DC
  Administered 2016-07-23 – 2016-07-28 (×11): 325 mg via ORAL
  Filled 2016-07-23 (×11): qty 1

## 2016-07-23 MED ORDER — PREDNISONE 50 MG PO TABS
60.0000 mg | ORAL_TABLET | Freq: Every day | ORAL | Status: DC
  Administered 2016-07-23 – 2016-07-26 (×4): 60 mg via ORAL
  Filled 2016-07-23 (×4): qty 1

## 2016-07-23 MED ORDER — MORPHINE SULFATE (CONCENTRATE) 10 MG/0.5ML PO SOLN
5.0000 mg | ORAL | Status: DC | PRN
  Administered 2016-07-24 – 2016-08-05 (×5): 5 mg via ORAL
  Filled 2016-07-23 (×7): qty 0.5

## 2016-07-23 MED ORDER — NEPRO/CARBSTEADY PO LIQD
237.0000 mL | Freq: Two times a day (BID) | ORAL | Status: DC
  Administered 2016-07-23 – 2016-08-05 (×22): 237 mL via ORAL

## 2016-07-23 MED ORDER — BACITRACIN-NEOMYCIN-POLYMYXIN OINTMENT TUBE
TOPICAL_OINTMENT | Freq: Two times a day (BID) | CUTANEOUS | Status: DC
  Administered 2016-07-24 – 2016-07-25 (×3): via TOPICAL
  Administered 2016-07-26: 1 via TOPICAL
  Administered 2016-07-26 – 2016-07-27 (×2): via TOPICAL
  Administered 2016-07-28: 1 via TOPICAL
  Administered 2016-07-28 – 2016-08-06 (×14): via TOPICAL
  Filled 2016-07-23: qty 14.17

## 2016-07-23 NOTE — Procedures (Signed)
Patient seen and examined on Hemodialysis. QB 400 mL/ min UF goal 3L  Treatment adjusted as needed.  Urine output increasing- hopefully will start having renal recovery.  Blake ButtnerElizabeth Montserrat Shek MD [facility] pgr [phone] 1:50 PM

## 2016-07-23 NOTE — Progress Notes (Addendum)
1 - Urethral Injury - catheter trauma resulting in distal urethral / meatual injury presntly being managed with healing over large bore catheter (59F) last placed 07/13/16 over wire.  2 - Gross Hematuria - blood at meatus following heparanization at dialysis 6/19. This resolved after few hours.   3 - Acute Renal Failure - due to rhabdo / drug overdose, now on dialysis per nephrology.  Today "Blake Kramer" is seen for eval of continued gross hematuria after heparnization / dialysis. Per nephrology his UOP is picking up. Pt said he feels like the catheter is draining well and he doesn't have an urge to void.    Intake/Output Summary (Last 24 hours) at 07/23/16 1756 Last data filed at 07/23/16 1400  Gross per 24 hour  Intake              180 ml  Output             3361 ml  Net            -3181 ml     PE: Pt in NAD Abd - sp area non-tende, no obvious distended bladder.  GU - dried blood around foley that has stuck to urethra. Sister says he won't let anyone clean the catheter.    I placed a warm wash clot on the catheter and meatus to dissolve the blood. Pt refused to let me irrigate the foley. I told him that clots in the bladder can prevent the catheter from draining, cause retention, hydro and prolong renal failure. Also, we need to assess the amount of bleeding. All of this can be life-threatening. Again, he refused to let me irrigate. He said he felt fine and the catheter felt fine. He said it hurt too much after the Dr. Murlean CallerIrrigated the other night, but that he would let the night nurse tonight irrigate it. He said the night nurse from last night flushed it and the fluid "went in and out". I said the nurse could give him pain meds and then we could try the irrigation but again he was adamant and refused. His sister tried to encourage him to let me irrigate.    Assessment/Plan:  1 - Urethral Injury - catheter needs to remain in situ for healing of this and for hematuria tamponade.   2 -  Gross Hematuria - likely from known urethral injury in setting of heparinization. Catheter in place and seems to be draining well. Urine thin and he's had really good UOP. Irrigate PRN by hand.  If there's any question about the catheter draining I suppose and renal / bladder US would make sure he's decompressed.   3 - Acute Renal Failure - per nephrology  Discussed with Dr. Mahala Kramer - please page GU on call with any questions or concerns.

## 2016-07-23 NOTE — Progress Notes (Signed)
Foley flushed with 60 cc, catheter is patent

## 2016-07-23 NOTE — Progress Notes (Signed)
PROGRESS NOTE    Blake Kramer  ZOX:[id number] DOB: [birth year] DOA: 07/09/2016 PCP: No primary care provider on file.  Outpatient Specialists:     Brief Narrative:    21 ? Allegedly drug user and dealer [see psych Md note] Admit by PCCM from [facility] with OD, Fentanyl/morphine Intubated for airway protxn Noted  Mild hypotension on admission, positive troponin, pinpoint pupils, hyperkalemia and oliguria Narcan unrespnsive Lactate 10.4, SCr 4.4, K 7.1, AST 1453, ALT 1008, AG 23, WBC 18.9, Hgb 17.8, Trop 4.48.  UDS positive for opiates  Assessment & Plan:   Principal Problem:   Drug overdose Active Problems:   AKI (acute kidney injury) (HCC)   Acute respiratory failure with hypoxia (HCC)   Urethral bleeding   Illicit Drug overdose - intent unclear  Pt now more alert and lucid  Psychiatry has seen and cleared him from suicidality perspective   Acute resp failure wi and pharmacy to th hypoxia Resolved -due to OD   Rhabdomyolysis resolved  Urethral trauma  Repeat urethral trauma 6/19 probably secondary to heparinization in dialysis [place] will obstructive uropathy secondary to blood clots Acute oliguric kidney injury secondary to rhabdomyolysis on CRRT in the ICU not yet declared ESRD Patient self DC'd Foley  At dilaysis on 6/19 expeirenced pain and found to be in pool of blood-this seems to have ceased  Appreciate Dr. Berneice Heinrich input, felt 2/2 heparin c Dialysis   still having clots and passed 500 cc of urine with clots 6/21  getting US renal r/o hydronephrosis as per Dr. Mena Goes, appreciate input  -plan for at least 4 wksper urology  - do NOTremove Foley catheter -Have explained clearly to sister who is a nurse about uncertainty whether he will need ongoing dialysis  Shock Liver / Transaminitis Due to above - resolved   Acute blood loss due to urethral trauma See above Hemoglobin currently 8.2 Transfusion threshold below 8, currently 7.7, if labs concordant  6/22 will give 1 U PRBC on 6/22  iron studies 6/21 showed iron of 36 and saturation of 11 recieving EPO/IV iron per nephro Start iron feso4 325 bid  Acute encephalopathy  due to intentionaldrug overdose + sedation(Heroin and Fentanyl) - methadone 10 mg daily Have discontinued IV morphine, use roxanol for foley flushes Have discontinued psychotropics Patient cleared from this perspective of from psychiatry and now await medical disposition in terms of renal insufficiency  R>L hearing loss Etiology unclear-Hypotension, ?Precedex? Propofol D/w Dr. Pollyann Kennedy of ENT 6/21 Will attempt to get Audiology eval inpatient Feels reasonable use prednisone 60 daily and re-eval for improvement   Subjective:  Fair no nausea no vomiting lying in bed Initially was refusing/a Foley  catheter currently doing okay Seems a lot more cognizant current and can orient fairly well   Objective: Vitals:   07/23/16 1030 07/23/16 1100 07/23/16 1130 07/23/16 1206  BP: 131/70 128/75 123/65 122/63  Pulse: 91 93 76 94  Resp:    18  Temp:    98.2 F (36.8 C)  TempSrc:    Oral  SpO2:    100%  Weight:    100.7 kg (222 lb 0.1 oz)  Height:        Intake/Output Summary (Last 24 hours) at 07/23/16 1701 Last data filed at 07/23/16 1400  Gross per 24 hour  Intake              180 ml  Output             3361 ml  Net            -  3181 ml   Filed Weights   07/22/16 2126 07/23/16 0745 07/23/16 1206  Weight: 104.3 kg (230 lb) 103.8 kg (228 lb 13.4 oz) 100.7 kg (222 lb 0.1 oz)    Examination:  Alert present oriented S1-S2 no murmur rub or gallop EOMI NCAT Abdomen soft nontender nondistended no rebound Penis has some clot around the urethra Foley catheter has dark blood-tinged urine in it    Data Reviewed: I have personally reviewed following labs and imaging studies  CBC:  Recent Labs Lab 07/20/16 0250 07/21/16 0514 07/21/16 1419 07/22/16 0421 07/23/16 0449  WBC 12.1* 10.6* 11.1* 9.6 8.6    NEUTROABS  --   --  9.0*  --   --   HGB 9.9* 8.7* 9.2* 8.2* 7.7*  HCT 30.3* 25.9* 27.8* 25.3* 23.2*  MCV 88.9 87.5 87.1 88.5 88.2  PLT 332 368 360 391 421*   Basic Metabolic Panel:  Recent Labs Lab 07/19/16 0233 07/20/16 0250 07/21/16 0514 07/22/16 0421 07/23/16 0448  NA 131* 130*  130* 129* 132* 130*  K 4.8 5.4*  5.4* 6.0* 5.6* 5.9*  CL 94* 92*  92* 91* 93* 93*  CO2 26 24  25  20* 25 21*  GLUCOSE 111* 97  97 100* 107* 94  BUN 43* 78*  78* 114* 70* 108*  CREATININE 6.27* 9.52*  9.78* 12.55* 9.31* 12.28*  CALCIUM 8.8* 9.1  9.0 8.6* 8.7* 8.8*  MG  --  2.6*  --   --   --   PHOS 7.8* 10.7* 12.1* 10.7* 13.0*   GFR: Estimated Creatinine Clearance: 11.8 mL/min (A) (by C-G formula based on SCr of 12.28 mg/dL (H)). Liver Function Tests:  Recent Labs Lab 07/17/16 0400  07/19/16 0233 07/20/16 0250 07/21/16 0514 07/22/16 0421 07/23/16 0448  AST 104*  --   --   --   --   --   --   ALT 223*  --   --   --   --   --   --   ALKPHOS 94  --   --   --   --   --   --   BILITOT 0.7  --   --   --   --   --   --   PROT 6.1*  --   --   --   --   --   --   ALBUMIN 2.7*  < > 2.7* 2.8* 2.6* 2.6* 2.7*  < > = values in this interval not displayed. No results for input(s): LIPASE, AMYLASE in the last 168 hours. No results for input(s): AMMONIA in the last 168 hours. Coagulation Profile: No results for input(s): INR, PROTIME in the last 168 hours. Cardiac Enzymes: No results for input(s): CKTOTAL, CKMB, CKMBINDEX, TROPONINI in the last 168 hours. BNP (last 3 results) No results for input(s): PROBNP in the last 8760 hours. HbA1C: No results for input(s): HGBA1C in the last 72 hours. CBG:  Recent Labs Lab 07/19/16 2334 07/20/16 0312 07/20/16 0837 07/20/16 1720 07/20/16 2152  GLUCAP 98 103* 100* 108* 99   Lipid Profile: No results for input(s): CHOL, HDL, LDLCALC, TRIG, CHOLHDL, LDLDIRECT in the last 72 hours. Thyroid Function Tests: No results for input(s): TSH, T4TOTAL,  FREET4, T3FREE, THYROIDAB in the last 72 hours. Anemia Panel:  Recent Labs  07/22/16 0421  FERRITIN 190  TIBC 318  IRON 36*   Urine analysis: No results found for: COLORURINE, APPEARANCEUR, LABSPEC, PHURINE, GLUCOSEU, HGBUR, BILIRUBINUR, KETONESUR, PROTEINUR, UROBILINOGEN, NITRITE, LEUKOCYTESUR Sepsis  Labs: @LABRCNTIP (procalcitonin:4,lacticidven:4)  )No results found for this or any previous visit (from the past 240 hour(s)).    Radiology Studies: No results found.  Scheduled Meds: . darbepoetin (ARANESP) injection - DIALYSIS  150 mcg Intravenous Q Thu-HD  . docusate sodium  100 mg Oral BID  . feeding supplement (NEPRO CARB STEADY)  237 mL Oral BID BM  . heparin  5,000 Units Subcutaneous Q8H  . methadone  10 mg Oral Daily  . predniSONE  60 mg Oral QAC breakfast  . sevelamer carbonate  1,600 mg Oral TID WC  . sodium chloride flush  10-40 mL Intracatheter Q12H   Continuous Infusions: . sodium chloride    . sodium chloride    . ferric gluconate (FERRLECIT/NULECIT) IV 125 mg (07/23/16 1032)     LOS: 14 days    Time spent: 23    Pleas Koch, MD [facility]   If 7PM-7AM, please contact night-coverage [email or web address] Password [email or web address] 07/23/2016, 5:01 PM

## 2016-07-23 NOTE — Progress Notes (Signed)
Nutrition Follow-up  DOCUMENTATION CODES:   Obesity unspecified  INTERVENTION:   -Change supplement to Nepro BID between meals (Boost Breeze discontinued)  -Diet Handouts on Phosphorus and Potassium provided to pt; reviewed basics and will provide further education on follow-up  -Pt receiving Nepro between meals at 1000 and 1400 and also receiving a renal bedtime snack. Recommend ordering Renvela for snacks/supplements between meals  NUTRITION DIAGNOSIS:   Increased nutrient needs related to acute illness as evidenced by estimated needs.  Continues but being addressed via supplements  GOAL:   Patient will meet greater than or equal to 90% of their needs  Progressing  MONITOR:   PO intake, Supplement acceptance, Labs, I & O's  REASON FOR ASSESSMENT:   Consult, Ventilator Assessment of nutrition requirement/status  ASSESSMENT:   Pt with no know PMH admitted after presumed heroine and fentanyl OD possibly down for 10 hours. Intubated with rhabdo, hyperkalemia, AKI.   6/18 Tunneled HD cath placed Pt receiving intermittent HD 3x weekly at present, 2K bath, pt oligruic  PO intake on Renal Diet, 50-100% of meals. Pt reports eating at least Pt also receiving Boost Breeze but pt does not like. Pt eating bed time snack  Weight has been trending down since admission; pt weighed 246 pounds on admission, current wt 228 pounds. Pt only net negative 1.6 L per I/O flow sheet.   Labs: sodium 130, potassium 5.9, Creatinine 12.28, phosphorus 13, corrected calcium 9.84, albumin 2.7 Meds: renvela 1600 mg TID with meals  Diet Order:  Diet renal with fluid restriction Fluid restriction: 1200 mL Fluid; Room service appropriate? Yes; Fluid consistency: Thin  Skin:  Reviewed, no issues  Last BM:  no BM since admission ???? per chart  Height:   Ht Readings from Last 1 Encounters:  07/09/16 6' (1.829 m)    Weight:   Wt Readings from Last 1 Encounters:  07/23/16 228 lb 13.4 oz  (103.8 kg)    Ideal Body Weight:  80.9 kg  BMI:  Body mass index is 31.04 kg/m.  Estimated Nutritional Needs:   Kcal:  2200-2400  Protein:  100-115 gm  Fluid:  per MD  EDUCATION NEEDS:   No education needs identified at this time  Romelle StarcherCate Neal Oshea MS, RD, LDN [phone] Pager  [phone] Weekend/On-Call Pager

## 2016-07-23 NOTE — Progress Notes (Signed)
Subjective:  HD today.  No acute events.  Increased urine output-- hopefully some renal recovery starting  Objective: Vital signs in last 24 hours: Temp:  [97.8 F (36.6 C)-98.6 F (37 C)] 98.2 F (36.8 C) (06/21 1206) Pulse Rate:  [65-94] 94 (06/21 1206) Resp:  [18] 18 (06/21 1206) BP: (118-140)/(60-75) 122/63 (06/21 1206) SpO2:  [97 %-100 %] 100 % (06/21 1206) Weight:  [100.7 kg (222 lb 0.1 oz)-104.3 kg (230 lb)] 100.7 kg (222 lb 0.1 oz) (06/21 1206) Weight change: -2.473 kg (-5 lb 7.2 oz)  Intake/Output from previous day: 06/20 0701 - 06/21 0700 In: 480 [P.O.:360] Out: 160 [Urine:160] Intake/Output this shift: Total I/O In: -  Out: 3291 [Other:3291]  GEN sleeping, pale, NAD HEENT: seems a little hard of hearing NECK no JVD PULM clear anteriorly CV III/VI systolic murmur LLSB ABD mildly distended EXT no LE edema  Lab Results:  Recent Labs  07/22/16 0421 07/23/16 0449  WBC 9.6 8.6  HGB 8.2* 7.7*  HCT 25.3* 23.2*  PLT 391 421*   BMET:   Recent Labs  07/22/16 0421 07/23/16 0448  NA 132* 130*  K 5.6* 5.9*  CL 93* 93*  CO2 25 21*  GLUCOSE 107* 94  BUN 70* 108*  CREATININE 9.31* 12.28*  CALCIUM 8.7* 8.8*   No results for input(s): PTH in the last 72 hours. Iron Studies:   Recent Labs  07/22/16 0421  IRON 36*  TIBC 318  FERRITIN 190    Studies/Results: No results found.  I have reviewed the patient's current medications.  Assessment/Plan: 1 Acute oliguric kidney injury: secondary to rhabdo.  He previously required CRRT has no signs of renal recovery yet.  He has a TDC now.  We will continue TTS schedule; 2K bath.  He is not receiving heparin with dialysis   2 Drug OD: unintentional- psych has seen, will need residential substance abuse treatment.  3.  Urethral trauma: needs a Foley catheter for 4 weeks- he self-discontinued Foley on 6/11, now has a 3-way Foley in place which is NOT to be removed.  4.  Bone/ mineral: phos in the 12s, renal  diet, fluid restriction, increase renvela to 2400 TID  5.  Anemia: iron panel % sat 11, ferrlicted with HD (started 6/21) and Aranesp 150 mcg Q Thursday (6/21-)   LOS: 14 days   Racine Erby 07/23/2016,1:48 PM

## 2016-07-24 LAB — COMPREHENSIVE METABOLIC PANEL
ALBUMIN: 3.1 g/dL — AB (ref 3.5–5.0)
ALT: 35 U/L (ref 17–63)
AST: 24 U/L (ref 15–41)
Alkaline Phosphatase: 56 U/L (ref 38–126)
Anion gap: 15 (ref 5–15)
BILIRUBIN TOTAL: 0.7 mg/dL (ref 0.3–1.2)
BUN: 64 mg/dL — AB (ref 6–20)
CALCIUM: 9.2 mg/dL (ref 8.9–10.3)
CO2: 23 mmol/L (ref 22–32)
CREATININE: 9.07 mg/dL — AB (ref 0.61–1.24)
Chloride: 93 mmol/L — ABNORMAL LOW (ref 101–111)
GFR calc Af Amer: 9 mL/min — ABNORMAL LOW (ref >60)
GFR calc non Af Amer: 7 mL/min — ABNORMAL LOW (ref >60)
GLUCOSE: 113 mg/dL — AB (ref 65–99)
Potassium: 6.1 mmol/L — ABNORMAL HIGH (ref 3.5–5.1)
Sodium: 131 mmol/L — ABNORMAL LOW (ref 135–145)
TOTAL PROTEIN: 6.9 g/dL (ref 6.5–8.1)

## 2016-07-24 LAB — PHOSPHORUS: Phosphorus: 9.1 mg/dL — ABNORMAL HIGH (ref 2.5–4.6)

## 2016-07-24 LAB — CBC WITH DIFFERENTIAL/PLATELET
BASOS PCT: 0 %
Basophils Absolute: 0 10*3/uL (ref 0.0–0.1)
Eosinophils Absolute: 0 10*3/uL (ref 0.0–0.7)
Eosinophils Relative: 0 %
HEMATOCRIT: 24.8 % — AB (ref 39.0–52.0)
HEMOGLOBIN: 8.2 g/dL — AB (ref 13.0–17.0)
Lymphocytes Relative: 8 %
Lymphs Abs: 0.7 10*3/uL (ref 0.7–4.0)
MCH: 29.1 pg (ref 26.0–34.0)
MCHC: 33.1 g/dL (ref 30.0–36.0)
MCV: 87.9 fL (ref 78.0–100.0)
MONOS PCT: 4 %
Monocytes Absolute: 0.3 10*3/uL (ref 0.1–1.0)
NEUTROS ABS: 7.1 10*3/uL (ref 1.7–7.7)
Neutrophils Relative %: 88 %
Platelets: 414 10*3/uL — ABNORMAL HIGH (ref 150–400)
RBC: 2.82 MIL/uL — ABNORMAL LOW (ref 4.22–5.81)
RDW: 14.2 % (ref 11.5–15.5)
WBC: 8.1 10*3/uL (ref 4.0–10.5)

## 2016-07-24 LAB — MAGNESIUM: Magnesium: 2.5 mg/dL — ABNORMAL HIGH (ref 1.7–2.4)

## 2016-07-24 MED ORDER — PATIROMER SORBITEX CALCIUM 8.4 G PO PACK
8.4000 g | PACK | ORAL | Status: DC
  Administered 2016-07-24 – 2016-07-26 (×2): 8.4 g via ORAL
  Filled 2016-07-24 (×2): qty 4

## 2016-07-24 MED ORDER — PATIROMER SORBITEX CALCIUM 8.4 G PO PACK
8.4000 g | PACK | Freq: Every day | ORAL | Status: DC
  Filled 2016-07-24: qty 4

## 2016-07-24 NOTE — Progress Notes (Signed)
PT Cancellation Note  Patient Details Name: Blake Kramer MRN: [id number] DOB: Sep 09, 1995   Cancelled Treatment:    Reason Eval/Treat Not Completed: Patient declined, no reason specified. Pt reports that he just got his lunch and his family is on there way up to visit. Requests PT return later this evening. Therapy will not be available later on. Will check back tomorrow in the AM.    Colin BroachSabra M. Javona Bergevin PT, DPT  [phone]  07/24/2016, 3:32 PM

## 2016-07-24 NOTE — Progress Notes (Signed)
Subjective:  Tolerated HD yesterday.  Still having gross hematuria.  He is not receiving heparin dialysis.  I have personally reviewed medication history with dialysis staff.   Objective: Vital signs in last 24 hours: Temp:  [98.2 F (36.8 C)-98.7 F (37.1 C)] 98.5 F (36.9 C) (06/22 0948) Pulse Rate:  [77-88] 77 (06/22 0948) Resp:  [15-18] 18 (06/22 0948) BP: (121-133)/(62-67) 130/67 (06/22 0948) SpO2:  [96 %-100 %] 98 % (06/22 0948) Weight:  [103.6 kg (228 lb 6.3 oz)] 103.6 kg (228 lb 6.3 oz) (06/21 2033) Weight change: -0.527 kg (-1 lb 2.6 oz)  Intake/Output from previous day: 06/21 0701 - 06/22 0700 In: 838 [P.O.:838] Out: 4041 [Urine:750] Intake/Output this shift: Total I/O In: 540 [P.O.:480; Other:60] Out: 285 [Urine:285]  GEN sleeping, pale, NAD HEENT: hard of hearing NECK no JVD PULM clear anteriorly CV III/VI systolic murmur LLSB ABD mildly distended EXT no LE edema GU: blood around meatus.    Lab Results:  Recent Labs  07/23/16 0449 07/24/16 0634  WBC 8.6 8.1  HGB 7.7* 8.2*  HCT 23.2* 24.8*  PLT 421* 414*   BMET:   Recent Labs  07/23/16 0448 07/24/16 0634  NA 130* 131*  K 5.9* 6.1*  CL 93* 93*  CO2 21* 23  GLUCOSE 94 113*  BUN 108* 64*  CREATININE 12.28* 9.07*  CALCIUM 8.8* 9.2   No results for input(s): PTH in the last 72 hours. Iron Studies:   Recent Labs  07/22/16 0421  IRON 36*  TIBC 318  FERRITIN 190    Studies/Results: [place] Renal  Result Date: 07/23/2016 CLINICAL DATA:  Subacute onset of renal insufficiency, and hematuria. Initial encounter. EXAM: RENAL / URINARY TRACT ULTRASOUND COMPLETE COMPARISON:  Renal ultrasound performed 07/09/2016 FINDINGS: Right Kidney: Length: 15.3 cm. Echogenicity within normal limits. No mass or hydronephrosis visualized. Left Kidney: Length: 14.9 cm. Mildly increased renal parenchymal echogenicity is noted. No mass or hydronephrosis visualized. Bladder: Decompressed, with a Foley catheter in place.  IMPRESSION: 1. No evidence of hydronephrosis. 2. Mildly increased renal parenchymal echogenicity may reflect medical renal disease. 3. Bladder decompressed, with a Foley catheter in place. No significant blood clot seen within the bladder, though evaluation of the bladder is limited. Electronically Signed   By: [name]   On: 07/23/2016 18:42    I have reviewed the patient's current medications.  Assessment/Plan: 1 Acute oliguric kidney injury: secondary to rhabdo.  He previously required CRRT has no signs of renal recovery yet, although urine output is picking up a little bit.  He has a TDC now.  We will continue TTS schedule; 2K bath.  He is not receiving heparin with dialysis.    2.  Hyperkalemia: likely due to resorption of blood.  Will give Veltassa on non-dialysis days.  He's already on a renal diet.   3 Drug OD: unintentional- psych has seen, will need residential substance abuse treatment.  4.  Urethral trauma: needs a Foley catheter for 4 weeks- he self-discontinued Foley on 6/11, now has a 3-way Foley in place which is NOT to be removed.  Urology following.    5.  Bone/ mineral: phos in the 12s, renal diet, fluid restriction, increase renvela to 2400 TID  6.  Anemia: iron panel % sat 11, ferrlicted with HD (started 6/21) and Aranesp 150 mcg Q Thursday (6/21-)   LOS: 15 days   Emmaclaire Switala 07/24/2016,3:46 PM

## 2016-07-24 NOTE — Progress Notes (Signed)
Attempted x 2 for ambulation but patient refused.

## 2016-07-24 NOTE — Progress Notes (Addendum)
PROGRESS NOTE    Berenice BoutonLindon Troia  ZOX:[id number] DOB: 1996-01-24 DOA: 07/09/2016 PCP: No primary care provider on file.  Outpatient Specialists:     Brief Narrative:    21 ? Allegedly drug user and dealer [see psych Md note] Admit by PCCM from [place] with OD, Fentanyl/morphine Intubated for airway protxn Noted  Mild hypotension on admission, positive troponin, pinpoint pupils, hyperkalemia and oliguria Narcan unrespnsive Lactate 10.4, SCr 4.4, K 7.1, AST 1453, ALT 1008, AG 23, WBC 18.9, Hgb 17.8, Trop 4.48.  UDS positive for opiates  Assessment & Plan:   Principal Problem:   Drug overdose Active Problems:   AKI (acute kidney injury) (HCC)   Acute respiratory failure with hypoxia (HCC)   Urethral bleeding   Illicit Drug overdose - intent unclear  Pt now more alert and lucid  Psychiatry has seen and cleared him from suicidality perspective   Acute resp failure wi and pharmacy to th hypoxia Resolved -due to OD   Urethral trauma  Repeat urethral trauma 6/19 probably secondary to heparinization in dialysis [place] will obstructive uropathy secondary to blood clots Acute oliguric kidney injury secondary to rhabdomyolysis on CRRT in the ICU not yet declared ESRD Patient self DC'd Foley  At dilaysis on 6/19 experienced pain and found to be in pool of blood-this seems to have ceased  Appreciate Dr. Berneice HeinrichManny input, felt 2/2 heparin c Dialysis   still having clots and passed 500 cc of urine with clots 6/21  getting US renal r/o hydronephrosis as per Dr. Mena GoesEskridge, appreciate input  plan for at least 4 wksper urology  do NOTremove Foley catheter Have explained clearly to sister 6/21 who is a nurse about uncertainty whether he will need ongoing dialysis  Shock Liver / Transaminitis Due to above - resolved   Acute blood loss due to urethral trauma See above Hemoglobin currently 8.2 Transfusion threshold below 8, now 8 iron studies 6/21 showed iron of 36 and saturation of  11 receiving EPO/IV iron per nephro Start iron feso4 325 bid  Acute encephalopathy  due to intentionaldrug overdose + sedation(Heroin and Fentanyl) - methadone 10 mg daily Have discontinued IV morphine, use roxanol for foley flushes Have discontinued psychotropics Patient cleared from this perspective of from psychiatry and now await medical disposition in terms of renal insufficiency  R>L hearing loss Etiology unclear-Hypotension, ?Precedex? Propofol D/w Dr. 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 Is it safe to send to OP setting with permanent access in setting chr drug abuse  Subjective:  Joking with family eatin some No new issues Has not been OOB as "I bleed everywhere" No cp no sob  Objective: Vitals:   07/23/16 2033 07/23/16 2210 07/24/16 0524 07/24/16 0948  BP:  124/64 133/62 130/67  Pulse:  88 77 77  Resp:  18 15 18   Temp:  98.5 F (36.9 C) 98.2 F (36.8 C) 98.5 F (36.9 C)  TempSrc:  Oral Oral Oral  SpO2:  100% 96% 98%  Weight: 103.6 kg (228 lb 6.3 oz)     Height:        Intake/Output Summary (Last 24 hours) at 07/24/16 1555 Last data filed at 07/24/16 1300  Gross per 24 hour  Intake             1258 ml  Output             1035 ml  Net              223 ml   Filed Weights   07/23/16 0745 07/23/16 1206 07/23/16 2033  Weight: 103.8 kg (228 lb 13.4 oz) 100.7 kg (222 lb 0.1 oz) 103.6 kg (228 lb 6.3 oz)    Examination:  Alert present oriented S1-S2 no murmur rub or gallop EOMI NCAT Abdomen soft nontender nondistended no rebound Penis not visualized today Foley catheter has dark blood-tinged urine in it    Data Reviewed: I have personally reviewed following labs and imaging studies  CBC:  Recent Labs Lab  07/21/16 0514 07/21/16 1419 07/22/16 0421 07/23/16 0449 07/24/16 0634  WBC 10.6* 11.1* 9.6 8.6 8.1  NEUTROABS  --  9.0*  --   --  7.1  HGB 8.7* 9.2* 8.2* 7.7* 8.2*  HCT 25.9* 27.8* 25.3* 23.2* 24.8*  MCV 87.5 87.1 88.5 88.2 87.9  PLT 368 360 391 421* 414*   Basic Metabolic Panel:  Recent Labs Lab 07/20/16 0250 07/21/16 0514 07/22/16 0421 07/23/16 0448 07/24/16 0634  NA 130*  130* 129* 132* 130* 131*  K 5.4*  5.4* 6.0* 5.6* 5.9* 6.1*  CL 92*  92* 91* 93* 93* 93*  CO2 24  25 20* 25 21* 23  GLUCOSE 97  97 100* 107* 94 113*  BUN 78*  78* 114* 70* 108* 64*  CREATININE 9.52*  9.78* 12.55* 9.31* 12.28* 9.07*  CALCIUM 9.1  9.0 8.6* 8.7* 8.8* 9.2  MG 2.6*  --   --   --  2.5*  PHOS 10.7* 12.1* 10.7* 13.0* 9.1*   GFR: Estimated Creatinine Clearance: 16.2 mL/min (A) (by C-G formula based on SCr of 9.07 mg/dL (H)). Liver Function Tests:  Recent Labs Lab 07/20/16 0250 07/21/16 0514 07/22/16 0421 07/23/16 0448 07/24/16 0634  AST  --   --   --   --  24  ALT  --   --   --   --  35  ALKPHOS  --   --   --   --  56  BILITOT  --   --   --   --  0.7  PROT  --   --   --   --  6.9  ALBUMIN 2.8* 2.6* 2.6* 2.7* 3.1*   No results for input(s): LIPASE, AMYLASE in the last 168 hours. No results for input(s): AMMONIA in the last 168 hours. Coagulation Profile: No results for input(s): INR, PROTIME in the last 168 hours. Cardiac Enzymes: No results for input(s): CKTOTAL, CKMB, CKMBINDEX, TROPONINI in the last 168 hours. BNP (last 3 results) No results for input(s): PROBNP in the last 8760 hours. HbA1C: No results for input(s): HGBA1C in the last 72 hours. CBG:  Recent Labs Lab 07/19/16 2334 07/20/16 0312 07/20/16 0837 07/20/16 1720 07/20/16 2152  GLUCAP 98 103* 100* 108* 99   Lipid Profile: No results for input(s): CHOL, HDL, LDLCALC, TRIG, CHOLHDL, LDLDIRECT in the last 72 hours. Thyroid Function Tests: No results for input(s): TSH, T4TOTAL, FREET4, T3FREE,  THYROIDAB in the last 72 hours. Anemia Panel:  Recent Labs  07/22/16 0421  FERRITIN 190  TIBC 318  IRON 36*   Urine analysis: No results found for: COLORURINE, APPEARANCEUR, LABSPEC, PHURINE, GLUCOSEU, HGBUR, BILIRUBINUR, KETONESUR, PROTEINUR, UROBILINOGEN, NITRITE, LEUKOCYTESUR Sepsis Labs: @LABRCNTIP (procalcitonin:4,lacticidven:4)  )No results found for this or any previous visit (from the past 240 hour(s)).    Radiology  Studies: US Renal  Result Date: 07/23/2016 CLINICAL DATA:  Subacute onset of renal insufficiency, and hematuria. Initial encounter. EXAM: RENAL / URINARY TRACT ULTRASOUND COMPLETE COMPARISON:  Renal ultrasound performed 07/09/2016 FINDINGS: Right Kidney: Length: 15.3 cm. Echogenicity within normal limits. No mass or hydronephrosis visualized. Left Kidney: Length: 14.9 cm. Mildly increased renal parenchymal echogenicity is noted. No mass or hydronephrosis visualized. Bladder: Decompressed, with a Foley catheter in place. IMPRESSION: 1. No evidence of hydronephrosis. 2. Mildly increased renal parenchymal echogenicity may reflect medical renal disease. 3. Bladder decompressed, with a Foley catheter in place. No significant blood clot seen within the bladder, though evaluation of the bladder is limited. Electronically Signed   By: Roanna Raider M.D.   On: 07/23/2016 18:42    Scheduled Meds: . darbepoetin (ARANESP) injection - DIALYSIS  150 mcg Intravenous Q Thu-HD  . docusate sodium  100 mg Oral BID  . feeding supplement (NEPRO CARB STEADY)  237 mL Oral BID BM  . ferrous sulfate  325 mg Oral BID WC  . heparin  5,000 Units Subcutaneous Q8H  . methadone  10 mg Oral Daily  . neomycin-bacitracin-polymyxin   Topical BID  . patiromer  8.4 g Oral Daily  . predniSONE  60 mg Oral QAC breakfast  . sevelamer carbonate  1,600 mg Oral TID WC  . sodium chloride flush  10-40 mL Intracatheter Q12H   Continuous Infusions: . sodium chloride    . sodium chloride    . ferric  gluconate (FERRLECIT/NULECIT) IV 125 mg (07/23/16 1032)     LOS: 15 days    Time spent: 46   Pleas Koch, MD [facility]   If 7PM-7AM, please contact night-coverage [email or web address] Password [facility] 07/24/2016, 3:55 PM

## 2016-07-24 NOTE — Procedures (Signed)
Bedside Audiometric Evaluation  Name:  Blake BoutonLindon Cottier DOB:   Jul 14, 1995 MRN:    [id number]  Reason for Referral: Hearing loss  Pain:  None  Audiological Assessment:  Audiometric Results in dB:              Test reliability good  250 Hz 500 Hz 1000 Hz  2000   Hz 4000 Hz 8000 Hz  Right ear Air conduction 25 40 60 60 60 55  Left ear Air conduction 20 30 55 60 60 45  Left ear Bone conduction 30 40 55 65 55 -   Distortion Product Otoacoustic Emissions: 3000-10,000Hz  Right ear: Absent cochlear outer hair cell responses Left ear: Absent cochlear outer hair cell responses.  Impression: Today's results are consistent with a mild to moderately-severe sensorineural hearing loss bilaterally.  Patient/Family Education:  The patient agreed to testing per MD order.  The test results and recommendations were explained to patient.  Left a copy of the audiogram with the patient.  Recommendations:  1. ENT consult 2. Comprehensive Audiological and hearing aid evaluation as an outpatient.  We do not have this capability as an inpatient.   If you have any questions, please call [phone].  Sherri A. Earlene Plateravis, Au.D., [facility] Doctor of Audiology 07/24/2016 11:38 AM

## 2016-07-25 LAB — RENAL FUNCTION PANEL
Albumin: 3.1 g/dL — ABNORMAL LOW (ref 3.5–5.0)
Anion gap: 17 — ABNORMAL HIGH (ref 5–15)
BUN: 96 mg/dL — ABNORMAL HIGH (ref 6–20)
CO2: 24 mmol/L (ref 22–32)
Calcium: 9.6 mg/dL (ref 8.9–10.3)
Chloride: 92 mmol/L — ABNORMAL LOW (ref 101–111)
Creatinine, Ser: 11.63 mg/dL — ABNORMAL HIGH (ref 0.61–1.24)
GFR calc Af Amer: 6 mL/min — ABNORMAL LOW (ref >60)
GFR calc non Af Amer: 6 mL/min — ABNORMAL LOW (ref >60)
Glucose, Bld: 97 mg/dL (ref 65–99)
POTASSIUM: 5.5 mmol/L — AB (ref 3.5–5.1)
Phosphorus: 9.7 mg/dL — ABNORMAL HIGH (ref 2.5–4.6)
SODIUM: 133 mmol/L — AB (ref 135–145)

## 2016-07-25 NOTE — Progress Notes (Signed)
Patient arrived to hemodialysis unit alert oriented vitals stable. Reinforced treatment and infection control procedures bed in lowest position call bell in reach.

## 2016-07-25 NOTE — Progress Notes (Signed)
PROGRESS NOTE    Blake Kramer  ZOX:[id number] DOB: [birth year] DOA: 07/09/2016 PCP: No primary care provider on file.  Outpatient Specialists:     Brief Narrative:    21 ? Allegedly drug user and dealer [see psych Md note 6/20] Admit by PCCM from [facility] with OD, Fentanyl/morphine Intubated for airway protxn Noted  Mild hypotension on admission, positive troponin, pinpoint pupils, hyperkalemia and oliguria Narcan unrespnsive Lactate 10.4, SCr 4.4, K 7.1, AST 1453, ALT 1008, AG 23, WBC 18.9, Hgb 17.8, Trop 4.48.  UDS positive for opiates  Assessment & Plan:   Principal Problem:   Drug overdose Active Problems:   AKI (acute kidney injury) (HCC)   Acute respiratory failure with hypoxia (HCC)   Urethral bleeding   Illicit Drug overdose - intent unclear  Pt now more alert and lucid  Psychiatry has seen and cleared him from suicidality perspective   Acute resp failure wi and pharmacy to th hypoxia Resolved -due to OD   Urethral trauma  Repeat urethral trauma 6/19 probably secondary to heparinization in dialysis Acute oliguric kidney injury secondary to rhabdomyolysis on CRRT in the ICU not yet declared ESRD Patient self DC'd Foley  At dilaysis on 6/19 experienced pain and found to be in pool of blood-this seems to have ceased  Appreciate Dr. Berneice Heinrich input, felt 2/2 heparin c Dialysis?   still having clots and passed 500 cc of urine with clots 6/21  US renal 6/10 rules out hydronephrosis as per Dr. Mena Goes, appreciate input  plan for at least 4 wksper urology  do NOTremove Foley catheter Have explained clearly to sister 6/21, 6/23 who is a nurse about uncertainty whether he will need ongoing dialysis  Shock Liver / Transaminitis Due to above - resolved   Acute blood loss due to urethral trauma See above Hemoglobin currently 8.2, rpt am Transfusion threshold below 8, now 8 iron studies 6/21 showed iron of 36 and saturation of 11 receiving EPO/IV iron per  nephro Start iron feso4 325 bid  Acute encephalopathy  due to intentionaldrug overdose + sedation(Heroin and Fentanyl) - methadone 10 mg daily Have discontinued IV morphine, use roxanol for foley flushes Have discontinued psychotropics Patient cleared from this perspective of from psychiatry and now await medical disposition in terms of renal insufficiency  R>L hearing loss Etiology unclear-Hypotension, ?Precedex? Propofol D/w Dr. Pollyann Kennedy of ENT 6/21 Audiology input confirms sensorineural hearing loss-mod-severe Feels reasonable use prednisone 60 daily and re-eval for improvement--will start taper 6/24 am He will need OP follow up with ENT on d/c   D/w family at bedsdie 6/21, 6/22 Inpatient dispo unclear-not yet ESRD, would it be safe to send to OP setting with permanent access in setting chr IV heroin drug abuse?  Subjective:  No issues, jovial, watching tv abulated in hall x 2 Still blood in foley No other concerns-eating and drinking  Objective: Vitals:   07/25/16 1030 07/25/16 1100 07/25/16 1105 07/25/16 1132  BP: 107/60 (!) 101/58 109/62 102/60  Pulse: 92 93 91 88  Resp:    18  Temp:   98.1 F (36.7 C) 98.2 F (36.8 C)  TempSrc:   Oral Oral  SpO2:   (P) 99% 98%  Weight:   (P) 98.4 kg (216 lb 14.9 oz)   Height:        Intake/Output Summary (Last 24 hours) at 07/25/16 1722 Last data filed at 07/25/16 1300  Gross per 24 hour  Intake  660 ml  Output             2230 ml  Net            -1570 ml   Filed Weights   07/23/16 2033 07/25/16 0640 07/25/16 1105  Weight: 103.6 kg (228 lb 6.3 oz) 100.4 kg (221 lb 5.5 oz) (P) 98.4 kg (216 lb 14.9 oz)    Examination:  Alert present oriented, no pallor, no ict S1-S2 no murmur rub or gallop EOMI NCAT Abdomen soft nontender nondistended no rebound Penis has no gross hematuria Foley catheter has dark blood-tinged urine in it    Data Reviewed: I have personally reviewed following labs and imaging  studies  CBC:  Recent Labs Lab 07/21/16 0514 07/21/16 1419 07/22/16 0421 07/23/16 0449 07/24/16 0634  WBC 10.6* 11.1* 9.6 8.6 8.1  NEUTROABS  --  9.0*  --   --  7.1  HGB 8.7* 9.2* 8.2* 7.7* 8.2*  HCT 25.9* 27.8* 25.3* 23.2* 24.8*  MCV 87.5 87.1 88.5 88.2 87.9  PLT 368 360 391 421* 414*   Basic Metabolic Panel:  Recent Labs Lab 07/20/16 0250 07/21/16 0514 07/22/16 0421 07/23/16 0448 07/24/16 0634 07/25/16 0507  NA 130*  130* 129* 132* 130* 131* 133*  K 5.4*  5.4* 6.0* 5.6* 5.9* 6.1* 5.5*  CL 92*  92* 91* 93* 93* 93* 92*  CO2 24  25 20* 25 21* 23 24  GLUCOSE 97  97 100* 107* 94 113* 97  BUN 78*  78* 114* 70* 108* 64* 96*  CREATININE 9.52*  9.78* 12.55* 9.31* 12.28* 9.07* 11.63*  CALCIUM 9.1  9.0 8.6* 8.7* 8.8* 9.2 9.6  MG 2.6*  --   --   --  2.5*  --   PHOS 10.7* 12.1* 10.7* 13.0* 9.1* 9.7*   GFR: Estimated Creatinine Clearance: 12.4 mL/min (A) (by C-G formula based on SCr of 11.63 mg/dL (H)). Liver Function Tests:  Recent Labs Lab 07/21/16 0514 07/22/16 0421 07/23/16 0448 07/24/16 0634 07/25/16 0507  AST  --   --   --  24  --   ALT  --   --   --  35  --   ALKPHOS  --   --   --  56  --   BILITOT  --   --   --  0.7  --   PROT  --   --   --  6.9  --   ALBUMIN 2.6* 2.6* 2.7* 3.1* 3.1*   No results for input(s): LIPASE, AMYLASE in the last 168 hours. No results for input(s): AMMONIA in the last 168 hours. Coagulation Profile: No results for input(s): INR, PROTIME in the last 168 hours. Cardiac Enzymes: No results for input(s): CKTOTAL, CKMB, CKMBINDEX, TROPONINI in the last 168 hours. BNP (last 3 results) No results for input(s): PROBNP in the last 8760 hours. HbA1C: No results for input(s): HGBA1C in the last 72 hours. CBG:  Recent Labs Lab 07/19/16 2334 07/20/16 0312 07/20/16 0837 07/20/16 1720 07/20/16 2152  GLUCAP 98 103* 100* 108* 99   Lipid Profile: No results for input(s): CHOL, HDL, LDLCALC, TRIG, CHOLHDL, LDLDIRECT in the last  72 hours. Thyroid Function Tests: No results for input(s): TSH, T4TOTAL, FREET4, T3FREE, THYROIDAB in the last 72 hours. Anemia Panel: No results for input(s): VITAMINB12, FOLATE, FERRITIN, TIBC, IRON, RETICCTPCT in the last 72 hours. Urine analysis: No results found for: COLORURINE, APPEARANCEUR, LABSPEC, PHURINE, GLUCOSEU, HGBUR, BILIRUBINUR, KETONESUR, PROTEINUR, UROBILINOGEN, NITRITE, LEUKOCYTESUR Sepsis Labs: @LABRCNTIP (procalcitonin:4,lacticidven:4)  )  No results found for this or any previous visit (from the past 240 hour(s)).    Radiology Studies: [place] Renal  Result Date: 07/23/2016 CLINICAL DATA:  Subacute onset of renal insufficiency, and hematuria. Initial encounter. EXAM: RENAL / URINARY TRACT ULTRASOUND COMPLETE COMPARISON:  Renal ultrasound performed 07/09/2016 FINDINGS: Right Kidney: Length: 15.3 cm. Echogenicity within normal limits. No mass or hydronephrosis visualized. Left Kidney: Length: 14.9 cm. Mildly increased renal parenchymal echogenicity is noted. No mass or hydronephrosis visualized. Bladder: Decompressed, with a Foley catheter in place. IMPRESSION: 1. No evidence of hydronephrosis. 2. Mildly increased renal parenchymal echogenicity may reflect medical renal disease. 3. Bladder decompressed, with a Foley catheter in place. No significant blood clot seen within the bladder, though evaluation of the bladder is limited. Electronically Signed   By: [name]   On: 07/23/2016 18:42    Scheduled Meds: . darbepoetin (ARANESP) injection - DIALYSIS  150 mcg Intravenous Q Thu-HD  . docusate sodium  100 mg Oral BID  . feeding supplement (NEPRO CARB STEADY)  237 mL Oral BID BM  . ferrous sulfate  325 mg Oral BID WC  . heparin  5,000 Units Subcutaneous Q8H  . methadone  10 mg Oral Daily  . neomycin-bacitracin-polymyxin   Topical BID  . patiromer  8.4 g Oral Once per day on Sun Mon Wed Fri  . predniSONE  60 mg Oral QAC breakfast  . sevelamer carbonate  1,600 mg Oral  TID WC  . sodium chloride flush  10-40 mL Intracatheter Q12H   Continuous Infusions: . sodium chloride    . sodium chloride    . ferric gluconate (FERRLECIT/NULECIT) IV Stopped (07/25/16 1135)     LOS: 16 days    Time spent: 1915   Pleas KochJai Kayani Rapaport, MD [facility](P) [phone]   If 7PM-7AM, please contact night-coverage [email or web address] Password [email or web address] 07/25/2016, 5:22 PM

## 2016-07-25 NOTE — Progress Notes (Signed)
PT Cancellation Note  Patient Details Name: Blake Kramer MRN: [id number] DOB: 1995-06-26   Cancelled Treatment:    Reason Eval/Treat Not Completed: Patient at procedure or test/unavailable. Pt currently in HD, will check back later as time allows.    Colin BroachSabra M. Yomayra Tate PT, DPT  [phone]  07/25/2016, 7:12 AM

## 2016-07-25 NOTE — Progress Notes (Signed)
Subjective:  For HD again today.  No complaints.  Gross hematuria continues.   Objective: Vital signs in last 24 hours: Temp:  [97.6 F (36.4 C)-98.5 F (36.9 C)] 97.6 F (36.4 C) (06/23 0640) Pulse Rate:  [66-86] 82 (06/23 0730) Resp:  [18] 18 (06/23 0640) BP: (101-135)/(62-79) 109/71 (06/23 0730) SpO2:  [98 %-100 %] 99 % (06/23 0454) Weight:  [100.4 kg (221 lb 5.5 oz)] 100.4 kg (221 lb 5.5 oz) (06/23 0640) Weight change: -3.4 kg (-7 lb 7.9 oz)  Intake/Output from previous day: 06/22 0701 - 06/23 0700 In: 1200 [P.O.:1080] Out: 515 [Urine:485] Intake/Output this shift: No intake/output data recorded.  GEN sleeping, pale, NAD HEENT: hard of hearing NECK no JVD PULM clear anteriorly CV III/VI systolic murmur LLSB ABD mildly distended EXT no LE edema GU: Foley in place, draining gross hematuria.   ACCESSCoralyn Kramer: R IJ [facility]  Lab Results:  Recent Labs  07/23/16 0449 07/24/16 0634  WBC 8.6 8.1  HGB 7.7* 8.2*  HCT 23.2* 24.8*  PLT 421* 414*   BMET:   Recent Labs  07/24/16 0634 07/25/16 0507  NA 131* 133*  K 6.1* 5.5*  CL 93* 92*  CO2 23 24  GLUCOSE 113* 97  BUN 64* 96*  CREATININE 9.07* 11.63*  CALCIUM 9.2 9.6   No results for input(s): PTH in the last 72 hours. Iron Studies:  No results for input(s): IRON, TIBC, TRANSFERRIN, FERRITIN in the last 72 hours.  Studies/Results: [place] Renal  Result Date: 07/23/2016 CLINICAL DATA:  Subacute onset of renal insufficiency, and hematuria. Initial encounter. EXAM: RENAL / URINARY TRACT ULTRASOUND COMPLETE COMPARISON:  Renal ultrasound performed 07/09/2016 FINDINGS: Right Kidney: Length: 15.3 cm. Echogenicity within normal limits. No mass or hydronephrosis visualized. Left Kidney: Length: 14.9 cm. Mildly increased renal parenchymal echogenicity is noted. No mass or hydronephrosis visualized. Bladder: Decompressed, with a Foley catheter in place. IMPRESSION: 1. No evidence of hydronephrosis. 2. Mildly increased renal parenchymal  echogenicity may reflect medical renal disease. 3. Bladder decompressed, with a Foley catheter in place. No significant blood clot seen within the bladder, though evaluation of the bladder is limited. Electronically Signed   By: [name]   On: 07/23/2016 18:42    I have reviewed the patient's current medications.  Assessment/Plan: 1 Acute oliguric kidney injury: secondary to rhabdo.  He previously required CRRT has no signs of renal recovery yet, although urine output is picking up a little bit.  He has a TDC now.  We will continue TTS schedule; 2K bath.  He is not receiving heparin with dialysis.  It appears that the rate of rise of his creatinine is decreasing between dialysis treatments.     2.  Hyperkalemia: likely due to resorption of blood.  Will give Veltassa on non-dialysis days.  He's already on a renal diet.   3 Drug OD: unintentional- psych has seen, will need residential substance abuse treatment.  4.  Urethral trauma: needs a Foley catheter for 4 weeks- he self-discontinued Foley on 6/11, now has a 3-way Foley in place which is NOT to be removed.  Urology following.    5.  Bone/ mineral: phos in the 12s, renal diet, fluid restriction, renvela 2400 TID  6.  Anemia: iron panel % sat 11, ferrlicted with HD (started 6/21) and Aranesp 150 mcg Q Thursday (6/21-)   LOS: 16 days   Blake Kramer 07/25/2016,8:43 AM

## 2016-07-25 NOTE — Procedures (Signed)
Patient seen and examined on Hemodialysis. QB 400 mL/ min via TDC UF goal 2L  Treatment adjusted as needed.   Blake ButtnerElizabeth Emeterio Balke MD [facility] pgr [phone] 8:48 AM

## 2016-07-25 NOTE — Progress Notes (Signed)
OT Cancellation Note  Patient Details Name: Berenice BoutonLindon Glogowski MRN: [id number] DOB: [birth year]   Cancelled Treatment:    Reason Eval/Treat Not Completed: Patient at procedure or test/ unavailable. Pt currently in hemodialysis, will check back as schedule allows.  Evette GeorgesLeonard, Darryel Diodato Eva [phone] 07/25/2016, 7:15 AM

## 2016-07-26 LAB — RENAL FUNCTION PANEL
ALBUMIN: 3.1 g/dL — AB (ref 3.5–5.0)
Anion gap: 13 (ref 5–15)
BUN: 59 mg/dL — AB (ref 6–20)
CO2: 28 mmol/L (ref 22–32)
CREATININE: 7.92 mg/dL — AB (ref 0.61–1.24)
Calcium: 9.4 mg/dL (ref 8.9–10.3)
Chloride: 96 mmol/L — ABNORMAL LOW (ref 101–111)
GFR calc Af Amer: 10 mL/min — ABNORMAL LOW (ref >60)
GFR, EST NON AFRICAN AMERICAN: 9 mL/min — AB (ref >60)
GLUCOSE: 86 mg/dL (ref 65–99)
PHOSPHORUS: 7.6 mg/dL — AB (ref 2.5–4.6)
Potassium: 4.4 mmol/L (ref 3.5–5.1)
SODIUM: 137 mmol/L (ref 135–145)

## 2016-07-26 MED ORDER — PREDNISONE 20 MG PO TABS
20.0000 mg | ORAL_TABLET | Freq: Every day | ORAL | Status: AC
  Administered 2016-07-27: 20 mg via ORAL
  Filled 2016-07-26: qty 1

## 2016-07-26 NOTE — Progress Notes (Signed)
Subjective:  No complaints.    Objective: Vital signs in last 24 hours: Temp:  [98.2 F (36.8 C)-98.6 F (37 C)] 98.6 F (37 C) (06/24 0951) Pulse Rate:  [65-77] 77 (06/24 0951) Resp:  [18] 18 (06/24 0951) BP: (118-122)/(56-59) 118/58 (06/24 0951) SpO2:  [99 %-100 %] 100 % (06/24 0951) Weight change: -2 kg (-4 lb 6.6 oz)  Intake/Output from previous day: 06/23 0701 - 06/24 0700 In: 600 [P.O.:600] Out: 3450 [Urine:1450] Intake/Output this shift: No intake/output data recorded.  GEN awake and alert HEENT: hard of hearing NECK no JVD PULM clear anteriorly CV III/VI systolic murmur LLSB ABD mildly distended EXT no LE edema GU: Foley in place, draining gross hematuria.   ACCESSCoralyn Pear: R IJ [facility]  Lab Results:  Recent Labs  07/24/16 0634  WBC 8.1  HGB 8.2*  HCT 24.8*  PLT 414*   BMET:   Recent Labs  07/25/16 0507 07/26/16 0540  NA 133* 137  K 5.5* 4.4  CL 92* 96*  CO2 24 28  GLUCOSE 97 86  BUN 96* 59*  CREATININE 11.63* 7.92*  CALCIUM 9.6 9.4   No results for input(s): PTH in the last 72 hours. Iron Studies:  No results for input(s): IRON, TIBC, TRANSFERRIN, FERRITIN in the last 72 hours.  Studies/Results: No results found.  I have reviewed the patient's current medications.  Assessment/Plan: 1 Acute oliguric kidney injury: secondary to rhabdo.  He previously required CRRT and has transitioned to IHD, last treatment 6/23.  He has a TDC now.  We will continue TTS schedule; 2K bath.  He is not receiving heparin with dialysis.  It appears that the rate of rise of his creatinine is decreasing between dialysis treatments, urine output is picking up, hopefully recovering.  Will make daily determinations whether or not he needs further dialysis.    2.  Hyperkalemia: likely due to resorption of blood.  Resolving.  Will d/c veltassa since it looks like he's having some renal recovery.   3 Drug OD: unintentional- psych has seen, will need residential substance abuse  treatment.  4.  Urethral trauma: needs a Foley catheter for 4 weeks- he self-discontinued Foley on 6/11, now has a 3-way Foley in place which is NOT to be removed.  Urology following.    5.  Bone/ mineral: phos in the 12s, renal diet, fluid restriction, renvela 2400 TID  6.  Anemia: iron panel % sat 11, ferrlicted with HD (started 6/21) and Aranesp 150 mcg Q Thursday (6/21-)   LOS: 17 days   Rhian Asebedo 07/26/2016,1:10 PM

## 2016-07-26 NOTE — Progress Notes (Signed)
PROGRESS NOTE    Blake Kramer  ZOX:[id number] DOB: 12/10/1995 DOA: 07/09/2016 PCP: Patient, No Pcp Per  Outpatient Specialists:     Brief Narrative:    21 ? Allegedly drug user and dealer [see psych Md note 6/20] Admit by PCCM from [place] with OD, Fentanyl/morphine Intubated for airway protxn Noted  Mild hypotension on admission, positive troponin, pinpoint pupils, hyperkalemia and oliguria Narcan unrespnsive Lactate 10.4, SCr 4.4, K 7.1, AST 1453, ALT 1008, AG 23, WBC 18.9, Hgb 17.8, Trop 4.48.  UDS positive for opiates  Initially admitted and needed CRRT transitioning to intermittent dialysis Hospital course complicated by self discontinuation of Foley catheter causing urethral trauma necessitating urgent urological intervention and a coud catheter placement Has not yet and deemed end-stage renal disease and it appears his renal function might slowly be improving Disposition is still unclear  Assessment & Plan:   Principal Problem:   Drug overdose Active Problems:   AKI (acute kidney injury) (HCC)   Acute respiratory failure with hypoxia (HCC)   Urethral bleeding   Illicit Drug overdose - intent unclear  Pt now more alert and lucid  Psychiatry has seen and cleared him from suicidality perspective   Acute resp failure wi and pharmacy to th hypoxia Resolved -due to OD   Urethral trauma  Repeat urethral trauma 6/19 probably secondary to heparinization in dialysis Nursing has been instructed to to get strict I's and O's on the patient, this is sometimes limited by patient's resistance to catheter flushes Patient self DC'd Foley initially 6/11 and needed urgent urological intervention and coud catheter placement At dilaysis on 6/19 experienced pain and found to be in pool of blood-this seems to have ceased  Appreciate Dr. Berneice Heinrich input, felt 2/2 heparin c Dialysis?   still having clots and passed 500 cc of urine with clots 6/21  US renal 6/10 rules out  hydronephrosis as per Dr. Mena Goes, appreciate input  plan for at least 4 wksper urology -do NOTremove Foley catheter  Acute oliguric kidney injury secondary to rhabdomyolysis on CRRT in the ICU not yet declared ESRD uncertain whether he will need ongoing dialysis--Last dialysis 6/23, nephrology seems to think he may not need long-term dialysis which would be good given his drug history.  I'm not sure if he is a candidate for CIR anymore and will ask therapy to see him and may potentially be a candidate for LTAC, we will inquire with case management  Shock Liver / Transaminitis Due to above - resolved   Acute blood loss due to urethral trauma See above Hemoglobin currently 8.2, rpt am Transfusion threshold below 8, now 8 iron studies 6/21 showed iron of 36 and saturation of 11 receiving EPO/IV iron per nephro Start iron feso4 325 bid  Acute encephalopathy  due to intentionaldrug overdose + sedation(Heroin and Fentanyl) - methadone 10 mg daily Have discontinued IV morphine, use roxanol for foley flushes Have discontinued psychotropics Patient cleared from this perspective of from psychiatry and now await medical disposition in terms of renal insufficiency  R>L hearing loss Etiology unclear-Hypotension, ?Precedex? Propofol D/w Dr. 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  Subjective:  Alert pleasant joking Has allowed some catheter flushes and no other issues  Objective: Vitals:   07/25/16 1132 07/25/16 2056 07/26/16 0447 07/26/16 0951  BP: 102/60 (!) 122/56 (!) 119/59 (!) 118/58  Pulse: 88 71 65 77  Resp: 18 18 18 18   Temp: 98.2 F (36.8 C) 98.5 F (36.9 C) 98.2 F  (36.8 C) 98.6 F (37 C)  TempSrc: Oral Oral Oral Oral  SpO2: 98% 99% 99% 100%  Weight:      Height:        Intake/Output Summary (Last 24 hours) at 07/26/16 1646 Last data filed at 07/26/16 1300  Gross per 24 hour  Intake              360 ml  Output             1450 ml  Net            -1090 ml   Filed Weights   07/23/16 2033 07/25/16 0640 07/25/16 1105  Weight: 103.6 kg (228 lb 6.3 oz) 100.4 kg (221 lb 5.5 oz) 98.4 kg (216 lb 14.9 oz)    Examination:  Alert present oriented, no pallor, no ict S1-S2 no murmur rub or gallop EOMI NCAT Abdomen soft nontender nondistended no rebound Penis has no gross hematuria Foley catheter has dark blood-tinged urine in it    Data Reviewed: I have personally reviewed following labs and imaging studies  CBC:  Recent Labs Lab 07/21/16 0514 07/21/16 1419 07/22/16 0421 07/23/16 0449 07/24/16 0634  WBC 10.6* 11.1* 9.6 8.6 8.1  NEUTROABS  --  9.0*  --   --  7.1  HGB 8.7* 9.2* 8.2* 7.7* 8.2*  HCT 25.9* 27.8* 25.3* 23.2* 24.8*  MCV 87.5 87.1 88.5 88.2 87.9  PLT 368 360 391 421* 414*   Basic Metabolic Panel:  Recent Labs Lab 07/20/16 0250  07/22/16 0421 07/23/16 0448 07/24/16 0634 07/25/16 0507 07/26/16 0540  NA 130*  130*  < > 132* 130* 131* 133* 137  K 5.4*  5.4*  < > 5.6* 5.9* 6.1* 5.5* 4.4  CL 92*  92*  < > 93* 93* 93* 92* 96*  CO2 24  25  < > 25 21* 23 24 28   GLUCOSE 97  97  < > 107* 94 113* 97 86  BUN 78*  78*  < > 70* 108* 64* 96* 59*  CREATININE 9.52*  9.78*  < > 9.31* 12.28* 9.07* 11.63* 7.92*  CALCIUM 9.1  9.0  < > 8.7* 8.8* 9.2 9.6 9.4  MG 2.6*  --   --   --  2.5*  --   --   PHOS 10.7*  < > 10.7* 13.0* 9.1* 9.7* 7.6*  < > = values in this interval not displayed. GFR: Estimated Creatinine Clearance: 18.1 mL/min (A) (by C-G formula based on SCr of 7.92 mg/dL (H)). Liver Function Tests:  Recent Labs Lab 07/22/16 0421 07/23/16 0448 07/24/16 [id number] 07/25/16 0507 07/26/16 0540  AST  --   --  24  --    --   ALT  --   --  35  --   --   ALKPHOS  --   --  56  --   --   BILITOT  --   --  0.7  --   --   PROT  --   --  6.9  --   --   ALBUMIN 2.6* 2.7* 3.1* 3.1* 3.1*   No results for input(s): LIPASE, AMYLASE in the last 168 hours. No results for input(s): AMMONIA in the last 168  hours. Coagulation Profile: No results for input(s): INR, PROTIME in the last 168 hours. Cardiac Enzymes: No results for input(s): CKTOTAL, CKMB, CKMBINDEX, TROPONINI in the last 168 hours. BNP (last 3 results) No results for input(s): PROBNP in the last 8760 hours. HbA1C: No results for input(s): HGBA1C in the last 72 hours. CBG:  Recent Labs Lab 07/19/16 2334 07/20/16 0312 07/20/16 0837 07/20/16 1720 07/20/16 2152  GLUCAP 98 103* 100* 108* 99   Lipid Profile: No results for input(s): CHOL, HDL, LDLCALC, TRIG, CHOLHDL, LDLDIRECT in the last 72 hours. Thyroid Function Tests: No results for input(s): TSH, T4TOTAL, FREET4, T3FREE, THYROIDAB in the last 72 hours. Anemia Panel: No results for input(s): VITAMINB12, FOLATE, FERRITIN, TIBC, IRON, RETICCTPCT in the last 72 hours. Urine analysis: No results found for: COLORURINE, APPEARANCEUR, LABSPEC, PHURINE, GLUCOSEU, HGBUR, BILIRUBINUR, KETONESUR, PROTEINUR, UROBILINOGEN, NITRITE, LEUKOCYTESUR Sepsis Labs: @LABRCNTIP (procalcitonin:4,lacticidven:4)  )No results found for this or any previous visit (from the past 240 hour(s)).    Radiology Studies: No results found.  Scheduled Meds: . darbepoetin (ARANESP) injection - DIALYSIS  150 mcg Intravenous Q Thu-HD  . docusate sodium  100 mg Oral BID  . feeding supplement (NEPRO CARB STEADY)  237 mL Oral BID BM  . ferrous sulfate  325 mg Oral BID WC  . heparin  5,000 Units Subcutaneous Q8H  . methadone  10 mg Oral Daily  . neomycin-bacitracin-polymyxin   Topical BID  . predniSONE  60 mg Oral QAC breakfast  . sevelamer carbonate  1,600 mg Oral TID WC  . sodium chloride flush  10-40 mL Intracatheter Q12H    Continuous Infusions: . sodium chloride    . sodium chloride    . ferric gluconate (FERRLECIT/NULECIT) IV Stopped (07/25/16 1135)     LOS: 17 days    Time spent: 15   Pleas Koch, MD [facility]   If 7PM-7AM, please contact night-coverage [email or web address] Password [facility] 07/26/2016, 4:46 PM

## 2016-07-27 LAB — RENAL FUNCTION PANEL
ALBUMIN: 3.1 g/dL — AB (ref 3.5–5.0)
ANION GAP: 14 (ref 5–15)
BUN: 85 mg/dL — AB (ref 6–20)
CALCIUM: 9.5 mg/dL (ref 8.9–10.3)
CO2: 25 mmol/L (ref 22–32)
Chloride: 96 mmol/L — ABNORMAL LOW (ref 101–111)
Creatinine, Ser: 10.08 mg/dL — ABNORMAL HIGH (ref 0.61–1.24)
GFR calc Af Amer: 8 mL/min — ABNORMAL LOW (ref >60)
GFR calc non Af Amer: 7 mL/min — ABNORMAL LOW (ref >60)
GLUCOSE: 86 mg/dL (ref 65–99)
PHOSPHORUS: 7.5 mg/dL — AB (ref 2.5–4.6)
Potassium: 3.8 mmol/L (ref 3.5–5.1)
SODIUM: 135 mmol/L (ref 135–145)

## 2016-07-27 LAB — CBC WITH DIFFERENTIAL/PLATELET
BASOS ABS: 0 10*3/uL (ref 0.0–0.1)
BASOS PCT: 0 %
EOS ABS: 0.2 10*3/uL (ref 0.0–0.7)
Eosinophils Relative: 2 %
HCT: 24.3 % — ABNORMAL LOW (ref 39.0–52.0)
HEMOGLOBIN: 7.9 g/dL — AB (ref 13.0–17.0)
LYMPHS ABS: 2.3 10*3/uL (ref 0.7–4.0)
Lymphocytes Relative: 19 %
MCH: 30.2 pg (ref 26.0–34.0)
MCHC: 32.5 g/dL (ref 30.0–36.0)
MCV: 92.7 fL (ref 78.0–100.0)
MONO ABS: 0.7 10*3/uL (ref 0.1–1.0)
Monocytes Relative: 6 %
NEUTROS ABS: 8.9 10*3/uL — AB (ref 1.7–7.7)
Neutrophils Relative %: 73 %
Platelets: 463 10*3/uL — ABNORMAL HIGH (ref 150–400)
RBC: 2.62 MIL/uL — ABNORMAL LOW (ref 4.22–5.81)
RDW: 14.6 % (ref 11.5–15.5)
WBC: 12.1 10*3/uL — ABNORMAL HIGH (ref 4.0–10.5)

## 2016-07-27 LAB — TYPE AND SCREEN
ABO/RH(D): O POS
Antibody Screen: NEGATIVE

## 2016-07-27 LAB — ABO/RH: ABO/RH(D): O POS

## 2016-07-27 MED ORDER — SODIUM CHLORIDE 0.9 % IV SOLN
INTRAVENOUS | Status: AC
  Administered 2016-07-27: 15:00:00 via INTRAVENOUS

## 2016-07-27 NOTE — Progress Notes (Signed)
Assessment/Plan: 1 Acute oliguric kidney injury: secondary to rhabdo.  He previously required CRRT and has transitioned to IHD, last treatment 6/23.  He has a TDC now. I think he needs volume expansion.  Will give IVF. Hold on HD.            2 Drug OD: unintentional- psych has seen, will need residential substance abuse treatment.  3.  Urethral trauma: needs a Foley catheter for 4 weeks- he self-discontinued Foley on 6/11, now has a 3-way Foley in place which is NOT to be removed.  Urology following.    4.  Anemia: iron panel % sat 11, ferrlicted with HD (started 6/21) and Aranesp 150 mcg Q Thursday (6/21-)  Subjective: Interval History: good UOP, dizzy with standing; wgt down 11 Kg  Objective: Vital signs in last 24 hours: Temp:  [97.9 F (36.6 C)-98.5 F (36.9 C)] 98 F (36.7 C) (06/25 0910) Pulse Rate:  [54-85] 85 (06/25 0910) Resp:  [17-18] 18 (06/25 0910) BP: (117-125)/(66-68) 117/67 (06/25 0910) SpO2:  [95 %-100 %] 100 % (06/25 0910) Weight:  [98.4 kg (217 lb)] 98.4 kg (217 lb) (06/24 2113) Weight change: 0.031 kg (1.1 oz)  Intake/Output from previous day: 06/24 0701 - 06/25 0700 In: 60 [P.O.:60] Out: 1250 [Urine:1250] Intake/Output this shift: Total I/O In: 237 [P.O.:237] Out: 450 [Urine:450]  General appearance: alert and cooperative Neck: no adenopathy, no carotid bruit, no JVD, supple, symmetrical, trachea midline and thyroid not enlarged, symmetric, no tenderness/mass/nodules Resp: clear to auscultation bilaterally GI: soft, non-tender; bowel sounds normal; no masses,  no organomegaly Extremities: extremities normal, atraumatic, no cyanosis or edema  [facility]  Lab Results:  Recent Labs  07/27/16 0519  WBC 12.1*  HGB 7.9*  HCT 24.3*  PLT 463*   BMET:  Recent Labs  07/26/16 0540 07/27/16 0519  NA 137 135  K 4.4 3.8  CL 96* 96*  CO2 28 25  GLUCOSE 86 86  BUN 59* 85*  CREATININE 7.92* 10.08*  CALCIUM 9.4 9.5   No results for input(s): PTH in  the last 72 hours. Iron Studies: No results for input(s): IRON, TIBC, TRANSFERRIN, FERRITIN in the last 72 hours. Studies/Results: No results found.  Scheduled: . darbepoetin (ARANESP) injection - DIALYSIS  150 mcg Intravenous Q Thu-HD  . docusate sodium  100 mg Oral BID  . feeding supplement (NEPRO CARB STEADY)  237 mL Oral BID BM  . ferrous sulfate  325 mg Oral BID WC  . heparin  5,000 Units Subcutaneous Q8H  . methadone  10 mg Oral Daily  . neomycin-bacitracin-polymyxin   Topical BID  . sevelamer carbonate  1,600 mg Oral TID WC  . sodium chloride flush  10-40 mL Intracatheter Q12H    LOS: 18 days   Pegi Milazzo C 07/27/2016,1:36 PM

## 2016-07-27 NOTE — Progress Notes (Addendum)
Physical Therapy Treatment Patient Details Name: Blake BoutonLindon Kramer MRN: [id number] DOB: 05/13/1995 Today's Date: 07/27/2016    History of Present Illness Pt adm with drug overdose. UDS positive for opiates. Pt intubated 6/7 and self extubated 6/11. Pt with AKI and required CRRT which was stopped 6/14. To start HD on 6/16.  Pt pulled out foley with significant trauma. Also pulled out HD cath. No PMH.(per chart 10+ overdoses)    PT Comments    Pt is up to walk with PT and noted his difficulty with mobility related to his pain, but also creating some balance changes and difficulty getting to side of bed.  He is progressing with PT distances for gait, but will need to be a bit more stable to transition home. Pt was at pulse 118 after walk,  In significant pain.  Had to sit x 1 during walk due to onset of feeling very weak, then better to finish walk.  Focus on technique with mobility next visit to have pt figure out a more independent way to get up as he will not have a bed rail and HOB elevated at home.   Follow Up Recommendations  Other (comment)     Equipment Recommendations  Rolling walker with 5" wheels    Recommendations for Other Services       Precautions / Restrictions Precautions Precautions: Fall (telemtry) Precaution Comments: Foley due to significant urethral trauma Restrictions Weight Bearing Restrictions: No    Mobility  Bed Mobility Overal bed mobility: Needs Assistance Bed Mobility: Supine to Sit     Supine to sit: Min assist     General bed mobility comments: avoided bringing RLE off bed as long as possible due to pain  Transfers Overall transfer level: Needs assistance Equipment used: Rolling walker (2 wheeled) Transfers: Sit to/from Stand Sit to Stand: Min guard         General transfer comment: has wide sitting posture to avoid stress on foley  Ambulation/Gait Ambulation/Gait assistance: Min guard Ambulation Distance (Feet): 140 Feet (with one sitting  break due to pain and diaphoresis) Assistive device: Rolling walker (2 wheeled);1 person hand held assist (contact as pt is in pain and sat half way back to room) Gait Pattern/deviations: Step-through pattern;Wide base of support;Trunk flexed;Shuffle;Decreased stride length Gait velocity: reduced Gait velocity interpretation: Below normal speed for age/gender General Gait Details: walker is a helpful tool due to pain and managing foley   Stairs            Wheelchair Mobility    Modified Rankin (Stroke Patients Only)       Balance     Sitting balance-Leahy Scale: Fair Sitting balance - Comments: used arms to hold rail and PT slightly guarded Postural control: Posterior lean Standing balance support: Bilateral upper extremity supported Standing balance-Leahy Scale: Fair                              Cognition Arousal/Alertness: Awake/alert Behavior During Therapy: WFL for tasks assessed/performed Overall Cognitive Status: Within Functional Limits for tasks assessed                                 General Comments: guarded with mobility due to pain from cath      Exercises      General Comments General comments (skin integrity, edema, etc.): pain is limitation due to trauma with cath, but is  affecting safety and standing and sitting stability      Pertinent Vitals/Pain Pain Assessment: Faces Faces Pain Scale: Hurts whole lot Pain Location: groin penis Pain Descriptors / Indicators: Grimacing;Guarding Pain Intervention(s): Limited activity within patient's tolerance;Monitored during session;Premedicated before session;Repositioned (avoided moving foley bag)    Home Living                      Prior Function            PT Goals (current goals can now be found in the care plan section) Acute Rehab PT Goals Patient Stated Goal: to try to walk Progress towards PT goals: Progressing toward goals    Frequency    Min  3X/week      PT Plan Current plan remains appropriate    Co-evaluation              AM-PAC PT "6 Clicks" Daily Activity  Outcome Measure  Difficulty turning over in bed (including adjusting bedclothes, sheets and blankets)?: A Little Difficulty moving from lying on back to sitting on the side of the bed? : Total Difficulty sitting down on and standing up from a chair with arms (e.g., wheelchair, bedside commode, etc,.)?: Total Help needed moving to and from a bed to chair (including a wheelchair)?: A Little Help needed walking in hospital room?: A Little Help needed climbing 3-5 steps with a railing? : A Little 6 Click Score: 14    End of Session Equipment Utilized During Treatment: Gait belt Activity Tolerance: Patient limited by pain Patient left: in bed;with call bell/phone within reach;with nursing/sitter in room Nurse Communication: Mobility status PT Visit Diagnosis: Difficulty in walking, not elsewhere classified (R26.2);Pain Pain - part of body:  (groin, penis)     Time: 1055-1130 PT Time Calculation (min) (ACUTE ONLY): 35 min  Charges:  $Gait Training: 8-22 mins $Therapeutic Activity: 8-22 mins                    G Codes:        Ivar Drape 08-07-16, 11:54 AM   Samul Dada, PT MS Acute Rehab Dept. Number: [facility] [id number] and [facility] [phone]

## 2016-07-27 NOTE — Progress Notes (Signed)
PROGRESS NOTE    Blake Kramer  ZOX:[id number] DOB: 1995/10/18 DOA: 07/09/2016 PCP: Patient, No Pcp Per  Outpatient Specialists:     Brief Narrative:    21 ? Allegedly drug user and dealer [see psych Md note 6/20] [place], Fentanyl/morphine Intubated for airway protxn Noted  Mild hypotension on admission, positive troponin, pinpoint pupils, hyperkalemia and oliguria Narcan unrespnsive Lactate 10.4, SCr 4.4, K 7.1, AST 1453, ALT 1008, AG 23, WBC 18.9, Hgb 17.8, Trop 4.48.  UDS positive for opiates  Initially admitted and needed CRRT transitioning to intermittent dialysis Hospital course complicated by self discontinuation of Foley catheter causing urethral trauma necessitating urgent urological intervention and a coud catheter placement Has not yet and deemed end-stage renal disease and it appears his renal function might slowly be improving Disposition is still unclear  Assessment & Plan:   Principal Problem:   Drug overdose Active Problems:   AKI (acute kidney injury) (HCC)   Acute respiratory failure with hypoxia (HCC)   Urethral bleeding   Illicit Drug overdose - intent unclear  Pt now more alert and lucid  Psychiatry has seen and cleared him from suicidality perspective   Acute resp failure wi and pharmacy to th hypoxia Resolved -due to OD   Urethral trauma  Repeat urethral trauma 6/19 probably secondary to heparinization in dialysis Nursing has been instructed to to get strict I's and O's on the patient, this is sometimes limited by patient's resistance to catheter flushes Patient self DC'd Foley initially 6/11 and needed urgent urological intervention and coud catheter placement At dilaysis on 6/19 experienced pain and found to be in pool of blood-this seems to have ceased  Appreciate Dr. Berneice HeinrichManny input, felt 2/2 heparin c Dialysis?   urine with clots 6/21, but seems to have resolved 6/25  US renal 6/10 rules out hydronephrosis as  per Dr. Mena GoesEskridge, appreciate input  plan for at least 4 wksper urology -do NOTremove Foley catheter  Acute oliguric kidney injury secondary to rhabdomyolysis on CRRT in the ICU not yet declared ESRD Orthostatic hypotension noted 6/25, ? 2/2 to ABLA vs vol depletion from dialysis uncertain whether he will need ongoing dialysis--Last dialysis 6/23, nephrology seems to think he may not need long-term dialysis which would be good given his drug history. He is now passing ~ 1,200 liter sof urine daily-??atn in post-oliguric phase Given IVF today 6/25 per nephro--orthostatics ++ with lows in 60 systolic--not septic  I'm not sure if he is a candidate for CIR anymore and will ask therapy to see him and may potentially be a candidate for LTAC??  Await CM and Sw input  Shock Liver / Transaminitis Due to above - resolved   Acute blood loss due to urethral trauma See above Hemoglobin currently 8.2--->7.9 Might transfuse 6/26 am if hemoglobin any lower, type ans screened 6.25 iron studies 6/21 showed iron of 36 and saturation of 11 receiving EPO/IV iron per nephro Start iron feso4 325 bid   Acute encephalopathy  due to intentionaldrug overdose + sedation(Heroin and Fentanyl) - methadone 10 mg daily Have discontinued IV morphine, use roxanol for foley flushes Have discontinued psychotropics Patient cleared from this perspective of from psychiatry and now await medical disposition in terms of renal insufficiency  R>L hearing loss Etiology unclear-Hypotension, ?Precedex? Propofol D/w Dr. Pollyann Kennedyosen of ENT 6/21 Audiology input confirms sensorineural hearing loss-mod-severe Feels reasonable use prednisone 60 daily--taper-->20mg   6.24--I have d/c steroids 6/25 as patient doesn't discern any benefit He will need  OP follow up with ENT on d/c   D/w family 6/24 Inpatient dispo unclear-not yet ESRD, would it be safe to send to OP setting with permanent access in setting chr IV heroin drug  abuse?  Subjective:  Urine clearing up--now pink Observed patient ambulate very dizzy eating ok HOH not resolved No cp No sob   Objective: Vitals:   07/27/16 0512 07/27/16 0910 07/27/16 1639 07/27/16 1713  BP: 122/66 117/67  (!) 106/53  Pulse: 69 85  60  Resp: 18 18 17    Temp: 98.5 F (36.9 C) 98 F (36.7 C) 98.1 F (36.7 C)   TempSrc:  Oral Oral   SpO2: 97% 100% 99%   Weight:      Height:        Intake/Output Summary (Last 24 hours) at 07/27/16 1803 Last data filed at 07/27/16 1715  Gross per 24 hour  Intake            934.5 ml  Output             1800 ml  Net           -865.5 ml   Filed Weights   07/25/16 0640 07/25/16 1105 07/26/16 2113  Weight: 100.4 kg (221 lb 5.5 oz) 98.4 kg (216 lb 14.9 oz) 98.4 kg (217 lb)    Examination:  Alert present oriented, no pallor, no ict S1-S2 no murmur rub or gallop EOMI NCAT Abdomen soft nontender nondistended no rebound Penis not examined today Foley catheter has lighter pink tinged urine in it    Data Reviewed: I have personally reviewed following labs and imaging studies  CBC:  Recent Labs Lab 07/21/16 1419 07/22/16 0421 07/23/16 0449 07/24/16 0634 07/27/16 0519  WBC 11.1* 9.6 8.6 8.1 12.1*  NEUTROABS 9.0*  --   --  7.1 8.9*  HGB 9.2* 8.2* 7.7* 8.2* 7.9*  HCT 27.8* 25.3* 23.2* 24.8* 24.3*  MCV 87.1 88.5 88.2 87.9 92.7  PLT 360 391 421* 414* 463*   Basic Metabolic Panel:  Recent Labs Lab 07/23/16 0448 07/24/16 0634 07/25/16 0507 07/26/16 0540 07/27/16 0519  NA 130* 131* 133* 137 135  K 5.9* 6.1* 5.5* 4.4 3.8  CL 93* 93* 92* 96* 96*  CO2 21* 23 24 28 25   GLUCOSE 94 113* 97 86 86  BUN 108* 64* 96* 59* 85*  CREATININE 12.28* 9.07* 11.63* 7.92* 10.08*  CALCIUM 8.8* 9.2 9.6 9.4 9.5  MG  --  2.5*  --   --   --   PHOS 13.0* 9.1* 9.7* 7.6* 7.5*   GFR: Estimated Creatinine Clearance: 14.2 mL/min (A) (by C-G formula based on SCr of 10.08 mg/dL (H)). Liver Function Tests:  Recent Labs Lab  07/23/16 0448 07/24/16 1610 07/25/16 0507 07/26/16 0540 07/27/16 0519  AST  --  24  --   --   --   ALT  --  35  --   --   --   ALKPHOS  --  56  --   --   --   BILITOT  --  0.7  --   --   --   PROT  --  6.9  --   --   --   ALBUMIN 2.7* 3.1* 3.1* 3.1* 3.1*   No results for input(s): LIPASE, AMYLASE in the last 168 hours. No results for input(s): AMMONIA in the last 168 hours. Coagulation Profile: No results for input(s): INR, PROTIME in the last 168 hours. Cardiac Enzymes: No results for input(s):  CKTOTAL, CKMB, CKMBINDEX, TROPONINI in the last 168 hours. BNP (last 3 results) No results for input(s): PROBNP in the last 8760 hours. HbA1C: No results for input(s): HGBA1C in the last 72 hours. CBG:  Recent Labs Lab 07/20/16 2152  GLUCAP 99   Lipid Profile: No results for input(s): CHOL, HDL, LDLCALC, TRIG, CHOLHDL, LDLDIRECT in the last 72 hours. Thyroid Function Tests: No results for input(s): TSH, T4TOTAL, FREET4, T3FREE, THYROIDAB in the last 72 hours. Anemia Panel: No results for input(s): VITAMINB12, FOLATE, FERRITIN, TIBC, IRON, RETICCTPCT in the last 72 hours. Urine analysis: No results found for: COLORURINE, APPEARANCEUR, LABSPEC, PHURINE, GLUCOSEU, HGBUR, BILIRUBINUR, KETONESUR, PROTEINUR, UROBILINOGEN, NITRITE, LEUKOCYTESUR Sepsis Labs: @LABRCNTIP (procalcitonin:4,lacticidven:4)  )No results found for this or any previous visit (from the past 240 hour(s)).    Radiology Studies: No results found.  Scheduled Meds: . darbepoetin (ARANESP) injection - DIALYSIS  150 mcg Intravenous Q Thu-HD  . docusate sodium  100 mg Oral BID  . feeding supplement (NEPRO CARB STEADY)  237 mL Oral BID BM  . ferrous sulfate  325 mg Oral BID WC  . heparin  5,000 Units Subcutaneous Q8H  . methadone  10 mg Oral Daily  . neomycin-bacitracin-polymyxin   Topical BID  . sevelamer carbonate  1,600 mg Oral TID WC  . sodium chloride flush  10-40 mL Intracatheter Q12H   Continuous  Infusions: . sodium chloride    . sodium chloride    . sodium chloride 150 mL/hr at 07/27/16 1445  . ferric gluconate (FERRLECIT/NULECIT) IV Stopped (07/25/16 1135)     LOS: 18 days    Time spent: 15   Pleas Koch, MD [facility]   If 7PM-7AM, please contact night-coverage [email or web address] Password [facility] 07/27/2016, 6:03 PM

## 2016-07-27 NOTE — Progress Notes (Signed)
OT Cancellation    07/27/16 1200  OT Visit Information  Last OT Received On 07/27/16  Reason Eval/Treat Not Completed Patient declined, no reason specified (Pt stating "I just want to lay right here." Educated pt on benefits of OOB activities. Pt stating he would rather wait for his sister to do his ADLs. Will return later as schedule allows.)   Blake Kramer MSOT, OTR/L Acute Rehab Pager: [phone] Office: [phone]

## 2016-07-28 LAB — CBC WITH DIFFERENTIAL/PLATELET
BASOS ABS: 0.1 10*3/uL (ref 0.0–0.1)
BASOS PCT: 1 %
EOS PCT: 2 %
Eosinophils Absolute: 0.3 10*3/uL (ref 0.0–0.7)
HCT: 24.6 % — ABNORMAL LOW (ref 39.0–52.0)
Hemoglobin: 7.9 g/dL — ABNORMAL LOW (ref 13.0–17.0)
Lymphocytes Relative: 20 %
Lymphs Abs: 2.1 10*3/uL (ref 0.7–4.0)
MCH: 30 pg (ref 26.0–34.0)
MCHC: 32.1 g/dL (ref 30.0–36.0)
MCV: 93.5 fL (ref 78.0–100.0)
MONO ABS: 0.5 10*3/uL (ref 0.1–1.0)
MONOS PCT: 5 %
Neutro Abs: 7.6 10*3/uL (ref 1.7–7.7)
Neutrophils Relative %: 72 %
PLATELETS: 438 10*3/uL — AB (ref 150–400)
RBC: 2.63 MIL/uL — ABNORMAL LOW (ref 4.22–5.81)
RDW: 15 % (ref 11.5–15.5)
WBC: 10.5 10*3/uL (ref 4.0–10.5)

## 2016-07-28 LAB — RENAL FUNCTION PANEL
ALBUMIN: 3.1 g/dL — AB (ref 3.5–5.0)
Anion gap: 16 — ABNORMAL HIGH (ref 5–15)
BUN: 100 mg/dL — AB (ref 6–20)
CALCIUM: 9.5 mg/dL (ref 8.9–10.3)
CO2: 22 mmol/L (ref 22–32)
CREATININE: 10.78 mg/dL — AB (ref 0.61–1.24)
Chloride: 101 mmol/L (ref 101–111)
GFR calc Af Amer: 7 mL/min — ABNORMAL LOW (ref >60)
GFR calc non Af Amer: 6 mL/min — ABNORMAL LOW (ref >60)
Glucose, Bld: 90 mg/dL (ref 65–99)
PHOSPHORUS: 7.7 mg/dL — AB (ref 2.5–4.6)
Potassium: 4.1 mmol/L (ref 3.5–5.1)
SODIUM: 139 mmol/L (ref 135–145)

## 2016-07-28 LAB — BASIC METABOLIC PANEL
ANION GAP: 16 — AB (ref 5–15)
BUN: 103 mg/dL — ABNORMAL HIGH (ref 6–20)
CALCIUM: 9.1 mg/dL (ref 8.9–10.3)
CO2: 19 mmol/L — ABNORMAL LOW (ref 22–32)
Chloride: 102 mmol/L (ref 101–111)
Creatinine, Ser: 10.46 mg/dL — ABNORMAL HIGH (ref 0.61–1.24)
GFR, EST AFRICAN AMERICAN: 7 mL/min — AB (ref >60)
GFR, EST NON AFRICAN AMERICAN: 6 mL/min — AB (ref >60)
GLUCOSE: 90 mg/dL (ref 65–99)
Potassium: 5 mmol/L (ref 3.5–5.1)
Sodium: 137 mmol/L (ref 135–145)

## 2016-07-28 MED ORDER — SODIUM CHLORIDE 0.9 % IV SOLN
INTRAVENOUS | Status: AC
  Administered 2016-07-28 – 2016-07-29 (×3): via INTRAVENOUS

## 2016-07-28 NOTE — Progress Notes (Signed)
Pt refused orthostatic vitals at this time. Complaining of pain

## 2016-07-28 NOTE — Care Management Note (Addendum)
Case Management Note  Patient Details  Name: Blake Kramer MRN: [id number] Date of Birth: [birth year]  Subjective/Objective:    CM following for progression and d/c planning.                 Action/Plan: 07/28/2016 Met with pt re possible LTAC admission.  Discussed LTAC services and pt verbalized understanding. Pt states that he knows that he will need rehab and his family is checking into [facility]. This is a SNF in [place] near the pt home. This CM informed pt that our CSW would work on this as an option if he needs rehab at the time of d/c. Given pt age and history placement in a SNF may be difficult. Additionally this pt insurance may not approve this level of care.  CSW informed of pt wishes. Clearly this pt is not a candidate at this time as he continues to have acute vs chronic renal failure. The only option at this time would be a LTAC where he could be evaluated daily and have hemodialysis as needed until he has recovery or chronic renal failure is established.   [facility] rep in to discuss LTAC option with the pt. Pt receptive however wants to discuss options with his family. Unclear to this CM if this pt actually understands his options. Pt was encouraged to work with PT and increase his activity here .   Expected Discharge Date:                  Expected Discharge Plan:  Long Term Acute Care (LTAC)  In-House Referral:  Clinical Social Work  Discharge planning Services  CM Consult  Post Acute Care Choice:    Choice offered to:     DME Arranged:    DME Agency:     HH Arranged:    [facility] Agency:     Status of Service:  In process, will continue to follow  If discussed at Long Length of Stay Meetings, dates discussed:    Additional Comments:  Adron Bene, RN 07/28/2016, 4:01 PM

## 2016-07-28 NOTE — Progress Notes (Signed)
Assessment/Plan: 1 Acute oliguric kidney injury: secondary to rhabdo. He previously required CRRT and has transitioned to IHD, last treatment 6/23.He has a TDC now. Needs volume expansion. DId not get IVFs as ordered yesterday. Will give more IVF. Hold on HD.  2 Drug OD: unintentional- psych has seen  3. Urethral trauma: needs a Foley catheter for 4 weeks-   Subjective: Interval History: Still dizzy, good UOP  Objective: Vital signs in last 24 hours: Temp:  [97.4 F (36.3 C)-98.3 F (36.8 C)] 98 F (36.7 C) (06/26 0905) Pulse Rate:  [60-82] 74 (06/26 0905) Resp:  [17-18] 17 (06/26 0905) BP: (106-132)/(53-74) 125/63 (06/26 0905) SpO2:  [97 %-99 %] 99 % (06/26 0905) Weight:  [98.9 kg (218 lb)] 98.9 kg (218 lb) (06/25 2038) Weight change: 0.454 kg (1 lb)  Intake/Output from previous day: 06/25 0701 - 06/26 0700 In: 1649.5 [P.O.:1077; I.V.:572.5] Out: 1950 [Urine:1950] Intake/Output this shift: Total I/O In: 90 [P.O.:90] Out: 550 [Urine:550]  General appearance: alert and cooperative Resp: clear to auscultation bilaterally Cardio: regular rate and rhythm, S1, S2 normal, no murmur, click, rub or gallop Extremities: extremities normal, atraumatic, no cyanosis or edema  Lab Results:  Recent Labs  07/27/16 0519 07/28/16 0448  WBC 12.1* 10.5  HGB 7.9* 7.9*  HCT 24.3* 24.6*  PLT 463* 438*   BMET:  Recent Labs  07/27/16 0519 07/28/16 0448  NA 135 139  K 3.8 4.1  CL 96* 101  CO2 25 22  GLUCOSE 86 90  BUN 85* 100*  CREATININE 10.08* 10.78*  CALCIUM 9.5 9.5   No results for input(s): PTH in the last 72 hours. Iron Studies: No results for input(s): IRON, TIBC, TRANSFERRIN, FERRITIN in the last 72 hours. Studies/Results: No results found.  Scheduled: . darbepoetin (ARANESP) injection - DIALYSIS  150 mcg Intravenous Q Thu-HD  . docusate sodium  100 mg Oral BID  . feeding supplement (NEPRO CARB STEADY)  237 mL Oral BID BM  . ferrous sulfate  325  mg Oral BID WC  . heparin  5,000 Units Subcutaneous Q8H  . methadone  10 mg Oral Daily  . neomycin-bacitracin-polymyxin   Topical BID  . sevelamer carbonate  1,600 mg Oral TID WC  . sodium chloride flush  10-40 mL Intracatheter Q12H    LOS: 19 days   Blake Kramer C 07/28/2016,10:56 AM

## 2016-07-28 NOTE — Progress Notes (Signed)
PROGRESS NOTE    Blake Kramer  ZOX:[id number] DOB: Feb 28, 1995 DOA: 07/09/2016 PCP: Patient, No Pcp Per  Outpatient Specialists:     Brief Narrative:    21 ? Allegedly drug user and dealer [see psych Md note 6/20] Admit by PCCM from [facility] with OD, Fentanyl/morphine Intubated for airway protxn Noted  Mild hypotension on admission, positive troponin, pinpoint pupils, hyperkalemia and oliguria Narcan unrespnsive Lactate 10.4, SCr 4.4, K 7.1, AST 1453, ALT 1008, AG 23, WBC 18.9, Hgb 17.8, Trop 4.48.  UDS positive for opiates  Initially admitted and needed CRRT transitioning to intermittent dialysis Hospital course complicated by self discontinuation of Foley catheter causing urethral trauma necessitating urgent urological intervention and a coud catheter placement Has not yet and deemed end-stage renal disease and it appears his renal function might slowly be improving Disposition is still unclear  Assessment & Plan:   Principal Problem:   Drug overdose Active Problems:   AKI (acute kidney injury) (HCC)   Acute respiratory failure with hypoxia (HCC)   Urethral bleeding   Illicit Drug overdose - intent unclear  Pt now more alert and lucid  Psychiatry has seen and cleared him from suicidality perspective   Acute resp failure wi and pharmacy to th hypoxia Resolved -due to OD   Urethral trauma  Repeat urethral trauma 6/19 probably secondary to heparinization in dialysis Urinary clots clearing-passing more urine as of 6/25 Nursing has been instructed to to get strict I's and O's on the patient, this is sometimes limited by patient's resistance to catheter flushes Patient self DC'd Foley initially 6/11 and needed urgent urological intervention and coud catheter placement At dilaysis on 6/19 experienced pain and found to be in pool of blood-this seems to have ceased  Appreciate Dr. Berneice HeinrichManny input, felt 2/2 heparin c Dialysis?   urine with clots 6/21, but seems to have  resolved 6/25  US renal 6/10 rules out hydronephrosis as per Dr. Mena GoesEskridge, appreciate input  plan for at least 4 wksper urology -do NOTremove Foley catheter  Acute oliguric kidney injury secondary to rhabdomyolysis on CRRT in the ICU not yet declared ESRD Now probably in non-oliguric phase of ATN Orthostatic hypotension noted 6/25, ? 2/2 to ABLA vs vol depletion from dialysis uncertain whether he will need ongoing dialysis--Last dialysis 6/23, nephrology seems to think he may not need long-term dialysis which would be good given his drug history. He is now passing ~ 1,200 liter sof urine daily-??atn in post-oliguric phase Given IVF 175/cc-- 6/25, 6/26--orthostatics resolved--not septic  [facility] We will discuss as below with sister  Shock Liver / Transaminitis Due to above - resolved   Acute blood loss due to urethral trauma See above Hemoglobin currently 8.2--->7.9, concordant -his transfusion threshold is less than 7 iron studies 6/21 showed iron of 36 and saturation of 11 receiving EPO/IV iron per nephro Start iron feso4 325 bid   Acute encephalopathy  due to intentionaldrug overdose + sedation(Heroin and Fentanyl) - methadone 10 mg daily Have discontinued IV morphine, use roxanol for foley flushes Have discontinued psychotropics Patient cleared from this perspective of from psychiatry and now await medical disposition in terms of renal insufficiency  R>L hearing loss Etiology unclear-Hypotension, ?Precedex? Propofol D/w Dr. Pollyann Kennedyosen of ENT 6/21 Audiology input confirms sensorineural hearing loss-mod-severe prednisone 60 daily--taper-->20mg   6.24--I have d/c steroids 6/25 as patient doesn't discern any benefit He will need OP follow up with ENT on d/c   D/w family 6/24  Inpatient dispo unclear-not yet ESRD, would it be safe to send to OP setting with permanent access in setting chr IV heroin drug  abuse?  Subjective:  Urine clearing up--now pink Less dizzy, getting IV fluids No diarr No fever   Objective: Vitals:   07/27/16 1713 07/27/16 2038 07/28/16 0512 07/28/16 0905  BP: (!) 106/53 132/74 123/61 125/63  Pulse: 60 82 68 74  Resp:  18 18 17   Temp:  98.3 F (36.8 C) 97.4 F (36.3 C) 98 F (36.7 C)  TempSrc:  Oral Oral Oral  SpO2:  97% 99% 99%  Weight:  98.9 kg (218 lb)    Height:        Intake/Output Summary (Last 24 hours) at 07/28/16 1649 Last data filed at 07/28/16 1400  Gross per 24 hour  Intake             1357 ml  Output             2200 ml  Net             -843 ml   Filed Weights   07/25/16 1105 07/26/16 2113 07/27/16 2038  Weight: 98.4 kg (216 lb 14.9 oz) 98.4 kg (217 lb) 98.9 kg (218 lb)    Examination:  Alert present oriented, no pallor, no ict S1-S2 no murmur rub or gallop EOMI NCAT Abdomen soft nontender nondistended no rebound Penis not examined  Foley catheter has lighter pink tinged urine in it    Data Reviewed: I have personally reviewed following labs and imaging studies  CBC:  Recent Labs Lab 07/22/16 0421 07/23/16 0449 07/24/16 0634 07/27/16 0519 07/28/16 0448  WBC 9.6 8.6 8.1 12.1* 10.5  NEUTROABS  --   --  7.1 8.9* 7.6  HGB 8.2* 7.7* 8.2* 7.9* 7.9*  HCT 25.3* 23.2* 24.8* 24.3* 24.6*  MCV 88.5 88.2 87.9 92.7 93.5  PLT 391 421* 414* 463* 438*   Basic Metabolic Panel:  Recent Labs Lab 07/24/16 0634 07/25/16 0507 07/26/16 0540 07/27/16 0519 07/28/16 0448 07/28/16 1243  NA 131* 133* 137 135 139 137  K 6.1* 5.5* 4.4 3.8 4.1 5.0  CL 93* 92* 96* 96* 101 102  CO2 23 24 28 25 22  19*  GLUCOSE 113* 97 86 86 90 90  BUN 64* 96* 59* 85* 100* 103*  CREATININE 9.07* 11.63* 7.92* 10.08* 10.78* 10.46*  CALCIUM 9.2 9.6 9.4 9.5 9.5 9.1  MG 2.5*  --   --   --   --   --   PHOS 9.1* 9.7* 7.6* 7.5* 7.7*  --    GFR: Estimated Creatinine Clearance: 13.7 mL/min (A) (by C-G formula based on SCr of 10.46 mg/dL (H)). Liver  Function Tests:  Recent Labs Lab 07/24/16 0981 07/25/16 0507 07/26/16 0540 07/27/16 0519 07/28/16 0448  AST 24  --   --   --   --   ALT 35  --   --   --   --   ALKPHOS 56  --   --   --   --   BILITOT 0.7  --   --   --   --   PROT 6.9  --   --   --   --   ALBUMIN 3.1* 3.1* 3.1* 3.1* 3.1*   No results for input(s): LIPASE, AMYLASE in the last 168 hours. No results for input(s): AMMONIA in the last 168 hours. Coagulation Profile: No results for input(s): INR, PROTIME in the last 168 hours. Cardiac Enzymes: No  results for input(s): CKTOTAL, CKMB, CKMBINDEX, TROPONINI in the last 168 hours. BNP (last 3 results) No results for input(s): PROBNP in the last 8760 hours. HbA1C: No results for input(s): HGBA1C in the last 72 hours. CBG: No results for input(s): GLUCAP in the last 168 hours. Lipid Profile: No results for input(s): CHOL, HDL, LDLCALC, TRIG, CHOLHDL, LDLDIRECT in the last 72 hours. Thyroid Function Tests: No results for input(s): TSH, T4TOTAL, FREET4, T3FREE, THYROIDAB in the last 72 hours. Anemia Panel: No results for input(s): VITAMINB12, FOLATE, FERRITIN, TIBC, IRON, RETICCTPCT in the last 72 hours. Urine analysis: No results found for: COLORURINE, APPEARANCEUR, LABSPEC, PHURINE, GLUCOSEU, HGBUR, BILIRUBINUR, KETONESUR, PROTEINUR, UROBILINOGEN, NITRITE, LEUKOCYTESUR Sepsis Labs: @LABRCNTIP (procalcitonin:4,lacticidven:4)  )No results found for this or any previous visit (from the past 240 hour(s)).    Radiology Studies: No results found.  Scheduled Meds: . darbepoetin (ARANESP) injection - DIALYSIS  150 mcg Intravenous Q Thu-HD  . docusate sodium  100 mg Oral BID  . feeding supplement (NEPRO CARB STEADY)  237 mL Oral BID BM  . ferrous sulfate  325 mg Oral BID WC  . heparin  5,000 Units Subcutaneous Q8H  . methadone  10 mg Oral Daily  . neomycin-bacitracin-polymyxin   Topical BID  . sevelamer carbonate  1,600 mg Oral TID WC  . sodium chloride flush  10-40 mL  Intracatheter Q12H   Continuous Infusions: . sodium chloride 175 mL/hr at 07/28/16 1228  . ferric gluconate (FERRLECIT/NULECIT) IV Stopped (07/25/16 1135)     LOS: 19 days    Time spent: 15   Pleas Koch, MD [facility]   If 7PM-7AM, please contact night-coverage [email or web address] Password [facility] 07/28/2016, 4:49 PM

## 2016-07-29 MED ORDER — DARBEPOETIN ALFA 150 MCG/0.3ML IJ SOSY
150.0000 ug | PREFILLED_SYRINGE | INTRAMUSCULAR | Status: DC
  Administered 2016-07-30: 150 ug via SUBCUTANEOUS
  Filled 2016-07-29 (×2): qty 0.3

## 2016-07-29 MED ORDER — SODIUM CHLORIDE 0.9 % IV SOLN
125.0000 mg | Freq: Every day | INTRAVENOUS | Status: AC
  Administered 2016-07-29 – 2016-08-02 (×5): 125 mg via INTRAVENOUS
  Filled 2016-07-29 (×9): qty 10

## 2016-07-29 NOTE — Progress Notes (Signed)
Patient ID: Azzan Butler, male   DOB: [birth year], 21 y.o.   MRN: [id number]                                                                PROGRESS NOTE                                                                                                                                                                                                             Patient Demographics:    Chrystian Cupples, is a 21 y.o. male, DOB - 1995-09-26, YQM:[id number]  Admit date - 07/09/2016   Admitting Physician Kalman Shan, MD  Outpatient Primary MD for the patient is Patient, No Pcp Per  LOS - 20  Outpatient Specialists:     No chief complaint on file.      Brief Narrative  21 ? Allegedly drug user and dealer [see psych Md note 6/20] Admit by PCCM from [place] with OD, Fentanyl/morphine Intubated for airway protxn Noted  Mild hypotension on admission, positive troponin, pinpoint pupils, hyperkalemia and oliguria Narcan unrespnsive Lactate 10.4, SCr 4.4, K 7.1, AST 1453, ALT 1008, AG 23, WBC 18.9, Hgb 17.8, Trop 4.48. UDS positive for opiates  Initially admitted and needed CRRT transitioning to intermittent dialysis Hospital course complicated by self discontinuation of Foley catheter causing urethral trauma necessitating urgent urological intervention and a coud catheter placement Has not yet and deemed end-stage renal disease and it appears his renal function might slowly be improving Disposition is still unclear   Subjective:    Berenice Bouton today had HD held yesterday. Pt awaiting placement, ? LTAC  No headache, No chest pain, No abdominal pain - No Nausea, No new weakness tingling or numbness, No Cough - SOB.    Assessment  & Plan :    Principal Problem:   Drug overdose Active Problems:   AKI (acute kidney injury) (HCC)   Acute respiratory failure with hypoxia (HCC)   Urethral bleeding   Illicit Drug overdose - intent unclear  Pt now more alert and lucid  Psychiatry has seen and  cleared him from suicidality perspective   Acute resp failure wi and pharmacy to th hypoxia Resolved -due to OD   Urethral trauma  Repeat urethral trauma 6/19 probably secondary to heparinization in dialysis Nursing has been instructed  to to get strict I's and O's on the patient, this is sometimes limited by patient's resistance to catheter flushes Patient self DC'd Foley initially 6/11 and needed urgent urological intervention and coud catheter placement At dilaysis on 6/19 experienced pain and found to be in pool of blood-this seems to have ceased             Appreciate Dr. Berneice Heinrich input, felt 2/2 heparin c Dialysis?                    urine with clots 6/21, but seems to have resolved 6/25             US renal 6/10 rules out hydronephrosis as per Dr. Mena Goes, appreciate input             plan for at least 4 wksper urology -do NOTremove Foley catheter  Acute oliguric kidney injury secondary to rhabdomyolysis on CRRT in the ICU not yet declared ESRD Orthostatic hypotension noted 6/25, ? 2/2 to ABLA vs vol depletion from dialysis uncertain whether he will need ongoing dialysis--Last dialysis 6/23, nephrology seems to think he may not need long-term dialysis which would be good given his drug history. He is now passing ~ 1,200 liter sof urine daily-??atn in post-oliguric phase Given IVF today 6/25 per nephro--orthostatics ++ with lows in 60 systolic--not septic             I'm not sure if he is a candidate for CIR anymore and will ask therapy to see him and may potentially be a candidate for LTAC??  Await CM and Sw input  Shock Liver / Transaminitis Due to above - resolved   Acute blood loss due to urethral trauma See above Hemoglobin currently 8.2--->7.9 Might transfuse 6/26 am if hemoglobin any lower, type ans screened 6.25 iron studies 6/21 showed iron of 36 and saturation of 11 receiving EPO/IV iron per nephro Start iron feso4 325 bid   Acute encephalopathy  due to  intentionaldrug overdose + sedation(Heroin and Fentanyl) - methadone 10 mg daily Have discontinued IV morphine, use roxanol for foley flushes Have discontinued psychotropics Patient cleared from this perspective of from psychiatry and now await medical disposition in terms of renal insufficiency  R>L hearing loss Etiology unclear-Hypotension, ?Precedex? Propofol D/w Dr. 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     Code Status : FULL CODE  Family Communication  : w/ patient  Disposition Plan  : awaiting LTAC  Barriers For Discharge :   Consults  :  Nephrology, pccm  Procedures  :   DVT Prophylaxis  :  Heparin - SCDs   Lab Results  Component Value Date   PLT 438 (H) 07/28/2016    Antibiotics  :    Anti-infectives    Start     Dose/Rate Route Frequency Ordered Stop   07/20/16 1432  vancomycin (VANCOCIN) 1-5 GM/200ML-% IVPB    Comments:  Georgette Shell   : cabinet override      07/20/16 1432 07/20/16 1553   07/20/16 1300  vancomycin (VANCOCIN) IVPB 1000 mg/200 mL premix     1,000 mg 200 mL/hr over 60  Minutes Intravenous To ShortStay Surgical 07/20/16 1256 07/20/16 1540   07/10/16 1200  cefTRIAXone (ROCEPHIN) 1 g in dextrose 5 % 50 mL IVPB     1 g 100 mL/hr over 30 Minutes Intravenous Every 24 hours 07/10/16 1136 07/16/16 1240        Objective:   Vitals:   07/28/16 0905 07/28/16 1703 07/28/16 2357 07/29/16 0430  BP: 125/63 (!) 120/53 124/67   Pulse: 74 60 63   Resp: 17 18 18    Temp: 98 F (36.7 C) 98 F (36.7 C) 98.1 F (36.7 C)   TempSrc: Oral Oral Oral   SpO2: 99% 100% 100%   Weight:   97.5  kg (215 lb) 97.5 kg (214 lb 15.2 oz)  Height:   5\' 11"  (1.803 m)     Wt Readings from Last 3 Encounters:  07/29/16 97.5 kg (214 lb 15.2 oz)     Intake/Output Summary (Last 24 hours) at 07/29/16 0557 Last data filed at 07/29/16 0224  Gross per 24 hour  Intake          2923.25 ml  Output             3650 ml  Net          -726.75 ml     Physical Exam  Awake Alert, Oriented X 3, No new F.N deficits, Normal affect Sharkey.AT,PERRAL Supple Neck,No JVD, No cervical lymphadenopathy appriciated.  Symmetrical Chest wall movement, Good air movement bilaterally, CTAB RRR,No Gallops,Rubs or new Murmurs, No Parasternal Heave +ve B.Sounds, Abd Soft, No tenderness, No organomegaly appriciated, No rebound - guarding or rigidity. No Cyanosis, Clubbing or edema, No new Rash or bruise   Foley in place    Data Review:    CBC  Recent Labs Lab 07/23/16 0449 07/24/16 0634 07/27/16 0519 07/28/16 0448  WBC 8.6 8.1 12.1* 10.5  HGB 7.7* 8.2* 7.9* 7.9*  HCT 23.2* 24.8* 24.3* 24.6*  PLT 421* 414* 463* 438*  MCV 88.2 87.9 92.7 93.5  MCH 29.3 29.1 30.2 30.0  MCHC 33.2 33.1 32.5 32.1  RDW 14.5 14.2 14.6 15.0  LYMPHSABS  --  0.7 2.3 2.1  MONOABS  --  0.3 0.7 0.5  EOSABS  --  0.0 0.2 0.3  BASOSABS  --  0.0 0.0 0.1    Chemistries   Recent Labs Lab 07/24/16 0634 07/25/16 0507 07/26/16 0540 07/27/16 0519 07/28/16 0448 07/28/16 1243  NA 131* 133* 137 135 139 137  K 6.1* 5.5* 4.4 3.8 4.1 5.0  CL 93* 92* 96* 96* 101 102  CO2 23 24 28 25 22  19*  GLUCOSE 113* 97 86 86 90 90  BUN 64* 96* 59* 85* 100* 103*  CREATININE 9.07* 11.63* 7.92* 10.08* 10.78* 10.46*  CALCIUM 9.2 9.6 9.4 9.5 9.5 9.1  MG 2.5*  --   --   --   --   --   AST 24  --   --   --   --   --   ALT 35  --   --   --   --   --   ALKPHOS 56  --   --   --   --   --   BILITOT 0.7  --   --   --   --   --    ------------------------------------------------------------------------------------------------------------------ No results  for input(s): CHOL, HDL, LDLCALC, TRIG, CHOLHDL, LDLDIRECT in the last 72 hours.  No results found for: HGBA1C ------------------------------------------------------------------------------------------------------------------ No results for input(s): TSH, T4TOTAL, T3FREE,  THYROIDAB in the last 72 hours.  Invalid input(s): FREET3 ------------------------------------------------------------------------------------------------------------------ No results for input(s): VITAMINB12, FOLATE, FERRITIN, TIBC, IRON, RETICCTPCT in the last 72 hours.  Coagulation profile No results for input(s): INR, PROTIME in the last 168 hours.  No results for input(s): DDIMER in the last 72 hours.  Cardiac Enzymes No results for input(s): CKMB, TROPONINI, MYOGLOBIN in the last 168 hours.  Invalid input(s): CK ------------------------------------------------------------------------------------------------------------------ No results found for: BNP  Inpatient Medications  Scheduled Meds: . darbepoetin (ARANESP) injection - DIALYSIS  150 mcg Intravenous Q Thu-HD  . docusate sodium  100 mg Oral BID  . feeding supplement (NEPRO CARB STEADY)  237 mL Oral BID BM  . ferrous sulfate  325 mg Oral BID WC  . heparin  5,000 Units Subcutaneous Q8H  . methadone  10 mg Oral Daily  . neomycin-bacitracin-polymyxin   Topical BID  . sevelamer carbonate  1,600 mg Oral TID WC  . sodium chloride flush  10-40 mL Intracatheter Q12H   Continuous Infusions: . sodium chloride 175 mL/hr at 07/29/16 0357  . ferric gluconate (FERRLECIT/NULECIT) IV Stopped (07/25/16 1135)   PRN Meds:.lidocaine (PF), lidocaine-prilocaine, morphine CONCENTRATE, pentafluoroprop-tetrafluoroeth  Micro Results No results found for this or any previous visit (from the past 240 hour(s)).  Radiology Reports US Renal  Result Date: 07/23/2016 CLINICAL DATA:  Subacute onset of renal insufficiency, and hematuria. Initial encounter. EXAM: RENAL / URINARY  TRACT ULTRASOUND COMPLETE COMPARISON:  Renal ultrasound performed 07/09/2016 FINDINGS: Right Kidney: Length: 15.3 cm. Echogenicity within normal limits. No mass or hydronephrosis visualized. Left Kidney: Length: 14.9 cm. Mildly increased renal parenchymal echogenicity is noted. No mass or hydronephrosis visualized. Bladder: Decompressed, with a Foley catheter in place. IMPRESSION: 1. No evidence of hydronephrosis. 2. Mildly increased renal parenchymal echogenicity may reflect medical renal disease. 3. Bladder decompressed, with a Foley catheter in place. No significant blood clot seen within the bladder, though evaluation of the bladder is limited. Electronically Signed   By: Roanna Raider M.D.   On: 07/23/2016 18:42   US Renal  Result Date: 07/09/2016 CLINICAL DATA:  Acute kidney injury EXAM: RENAL / URINARY TRACT ULTRASOUND COMPLETE COMPARISON:  None. FINDINGS: Right Kidney: Length: 12.8 cm. Increased renal cortical echogenicity. No mass or hydronephrosis visualized. Left Kidney: Length: 12.9 cm. Increased renal cortical echogenicity. No mass or hydronephrosis visualized. Bladder: Decompressed bladder with a Foley catheter present. IMPRESSION: 1. No obstructive uropathy. 2. Increased renal cortical echogenicity as can be seen with acute renal failure. Electronically Signed   By: Elige Ko   On: 07/09/2016 12:03   Ir Fluoro Guide Cv Line Right  Result Date: 07/20/2016 INDICATION: Renal failure EXAM: TUNNELED DIALYSIS CATHETER PLACEMENT, ULTRASOUND GUIDANCE FOR VASCULAR ACCESS MEDICATIONS: Ancef; The antibiotic was administered within an appropriate time interval prior to skin puncture. ANESTHESIA/SEDATION: Versed 4 mg IV; Fentanyl 100 mcg IV; Moderate Sedation Time:  20 The patient was continuously monitored during the procedure by the interventional radiology nurse under my direct supervision. FLUOROSCOPY TIME:  Fluoroscopy Time:  minutes 42 seconds (3 mGy). COMPLICATIONS: None immediate. PROCEDURE:  Informed written consent was obtained from the patient after a thorough discussion of the procedural risks, benefits and alternatives. All questions were addressed. Maximal Sterile Barrier Technique was utilized including caps, mask, sterile gowns, sterile gloves, sterile drape, hand hygiene and skin antiseptic. A timeout was performed prior to the initiation of the procedure. The right neck was prepped with ChloraPrep in a sterile fashion, and a sterile drape was applied covering the operative field.  A sterile gown and sterile gloves were used for the procedure. 1% lidocaine into the skin and subcutaneous tissue. The jugular vein was noted to be patent initially with ultrasound. Under sonographic guidance, a micropuncture needle was inserted into the right IJ vein (Ultrasound and fluoroscopic image documentation was performed). It was removed over an 018 wire which was up-sized to an Amplatz. This was advanced into the IVC. A small incision was made in the right upper chest. The tunneling device was utilized to advance the 23 centimeter tip to cuff catheter from the chest incision and out the neck incision. A peel-away sheath was advanced over the Amplatz wire. The leading edge of the catheter was then advanced through the peel-away sheath. The peel-away sheath was removed. It was flushed and instilled with heparin. The chest incision was closed with a 0 Prolene pursestring stitch. The neck incision was closed with a 4-0 Vicryl subcuticular stitch. IMPRESSION: Successful right IJ vein tunneled dialysis catheter with its tip in the right atrium. Electronically Signed   By: Jolaine Click M.D.   On: 07/20/2016 15:17   Ir US Guide Vasc Access Right  Result Date: 07/20/2016 INDICATION: Renal failure EXAM: TUNNELED DIALYSIS CATHETER PLACEMENT, ULTRASOUND GUIDANCE FOR VASCULAR ACCESS MEDICATIONS: Ancef; The antibiotic was administered within an appropriate time interval prior to skin puncture. ANESTHESIA/SEDATION:  Versed 4 mg IV; Fentanyl 100 mcg IV; Moderate Sedation Time:  20 The patient was continuously monitored during the procedure by the interventional radiology nurse under my direct supervision. FLUOROSCOPY TIME:  Fluoroscopy Time:  minutes 42 seconds (3 mGy). COMPLICATIONS: None immediate. PROCEDURE: Informed written consent was obtained from the patient after a thorough discussion of the procedural risks, benefits and alternatives. All questions were addressed. Maximal Sterile Barrier Technique was utilized including caps, mask, sterile gowns, sterile gloves, sterile drape, hand hygiene and skin antiseptic. A timeout was performed prior to the initiation of the procedure. The right neck was prepped with ChloraPrep in a sterile fashion, and a sterile drape was applied covering the operative field. A sterile gown and sterile gloves were used for the procedure. 1% lidocaine into the skin and subcutaneous tissue. The jugular vein was noted to be patent initially with ultrasound. Under sonographic guidance, a micropuncture needle was inserted into the right IJ vein (Ultrasound and fluoroscopic image documentation was performed). It was removed over an 018 wire which was up-sized to an Amplatz. This was advanced into the IVC. A small incision was made in the right upper chest. The tunneling device was utilized to advance the 23 centimeter tip to cuff catheter from the chest incision and out the neck incision. A peel-away sheath was advanced over the Amplatz wire. The leading edge of the catheter was then advanced through the peel-away sheath. The peel-away sheath was removed. It was flushed and instilled with heparin. The chest incision was closed with a 0 Prolene pursestring stitch. The neck incision was closed with a 4-0 Vicryl subcuticular stitch. IMPRESSION: Successful right IJ vein tunneled dialysis catheter with its tip in the right atrium. Electronically Signed   By: Jolaine Click M.D.   On: 07/20/2016 15:17   Dg  Chest Port 1 View  Result Date: 07/14/2016 CLINICAL DATA:  Central line placement EXAM: PORTABLE CHEST 1 VIEW COMPARISON:  07/12/2016 FINDINGS: Endotracheal tube has been removed. Left-sided central venous catheter tip overlies the distal SVC. Right sided central venous catheter tip also overlies SVC. Esophageal tube has been removed. Mildly low lung volumes. No focal consolidation or effusion.  Normal heart size. No pneumothorax. IMPRESSION: Removal of endotracheal and esophageal tubes. Tips of the central venous catheters overlie the SVC. Low lung volume but no pneumothorax or infiltrate. Electronically Signed   By: Jasmine PangKim  Fujinaga M.D.   On: 07/14/2016 00:21   Dg Chest Port 1 View  Result Date: 07/12/2016 CLINICAL DATA:  Acute respiratory failure EXAM: PORTABLE CHEST 1 VIEW COMPARISON:  07/11/2016 and prior radiographs FINDINGS: An endotracheal tube with tip 4 cm above the carina, NG tube entering stomach with tip off the field of view, right central venous catheter with tip overlying the mid SVC and left IJ central venous catheter with tip overlying the lower SVC again noted. This is a low volume film with mild bibasilar atelectasis again noted. There is no evidence of pneumothorax or definite pleural effusion. IMPRESSION: Unchanged appearance of the chest. Electronically Signed   By: Harmon PierJeffrey  Hu M.D.   On: 07/12/2016 07:11   Dg Chest Port 1 View  Result Date: 07/11/2016 CLINICAL DATA:  Hypoxia EXAM: PORTABLE CHEST 1 VIEW COMPARISON:  July 10, 2016 FINDINGS: Endotracheal tube tip is 4.1 cm above the carina. The left jugular catheter tip is at the cavoatrial junction. Right jugular catheter tip is in the superior cava near the cavoatrial junction. Nasogastric tube tip and side port are below the diaphragm. No pneumothorax. There is mild atelectasis in the right base. Lungs elsewhere clear. Heart size and pulmonary vascularity are normal. No adenopathy. No bone lesions. IMPRESSION: Tube and catheter positions  as described without evident pneumothorax. Right base atelectasis. Lungs elsewhere clear. Stable cardiac silhouette. Electronically Signed   By: Bretta BangWilliam  Woodruff III M.D.   On: 07/11/2016 07:04   Dg Chest Port 1 View  Result Date: 07/10/2016 CLINICAL DATA:  Respiratory failure. EXAM: PORTABLE CHEST 1 VIEW COMPARISON:  07/09/2016 . FINDINGS: Endotracheal tube, NG tube, bilateral IJ lines in stable position. Heart size normal. Low lung volumes with mild basilar atelectasis. No change from prior exam. No pleural effusion or pneumothorax. IMPRESSION: 1. Lines and tubes stable position. 2. Low lung volumes with mild basilar atelectasis. No change from prior exam. Electronically Signed   By: Maisie Fushomas  Register   On: 07/10/2016 06:43   Dg Chest Port 1 View  Result Date: 07/09/2016 CLINICAL DATA:  Status post left catheter placement. EXAM: PORTABLE CHEST 1 VIEW COMPARISON:  Radiograph of July 09, 2016. FINDINGS: Stable cardiomediastinal silhouette. Endotracheal and orogastric tubes are unchanged in position. Right internal jugular catheter is unchanged. Mild bibasilar subsegmental atelectasis is now noted. Interval placement of left internal jugular dialysis catheter with distal tip in expected position of cavoatrial junction. No pneumothorax or pleural effusion is noted. IMPRESSION: Mild bibasilar subsegmental atelectasis. Interval placement of left internal jugular dialysis catheter with distal tip in expected position of cavoatrial junction. No pneumothorax is noted. Electronically Signed   By: Lupita RaiderJames  Green Jr, M.D.   On: 07/09/2016 12:28   Dg Chest Port 1 View  Result Date: 07/09/2016 CLINICAL DATA:  Endotracheal tube and central line placement. EXAM: PORTABLE CHEST 1 VIEW COMPARISON:  None. FINDINGS: The heart size and mediastinal contours are within normal limits. No pneumothorax or pleural effusion is noted. Minimal right basilar subsegmental atelectasis is noted. Right internal jugular catheter is noted with  distal tip in expected position of the SVC. Endotracheal tube is projected over tracheal air shadow with distal tip 6 cm above the carina. Orogastric tube tip is seen in proximal stomach. The visualized skeletal structures are unremarkable. IMPRESSION: Endotracheal tube, orogastric  tube and right internal jugular catheter in grossly good position. No pneumothorax is noted. Minimal right basilar subsegmental atelectasis. Electronically Signed   By: Lupita Raider, M.D.   On: 07/09/2016 08:44    Time Spent in minutes  30   Pearson Grippe M.D on 07/29/2016 at 5:57 AM  Between 7am to 7pm - Pager - [phone]  After 7pm go to [email or web address] - password [facility]  [facility] -  Office  [phone]

## 2016-07-29 NOTE — Progress Notes (Signed)
Urology Inpatient Progress Report  OVERDOSE SEPSIS PNEUMONIA    Intv/Subj: No acute events overnight. Seems to be  Tolerating the catheter at this point.   Foley has not been irrigated in a few days, the urine remains blood tinged, the bleeding from around the catheter has been minimal.  Principal Problem:   Drug overdose Active Problems:   AKI (acute kidney injury) (HCC)   Acute respiratory failure (HCC)   Urethral bleeding  Current Facility-Administered Medications  Medication Dose Route Frequency Provider Last Rate Last Dose  . 0.9 %  sodium chloride infusion   Intravenous Continuous Lauris PoagPowell, Alvin C, MD 175 mL/hr at 07/29/16 0357    . Darbepoetin Alfa (ARANESP) injection 150 mcg  150 mcg Intravenous Q Thu-HD Bufford ButtnerUpton, Elizabeth, MD   150 mcg at 07/23/16 1033  . docusate sodium (COLACE) capsule 100 mg  100 mg Oral BID Oretha MilchAlva, Rakesh V, MD   100 mg at 07/28/16 2223  . feeding supplement (NEPRO CARB STEADY) liquid 237 mL  237 mL Oral BID BM Rhetta MuraSamtani, Jai-Gurmukh, MD   237 mL at 07/28/16 1408  . ferric gluconate (NULECIT) 125 mg in sodium chloride 0.9 % 100 mL IVPB  125 mg Intravenous Q T,Th,Sa-HD Bufford ButtnerUpton, Elizabeth, MD   Stopped at 07/25/16 1135  . ferrous sulfate tablet 325 mg  325 mg Oral BID WC Rhetta MuraSamtani, Jai-Gurmukh, MD   325 mg at 07/28/16 1843  . heparin injection 5,000 Units  5,000 Units Subcutaneous Q8H Barnetta Chapelsborne, Kelly, PA-C   5,000 Units at 07/27/16 0559  . lidocaine (PF) (XYLOCAINE) 1 % injection 5 mL  5 mL Intradermal PRN Deterding, Fayrene FearingJames, MD      . lidocaine-prilocaine (EMLA) cream 1 application  1 application Topical PRN Deterding, Fayrene FearingJames, MD      . methadone (DOLOPHINE) tablet 10 mg  10 mg Oral Daily Oretha MilchAlva, Rakesh V, MD   10 mg at 07/28/16 1048  . morphine CONCENTRATE 10 MG/0.5ML oral solution 5 mg  5 mg Oral Q4H PRN Rhetta MuraSamtani, Jai-Gurmukh, MD   5 mg at 07/24/16 2027  . neomycin-bacitracin-polymyxin (NEOSPORIN) ointment   Topical BID Jerilee FieldEskridge, Matthew, MD      .  pentafluoroprop-tetrafluoroeth Peggye Pitt(GEBAUERS) aerosol 1 application  1 application Topical PRN Deterding, Fayrene FearingJames, MD      . sevelamer carbonate (RENVELA) tablet 1,600 mg  1,600 mg Oral TID WC Bufford ButtnerUpton, Elizabeth, MD   1,600 mg at 07/28/16 1843  . sodium chloride flush (NS) 0.9 % injection 10-40 mL  10-40 mL Intracatheter Q12H Desai, Rahul P, PA-C   10 mL at 07/28/16 2224     Objective: Vital: Vitals:   07/28/16 1703 07/28/16 2357 07/29/16 0430 07/29/16 0633  BP: (!) 120/53 124/67  125/62  Pulse: 60 63  (!) 59  Resp: 18 18  18   Temp: 98 F (36.7 C) 98.1 F (36.7 C)  98.1 F (36.7 C)  TempSrc: Oral Oral  Oral  SpO2: 100% 100%  100%  Weight:  97.5 kg (215 lb) 97.5 kg (214 lb 15.2 oz)   Height:  5\' 11"  (1.803 m)     I/Os: I/O last 3 completed shifts: In: 4083.3 [P.O.:1012; I.V.:3071.3] Out: 4350 [Urine:4350]  Physical Exam:  General: Patient is in no apparent distress Lungs: Normal respiratory effort, chest expands symmetrically. Foley catheter in place, blood at meatus, catheter draining blood tinged urine.  Lab Results:  Recent Labs  07/27/16 0519 07/28/16 0448  WBC 12.1* 10.5  HGB 7.9* 7.9*  HCT 24.3* 24.6*    Recent Labs  07/27/16 0519 07/28/16 0448 07/28/16 1243  NA 135 139 137  K 3.8 4.1 5.0  CL 96* 101 102  CO2 25 22 19*  GLUCOSE 86 90 90  BUN 85* 100* 103*  CREATININE 10.08* 10.78* 10.46*  CALCIUM 9.5 9.5 9.1   No results for input(s): LABPT, INR in the last 72 hours. No results for input(s): LABURIN in the last 72 hours. Results for orders placed or performed during the hospital encounter of 07/09/16  MRSA PCR Screening     Status: None   Collection Time: 07/09/16  8:32 AM  Result Value Ref Range Status   MRSA by PCR NEGATIVE NEGATIVE Final    Comment:        The GeneXpert MRSA Assay (FDA approved for NASAL specimens only), is one component of a comprehensive MRSA colonization surveillance program. It is not intended to diagnose MRSA infection nor  to guide or monitor treatment for MRSA infections.   Culture, blood (routine x 2)     Status: None   Collection Time: 07/09/16  9:09 AM  Result Value Ref Range Status   Specimen Description BLOOD RIGHT HAND  Final   Special Requests IN PEDIATRIC BOTTLE Blood Culture adequate volume  Final   Culture NO GROWTH 5 DAYS  Final   Report Status 07/14/2016 FINAL  Final  Culture, blood (routine x 2)     Status: None   Collection Time: 07/09/16  9:09 AM  Result Value Ref Range Status   Specimen Description BLOOD RIGHT HAND  Final   Special Requests IN PEDIATRIC BOTTLE Blood Culture adequate volume  Final   Culture NO GROWTH 5 DAYS  Final   Report Status 07/14/2016 FINAL  Final    Studies/Results: No results found.  Assessment: Urethral trauma due to self-extraction of hte catheter, balloon intact.  A foley is now in place and he is tolerating it.    Plan: I spoke at length with the patient about the extent of his trauma and the importance of leaving the catheter in for 4 weeks.  I think this gives him the best shot at not need reconstruction and not needing to be replaced.  He seemed to understand and was positive about the situation.  I will schedule a voiding trial in our office in two weeks.  Please contact urology for further questions, we will follow from afar.   Berniece Salines, MD Urology 07/29/2016, 7:58 AM

## 2016-07-29 NOTE — Progress Notes (Signed)
PT Cancellation Note  Patient Details Name: Blake BoutonLindon Nest MRN: [id number] DOB: Dec 17, 1995   Cancelled Treatment:    Reason Eval/Treat Not Completed: Patient declined, no reason specified.  Cx by pt with no reason but has plan to DC to [facility] today if possible.  Will try later if time allows.   Ivar DrapeRuth E Elva Mauro 07/29/2016, 11:49 AM   Samul Dadauth Camreigh Michie, PT MS Acute Rehab Dept. Number: [facility] [phone] and [facility] [phone]

## 2016-07-29 NOTE — Progress Notes (Signed)
Nutrition Follow-up  DOCUMENTATION CODES:   Obesity unspecified  INTERVENTION:   -Continue Nepro BID -Reviewed current dialysis diet restrictions with pt, pt receptive. Further education to follow if pt requires outpatient HD -Add snacks (high protein) between meals -If UOP remains adequate, recommend removal of fluid restriction   NUTRITION DIAGNOSIS:   Increased nutrient needs related to acute illness as evidenced by estimated needs.  Continues but being addressed via supplements, snacks  GOAL:   Patient will meet greater than or equal to 90% of their needs  Progressing  MONITOR:   PO intake, Supplement acceptance, Labs, I & O's  REASON FOR ASSESSMENT:   Consult, Ventilator Assessment of nutrition requirement/status  ASSESSMENT:   Pt with no know PMH admitted after presumed heroine and fentanyl OD possibly down for 10 hours. Intubated with rhabdo, hyperkalemia, AKI.   Pt reports appetite is fair, reports eating around 50% of meals, sometimes drinks Nepro shake. Pt reports he has been eating a lot of peaches (ate peaches for dinner, only ate the peaches from his renal bedtime snack). Pt reports that is the only thing that sounds good to him  Phosphorus improving, last HD 6/23. Noted disposition still unclear, unsure if pt will require outpatient HD. HD on 6/26 actually held, pt receiving IVF for volume expansion, UOP 3.75L of fluid in past 24 hours.   Labs: phosphorus 7.7 , potassium 5.0 Meds: NS at 100 ml/hr (for 30 hours), renvela with meals  Diet Order:  Diet renal with fluid restriction Fluid restriction: 1200 mL Fluid; Room service appropriate? Yes; Fluid consistency: Thin  Skin:  Reviewed, no issues  Last BM:  07/27/16  Height:   Ht Readings from Last 1 Encounters:  07/28/16 5\' 11"  (1.803 m)    Weight:   Wt Readings from Last 1 Encounters:  07/29/16 214 lb 15.2 oz (97.5 kg)    Ideal Body Weight:  80.9 kg  BMI:  Body mass index is 29.98  kg/m.  Estimated Nutritional Needs:   Kcal:  2200-2400  Protein:  100-115 gm  Fluid:  per MD  EDUCATION NEEDS:   No education needs identified at this time  Romelle StarcherCate Kayen Grabel MS, RD, LDN [phone] Pager  [phone] Weekend/On-Call Pager

## 2016-07-29 NOTE — Progress Notes (Signed)
Spoke with patient at bedside. Discussed potential discharge to [facility], patient is agreeable. Candace [phone] running insurance. If approved there is a bed available today. Patient aware that there is a possibility he could DC to [facility] today. CM offered to explain to family, patient declined offer. Will notify Dr Selena BattenKim, attending, if transfer approved for today.

## 2016-07-29 NOTE — Progress Notes (Signed)
Assessment/Plan: 1  Acute non-oliguric kidney injury: secondary to rhabdo. He previously required CRRT and has transitioned to IHD, last treatment 6/23.He has a TDC now. Good UOP with IVFs.  Will reduce rate.  Renal fct appears to have plateaud.  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 Urethral trauma: per GU needs a Foley catheter for 4 weeks  Subjective: Interval History: does not sound committed to any "recovery"  Objective: Vital signs in last 24 hours: Temp:  [98 F (36.7 C)-98.1 F (36.7 C)] 98.1 F (36.7 C) (06/27 40980633) Pulse Rate:  [59-63] 59 (06/27 0633) Resp:  [18] 18 (06/27 0633) BP: (120-125)/(53-67) 125/62 (06/27 0633) SpO2:  [100 %] 100 % (06/27 11910633) Weight:  [97.5 kg (214 lb 15.2 oz)-97.5 kg (215 lb)] 97.5 kg (214 lb 15.2 oz) (06/27 0430) Weight change: -1.361 kg (-3 lb)  Intake/Output from previous day: 06/26 0701 - 06/27 0700 In: 3863.3 [P.O.:792; I.V.:3071.3] Out: 3750 [Urine:3750] Intake/Output this shift: No intake/output data recorded.  General appearance: alert and cooperative Resp: clear to auscultation bilaterally Cardio: regular rate and rhythm, S1, S2 normal, no murmur, click, rub or gallop Extremities: extremities normal, atraumatic, no cyanosis or edema  Lab Results:  Recent Labs  07/27/16 0519 07/28/16 0448  WBC 12.1* 10.5  HGB 7.9* 7.9*  HCT 24.3* 24.6*  PLT 463* 438*   BMET:  Recent Labs  07/28/16 0448 07/28/16 1243  NA 139 137  K 4.1 5.0  CL 101 102  CO2 22 19*  GLUCOSE 90 90  BUN 100* 103*  CREATININE 10.78* 10.46*  CALCIUM 9.5 9.1   No results for input(s): PTH in the last 72 hours. Iron Studies: No results for input(s): IRON, TIBC, TRANSFERRIN, FERRITIN in the last 72 hours. Studies/Results: No results found.  Scheduled: . [START ON 07/30/2016] darbepoetin (ARANESP) injection - NON-DIALYSIS  150 mcg Subcutaneous Q Thu-1800  . docusate sodium  100 mg Oral BID  . feeding supplement  (NEPRO CARB STEADY)  237 mL Oral BID BM  . heparin  5,000 Units Subcutaneous Q8H  . methadone  10 mg Oral Daily  . neomycin-bacitracin-polymyxin   Topical BID  . sevelamer carbonate  1,600 mg Oral TID WC  . sodium chloride flush  10-40 mL Intracatheter Q12H    LOS: 20 days   Blake Kramer C 07/29/2016,9:37 AM

## 2016-07-30 LAB — COMPREHENSIVE METABOLIC PANEL
ALT: 44 U/L (ref 17–63)
ANION GAP: 10 (ref 5–15)
AST: 28 U/L (ref 15–41)
Albumin: 3.1 g/dL — ABNORMAL LOW (ref 3.5–5.0)
Alkaline Phosphatase: 83 U/L (ref 38–126)
BILIRUBIN TOTAL: 0.3 mg/dL (ref 0.3–1.2)
BUN: 86 mg/dL — AB (ref 6–20)
CO2: 21 mmol/L — ABNORMAL LOW (ref 22–32)
Calcium: 9.1 mg/dL (ref 8.9–10.3)
Chloride: 107 mmol/L (ref 101–111)
Creatinine, Ser: 7.9 mg/dL — ABNORMAL HIGH (ref 0.61–1.24)
GFR, EST AFRICAN AMERICAN: 10 mL/min — AB (ref >60)
GFR, EST NON AFRICAN AMERICAN: 9 mL/min — AB (ref >60)
Glucose, Bld: 116 mg/dL — ABNORMAL HIGH (ref 65–99)
POTASSIUM: 3.6 mmol/L (ref 3.5–5.1)
Sodium: 138 mmol/L (ref 135–145)
TOTAL PROTEIN: 6.2 g/dL — AB (ref 6.5–8.1)

## 2016-07-30 LAB — CBC
HEMATOCRIT: 24.2 % — AB (ref 39.0–52.0)
Hemoglobin: 7.6 g/dL — ABNORMAL LOW (ref 13.0–17.0)
MCH: 29.3 pg (ref 26.0–34.0)
MCHC: 31.4 g/dL (ref 30.0–36.0)
MCV: 93.4 fL (ref 78.0–100.0)
Platelets: 392 10*3/uL (ref 150–400)
RBC: 2.59 MIL/uL — AB (ref 4.22–5.81)
RDW: 15.2 % (ref 11.5–15.5)
WBC: 6.8 10*3/uL (ref 4.0–10.5)

## 2016-07-30 MED ORDER — POTASSIUM CHLORIDE 20 MEQ PO PACK
40.0000 meq | PACK | Freq: Once | ORAL | Status: AC
  Administered 2016-07-30: 40 meq via ORAL
  Filled 2016-07-30: qty 2

## 2016-07-30 MED ORDER — SODIUM CHLORIDE 0.45 % IV SOLN
INTRAVENOUS | Status: DC
  Administered 2016-07-30 – 2016-08-05 (×18): via INTRAVENOUS

## 2016-07-30 NOTE — Progress Notes (Signed)
Assessment/Plan: 1  Acute non-oliguric kidney injury: secondary to rhabdo. High output recovery.  Increase IVFs. 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 Urethral trauma: per GU needs a Foley catheter for 4 weeks  Subjective: Interval History: thirsty, 4500cc out yesterday  Objective: Vital signs in last 24 hours: Temp:  [98.2 F (36.8 Kramer)-98.7 F (37.1 Kramer)] 98.2 F (36.8 Kramer) (06/28 0800) Pulse Rate:  [60-68] 61 (06/28 0800) Resp:  [16-18] 16 (06/28 0800) BP: (120-131)/(56-71) 127/69 (06/28 0800) SpO2:  [98 %-100 %] 99 % (06/28 0800) Weight:  [98.9 kg (218 lb)-98.9 kg (218 lb 0.6 oz)] 98.9 kg (218 lb 0.6 oz) (06/28 0500) Weight change: 1.361 kg (3 lb)  Intake/Output from previous day: 06/27 0701 - 06/28 0700 In: 700 [P.O.:600; IV Piggyback:100] Out: 4525 [Urine:4525] Intake/Output this shift: Total I/O In: 363 [P.O.:360; I.V.:3] Out: 625 [Urine:625]  General appearance: alert and cooperative Resp: clear to auscultation bilaterally Chest wall: no tenderness Extremities: extremities normal, atraumatic, no cyanosis or edema  Lab Results:  Recent Labs  07/28/16 0448 07/30/16 0431  WBC 10.5 6.8  HGB 7.9* 7.6*  HCT 24.6* 24.2*  PLT 438* 392   BMET:  Recent Labs  07/28/16 1243 07/30/16 0431  NA 137 138  K 5.0 3.6  CL 102 107  CO2 19* 21*  GLUCOSE 90 116*  BUN 103* 86*  CREATININE 10.46* 7.90*  CALCIUM 9.1 9.1   No results for input(s): PTH in the last 72 hours. Iron Studies: No results for input(s): IRON, TIBC, TRANSFERRIN, FERRITIN in the last 72 hours. Studies/Results: No results found.  Scheduled: . darbepoetin (ARANESP) injection - NON-DIALYSIS  150 mcg Subcutaneous Q Thu-1800  . docusate sodium  100 mg Oral BID  . feeding supplement (NEPRO CARB STEADY)  237 mL Oral BID BM  . heparin  5,000 Units Subcutaneous Q8H  . methadone  10 mg Oral Daily  . neomycin-bacitracin-polymyxin   Topical BID  . sevelamer carbonate   1,600 mg Oral TID WC  . sodium chloride flush  10-40 mL Intracatheter Q12H     LOS: 21 days   Blake Kramer 07/30/2016,11:55 AM

## 2016-07-30 NOTE — Progress Notes (Signed)
Patient ID: Berenice BoutonLindon Bevill, male   DOB: 09/22/1995, 21 y.o.   MRN: [id number]                                                                PROGRESS NOTE                                                                                                                                                                                                             Patient Demographics:    Berenice BoutonLindon Walen, is a 21 y.o. male, DOB - 09/22/1995, ZDG:[id number]  Admit date - 07/09/2016   Admitting Physician Kalman ShanMurali Ramaswamy, MD  Outpatient Primary MD for the patient is Patient, No Pcp Per  LOS - 21  Outpatient Specialists:    No chief complaint on file.      Brief Narrative  Allegedly drug user and dealer [see psych Md note 6/20] [place], Fentanyl/morphine Intubated for airway protxn Noted Mild hypotension on admission, positive troponin, pinpoint pupils, hyperkalemia and oliguria Narcan unrespnsive Lactate 10.4, SCr 4.4, K 7.1, AST 1453, ALT 1008, AG 23, WBC 18.9, Hgb 17.8, Trop 4.48. UDS positive for opiates  Initially admitted and needed CRRT transitioning to intermittent dialysis Hospital course complicated by self discontinuation of Foley catheter causing urethral trauma necessitating urgent urological intervention and a coud catheter placement Has not yet and deemed end-stage renal disease and it appears his renal function might slowly be improving Disposition is still unclear   Subjective:    Berenice BoutonLindon Groninger today has been afebrile, doing well.  Awaiting placement. ARF, nephrology following . No headache, No chest pain, No abdominal pain - No Nausea, No new weakness tingling or numbness, No Cough - SOB.    Assessment  & Plan :    Principal Problem:   Drug overdose Active Problems:   AKI (acute kidney injury) (HCC)   Acute respiratory failure (HCC)   Urethral bleeding   Illicit Drug overdose - intent unclear  Pt now more alert and lucid  Psychiatry has seen  and cleared him from suicidality perspective   Acute resp failure wi and pharmacy to th hypoxia Resolved -due to OD   Urethral trauma  Repeat urethral trauma 6/19 probably secondary to heparinization in dialysis Nursing has been instructed to to get strict  I's and O's on the patient, this is sometimes limited by patient's resistance to catheter flushes Patient self DC'd Foley initially 6/11 and needed urgent urological intervention and coud catheter placement At dilaysis on 6/19 experienced pain and found to be in pool of blood-this seems to have ceased Appreciate Dr. Berneice Heinrich input, felt 2/2 heparin c Dialysis? urine with clots 6/21, but seems to have resolved 6/25 US renal 6/10 rules out hydronephrosis as per Dr. Mena Goes, appreciate input plan for at least 4 wksper urology -do NOTremove Foley catheter Appreciate Dr. Elon Spanner input  Anemia  From hematuria ? Cbc in am If Hgb <7 may need transfusion  Acute oliguric kidney injury secondary to rhabdomyolysis on CRRT in the ICU not yet declared ESRD Orthostatic hypotension noted 6/25, ? 2/2 to ABLA vs vol depletion from dialysis uncertain whether he will need ongoing dialysis--Last dialysis 6/23, nephrology seems to think he may not need long-term dialysis which would be good given his drug history. He is now passing ~ 1,200 liter sof urine daily-??atn in post-oliguric phase Given IVF today 6/25 per nephro--orthostatics ++ with lows in 60 systolic--not septic May potentially be a candidate for LTAC??  Appreciate nephrology input Await CM and Sw input  Shock Liver / Transaminitis Due to above - resolved   Acute blood loss due to urethral trauma See above Hemoglobin currently 8.2--->7.9 Might transfuse 6/26 am if hemoglobin any lower, type ans screened 6.25 iron studies 6/21 showed iron of 36 and saturation of 11 receiving EPO/IV iron per nephro Start iron feso4 325  bid   Acute encephalopathy  due to intentionaldrug overdose + sedation(Heroin and Fentanyl) - methadone 10 mg daily Have discontinued IV morphine, use roxanol for foley flushes Have discontinued psychotropics Patient cleared from this perspective of from psychiatry and now await medical disposition in terms of renal insufficiency  R>L hearing loss Etiology unclear-Hypotension, ?Precedex? Propofol D/w Dr. 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     Code Status : FULL CODE  Family Communication  : w/ patient  Disposition Plan  : awaiting LTAC  Barriers For Discharge :   Consults  :  Nephrology, pccm, urology  Procedures  :   DVT Prophylaxis  :  Heparin - SCDs   Lab Results  Component Value Date   PLT 392 07/30/2016     Anti-infectives    Start     Dose/Rate Route Frequency Ordered Stop   07/20/16 1432  vancomycin (VANCOCIN) 1-5 GM/200ML-% IVPB    Comments:  Georgette Shell   : cabinet override      07/20/16 1432 07/20/16 1553   07/20/16 1300  vancomycin (VANCOCIN) IVPB 1000 mg/200 mL premix     1,000 mg 200 mL/hr over 60 Minutes Intravenous To [facility] 07/20/16 1256 07/20/16 1540   07/10/16 1200  cefTRIAXone (ROCEPHIN) 1 g in dextrose 5 % 50 mL IVPB     1 g 100 mL/hr over 30 Minutes Intravenous Every 24 hours 07/10/16 1136 07/16/16 1240        Objective:   Vitals:   07/29/16 1000 07/29/16 1727 07/29/16 2100 07/30/16 0500  BP: 128/66 120/67 (!) 122/56 131/71  Pulse: 65 60 68 63  Resp: 18 16 18 18   Temp: 98.2 F (36.8 C) 98.6 F (37 C) 98.7 F (37.1 C) 98.2 F (36.8 C)  TempSrc: Oral Oral  Oral Oral  SpO2: 100% 100% 98% 100%  Weight:   98.9 kg (218 lb) 98.9 kg (218 lb 0.6 oz)  Height:        Wt Readings from Last 3 Encounters:  07/30/16 98.9 kg (218 lb 0.6 oz)     Intake/Output Summary (Last 24 hours) at 07/30/16 0754 Last data filed at 07/30/16 0601  Gross per 24 hour  Intake              700 ml  Output             4525 ml  Net            -3825 ml     Physical Exam  Awake Alert, Oriented X 3, No new F.N deficits, Normal affect Retsof.AT,PERRAL Supple Neck,No JVD, No cervical lymphadenopathy appriciated.  Symmetrical Chest wall movement, Good air movement bilaterally, CTAB RRR,No Gallops,Rubs or new Murmurs, No Parasternal Heave +ve B.Sounds, Abd Soft, No tenderness, No organomegaly appriciated, No rebound - guarding or rigidity. No Cyanosis, Clubbing or edema, No new Rash or bruise   Temp HD catheter in right upper chest, Foley slight blood tinged urine.     Data Review:    CBC  Recent Labs Lab 07/24/16 0634 07/27/16 0519 07/28/16 0448 07/30/16 0431  WBC 8.1 12.1* 10.5 6.8  HGB 8.2* 7.9* 7.9* 7.6*  HCT 24.8* 24.3* 24.6* 24.2*  PLT 414* 463* 438* 392  MCV 87.9 92.7 93.5 93.4  MCH 29.1 30.2 30.0 29.3  MCHC 33.1 32.5 32.1 31.4  RDW 14.2 14.6 15.0 15.2  LYMPHSABS 0.7 2.3 2.1  --   MONOABS 0.3 0.7 0.5  --   EOSABS 0.0 0.2 0.3  --   BASOSABS 0.0 0.0 0.1  --     Chemistries   Recent Labs Lab 07/24/16 0634  07/26/16 0540 07/27/16 0519 07/28/16 0448 07/28/16 1243 07/30/16 0431  NA 131*  < > 137 135 139 137 138  K 6.1*  < > 4.4 3.8 4.1 5.0 3.6  CL 93*  < > 96* 96* 101 102 107  CO2 23  < > 28 25 22  19* 21*  GLUCOSE 113*  < > 86 86 90 90 116*  BUN 64*  < > 59* 85* 100* 103* 86*  CREATININE 9.07*  < > 7.92* 10.08* 10.78* 10.46* 7.90*  CALCIUM 9.2  < > 9.4 9.5 9.5 9.1 9.1  MG 2.5*  --   --   --   --   --   --   AST 24  --   --   --   --   --  28  ALT 35  --   --   --   --   --  44  ALKPHOS 56  --   --   --   --   --  83  BILITOT 0.7  --    --   --   --   --  0.3  < > = values in this interval not displayed. ------------------------------------------------------------------------------------------------------------------ No results for input(s): CHOL, HDL, LDLCALC, TRIG, CHOLHDL, LDLDIRECT in the last 72 hours.  No results found for: HGBA1C ------------------------------------------------------------------------------------------------------------------ No results for input(s): TSH, T4TOTAL, T3FREE, THYROIDAB in the last 72 hours.  Invalid input(s): FREET3 ------------------------------------------------------------------------------------------------------------------ No results for input(s): VITAMINB12, FOLATE, FERRITIN, TIBC, IRON, RETICCTPCT in the  last 72 hours.  Coagulation profile No results for input(s): INR, PROTIME in the last 168 hours.  No results for input(s): DDIMER in the last 72 hours.  Cardiac Enzymes No results for input(s): CKMB, TROPONINI, MYOGLOBIN in the last 168 hours.  Invalid input(s): CK ------------------------------------------------------------------------------------------------------------------ No results found for: BNP  Inpatient Medications  Scheduled Meds: . darbepoetin (ARANESP) injection - NON-DIALYSIS  150 mcg Subcutaneous Q Thu-1800  . docusate sodium  100 mg Oral BID  . feeding supplement (NEPRO CARB STEADY)  237 mL Oral BID BM  . heparin  5,000 Units Subcutaneous Q8H  . methadone  10 mg Oral Daily  . neomycin-bacitracin-polymyxin   Topical BID  . sevelamer carbonate  1,600 mg Oral TID WC  . sodium chloride flush  10-40 mL Intracatheter Q12H   Continuous Infusions: . ferric gluconate (FERRLECIT/NULECIT) IV Stopped (07/29/16 1044)   PRN Meds:.lidocaine (PF), lidocaine-prilocaine, morphine CONCENTRATE, pentafluoroprop-tetrafluoroeth  Micro Results No results found for this or any previous visit (from the past 240 hour(s)).  Radiology Reports US Renal  Result Date:  07/23/2016 CLINICAL DATA:  Subacute onset of renal insufficiency, and hematuria. Initial encounter. EXAM: RENAL / URINARY TRACT ULTRASOUND COMPLETE COMPARISON:  Renal ultrasound performed 07/09/2016 FINDINGS: Right Kidney: Length: 15.3 cm. Echogenicity within normal limits. No mass or hydronephrosis visualized. Left Kidney: Length: 14.9 cm. Mildly increased renal parenchymal echogenicity is noted. No mass or hydronephrosis visualized. Bladder: Decompressed, with a Foley catheter in place. IMPRESSION: 1. No evidence of hydronephrosis. 2. Mildly increased renal parenchymal echogenicity may reflect medical renal disease. 3. Bladder decompressed, with a Foley catheter in place. No significant blood clot seen within the bladder, though evaluation of the bladder is limited. Electronically Signed   By: Roanna Raider M.D.   On: 07/23/2016 18:42   US Renal  Result Date: 07/09/2016 CLINICAL DATA:  Acute kidney injury EXAM: RENAL / URINARY TRACT ULTRASOUND COMPLETE COMPARISON:  None. FINDINGS: Right Kidney: Length: 12.8 cm. Increased renal cortical echogenicity. No mass or hydronephrosis visualized. Left Kidney: Length: 12.9 cm. Increased renal cortical echogenicity. No mass or hydronephrosis visualized. Bladder: Decompressed bladder with a Foley catheter present. IMPRESSION: 1. No obstructive uropathy. 2. Increased renal cortical echogenicity as can be seen with acute renal failure. Electronically Signed   By: Elige Ko   On: 07/09/2016 12:03   Ir Fluoro Guide Cv Line Right  Result Date: 07/20/2016 INDICATION: Renal failure EXAM: TUNNELED DIALYSIS CATHETER PLACEMENT, ULTRASOUND GUIDANCE FOR VASCULAR ACCESS MEDICATIONS: Ancef; The antibiotic was administered within an appropriate time interval prior to skin puncture. ANESTHESIA/SEDATION: Versed 4 mg IV; Fentanyl 100 mcg IV; Moderate Sedation Time:  20 The patient was continuously monitored during the procedure by the interventional radiology nurse under my direct  supervision. FLUOROSCOPY TIME:  Fluoroscopy Time:  minutes 42 seconds (3 mGy). COMPLICATIONS: None immediate. PROCEDURE: Informed written consent was obtained from the patient after a thorough discussion of the procedural risks, benefits and alternatives. All questions were addressed. Maximal Sterile Barrier Technique was utilized including caps, mask, sterile gowns, sterile gloves, sterile drape, hand hygiene and skin antiseptic. A timeout was performed prior to the initiation of the procedure. The right neck was prepped with ChloraPrep in a sterile fashion, and a sterile drape was applied covering the operative field. A sterile gown and sterile gloves were used for the procedure. 1% lidocaine into the skin and subcutaneous tissue. The jugular vein was noted to be patent initially with ultrasound. Under sonographic guidance, a micropuncture needle was inserted into the right IJ  vein (Ultrasound and fluoroscopic image documentation was performed). It was removed over an 018 wire which was up-sized to an Amplatz. This was advanced into the IVC. A small incision was made in the right upper chest. The tunneling device was utilized to advance the 23 centimeter tip to cuff catheter from the chest incision and out the neck incision. A peel-away sheath was advanced over the Amplatz wire. The leading edge of the catheter was then advanced through the peel-away sheath. The peel-away sheath was removed. It was flushed and instilled with heparin. The chest incision was closed with a 0 Prolene pursestring stitch. The neck incision was closed with a 4-0 Vicryl subcuticular stitch. IMPRESSION: Successful right IJ vein tunneled dialysis catheter with its tip in the right atrium. Electronically Signed   By: Jolaine Click M.D.   On: 07/20/2016 15:17   Ir US Guide Vasc Access Right  Result Date: 07/20/2016 INDICATION: Renal failure EXAM: TUNNELED DIALYSIS CATHETER PLACEMENT, ULTRASOUND GUIDANCE FOR VASCULAR ACCESS MEDICATIONS:  Ancef; The antibiotic was administered within an appropriate time interval prior to skin puncture. ANESTHESIA/SEDATION: Versed 4 mg IV; Fentanyl 100 mcg IV; Moderate Sedation Time:  20 The patient was continuously monitored during the procedure by the interventional radiology nurse under my direct supervision. FLUOROSCOPY TIME:  Fluoroscopy Time:  minutes 42 seconds (3 mGy). COMPLICATIONS: None immediate. PROCEDURE: Informed written consent was obtained from the patient after a thorough discussion of the procedural risks, benefits and alternatives. All questions were addressed. Maximal Sterile Barrier Technique was utilized including caps, mask, sterile gowns, sterile gloves, sterile drape, hand hygiene and skin antiseptic. A timeout was performed prior to the initiation of the procedure. The right neck was prepped with ChloraPrep in a sterile fashion, and a sterile drape was applied covering the operative field. A sterile gown and sterile gloves were used for the procedure. 1% lidocaine into the skin and subcutaneous tissue. The jugular vein was noted to be patent initially with ultrasound. Under sonographic guidance, a micropuncture needle was inserted into the right IJ vein (Ultrasound and fluoroscopic image documentation was performed). It was removed over an 018 wire which was up-sized to an Amplatz. This was advanced into the IVC. A small incision was made in the right upper chest. The tunneling device was utilized to advance the 23 centimeter tip to cuff catheter from the chest incision and out the neck incision. A peel-away sheath was advanced over the Amplatz wire. The leading edge of the catheter was then advanced through the peel-away sheath. The peel-away sheath was removed. It was flushed and instilled with heparin. The chest incision was closed with a 0 Prolene pursestring stitch. The neck incision was closed with a 4-0 Vicryl subcuticular stitch. IMPRESSION: Successful right IJ vein tunneled dialysis  catheter with its tip in the right atrium. Electronically Signed   By: Jolaine Click M.D.   On: 07/20/2016 15:17   Dg Chest Port 1 View  Result Date: 07/14/2016 CLINICAL DATA:  Central line placement EXAM: PORTABLE CHEST 1 VIEW COMPARISON:  07/12/2016 FINDINGS: Endotracheal tube has been removed. Left-sided central venous catheter tip overlies the distal SVC. Right sided central venous catheter tip also overlies SVC. Esophageal tube has been removed. Mildly low lung volumes. No focal consolidation or effusion. Normal heart size. No pneumothorax. IMPRESSION: Removal of endotracheal and esophageal tubes. Tips of the central venous catheters overlie the SVC. Low lung volume but no pneumothorax or infiltrate. Electronically Signed   By: Jasmine Pang M.D.   On: 07/14/2016  00:21   Dg Chest Port 1 View  Result Date: 07/12/2016 CLINICAL DATA:  Acute respiratory failure EXAM: PORTABLE CHEST 1 VIEW COMPARISON:  07/11/2016 and prior radiographs FINDINGS: An endotracheal tube with tip 4 cm above the carina, NG tube entering stomach with tip off the field of view, right central venous catheter with tip overlying the mid SVC and left IJ central venous catheter with tip overlying the lower SVC again noted. This is a low volume film with mild bibasilar atelectasis again noted. There is no evidence of pneumothorax or definite pleural effusion. IMPRESSION: Unchanged appearance of the chest. Electronically Signed   By: Harmon Pier M.D.   On: 07/12/2016 07:11   Dg Chest Port 1 View  Result Date: 07/11/2016 CLINICAL DATA:  Hypoxia EXAM: PORTABLE CHEST 1 VIEW COMPARISON:  July 10, 2016 FINDINGS: Endotracheal tube tip is 4.1 cm above the carina. The left jugular catheter tip is at the cavoatrial junction. Right jugular catheter tip is in the superior cava near the cavoatrial junction. Nasogastric tube tip and side port are below the diaphragm. No pneumothorax. There is mild atelectasis in the right base. Lungs elsewhere clear.  Heart size and pulmonary vascularity are normal. No adenopathy. No bone lesions. IMPRESSION: Tube and catheter positions as described without evident pneumothorax. Right base atelectasis. Lungs elsewhere clear. Stable cardiac silhouette. Electronically Signed   By: Bretta Bang III M.D.   On: 07/11/2016 07:04   Dg Chest Port 1 View  Result Date: 07/10/2016 CLINICAL DATA:  Respiratory failure. EXAM: PORTABLE CHEST 1 VIEW COMPARISON:  07/09/2016 . FINDINGS: Endotracheal tube, NG tube, bilateral IJ lines in stable position. Heart size normal. Low lung volumes with mild basilar atelectasis. No change from prior exam. No pleural effusion or pneumothorax. IMPRESSION: 1. Lines and tubes stable position. 2. Low lung volumes with mild basilar atelectasis. No change from prior exam. Electronically Signed   By: Maisie Fus  Register   On: 07/10/2016 06:43   Dg Chest Port 1 View  Result Date: 07/09/2016 CLINICAL DATA:  Status post left catheter placement. EXAM: PORTABLE CHEST 1 VIEW COMPARISON:  Radiograph of July 09, 2016. FINDINGS: Stable cardiomediastinal silhouette. Endotracheal and orogastric tubes are unchanged in position. Right internal jugular catheter is unchanged. Mild bibasilar subsegmental atelectasis is now noted. Interval placement of left internal jugular dialysis catheter with distal tip in expected position of cavoatrial junction. No pneumothorax or pleural effusion is noted. IMPRESSION: Mild bibasilar subsegmental atelectasis. Interval placement of left internal jugular dialysis catheter with distal tip in expected position of cavoatrial junction. No pneumothorax is noted. Electronically Signed   By: Lupita Raider, M.D.   On: 07/09/2016 12:28   Dg Chest Port 1 View  Result Date: 07/09/2016 CLINICAL DATA:  Endotracheal tube and central line placement. EXAM: PORTABLE CHEST 1 VIEW COMPARISON:  None. FINDINGS: The heart size and mediastinal contours are within normal limits. No pneumothorax or pleural  effusion is noted. Minimal right basilar subsegmental atelectasis is noted. Right internal jugular catheter is noted with distal tip in expected position of the SVC. Endotracheal tube is projected over tracheal air shadow with distal tip 6 cm above the carina. Orogastric tube tip is seen in proximal stomach. The visualized skeletal structures are unremarkable. IMPRESSION: Endotracheal tube, orogastric tube and right internal jugular catheter in grossly good position. No pneumothorax is noted. Minimal right basilar subsegmental atelectasis. Electronically Signed   By: Lupita Raider, M.D.   On: 07/09/2016 08:44    Time Spent in minutes  30   Pearson Grippe M.D on 07/30/2016 at 7:54 AM  Between 7am to 7pm - Pager - [phone]  After 7pm go to [email or web address] - password [facility]  [facility] -  Office  [phone]

## 2016-07-30 NOTE — Progress Notes (Signed)
OT Cancellation Note  Patient Details Name: Blake Kramer MRN: [id number] DOB: [birth year]   Cancelled Treatment:    Reason Eval/Treat Not Completed: Pain limiting ability to participate;Patient declined, no reason specified. Pt declining to work with therapy at this time due to pain from catheter site. Will follow up as time allows.  [name] M.S., OTR/L Pager: [phone]  07/30/2016, 11:29 AM

## 2016-07-30 NOTE — Progress Notes (Signed)
PT Cancellation Note  Patient Details Name: Blake BoutonLindon Kramer MRN: [id number] DOB: December 27, 1995   Cancelled Treatment:    Reason Eval/Treat Not Completed: Pain limiting ability to participate. Pt reports that he continues to have a lot of pain from his catheter. Requests PT return later this afternoon to check back again. Will check back as time allows.    Colin BroachSabra M. Ajooni Karam PT, DPT  [phone]  07/30/2016, 11:09 AM

## 2016-07-31 LAB — BASIC METABOLIC PANEL
ANION GAP: 8 (ref 5–15)
BUN: 62 mg/dL — ABNORMAL HIGH (ref 6–20)
CHLORIDE: 109 mmol/L (ref 101–111)
CO2: 21 mmol/L — AB (ref 22–32)
CREATININE: 5.33 mg/dL — AB (ref 0.61–1.24)
Calcium: 9.4 mg/dL (ref 8.9–10.3)
GFR calc non Af Amer: 14 mL/min — ABNORMAL LOW (ref >60)
GFR, EST AFRICAN AMERICAN: 16 mL/min — AB (ref >60)
Glucose, Bld: 92 mg/dL (ref 65–99)
Potassium: 3.8 mmol/L (ref 3.5–5.1)
SODIUM: 138 mmol/L (ref 135–145)

## 2016-07-31 NOTE — Evaluation (Signed)
Occupational Therapy Evaluation Patient Details Name: Blake BoutonLindon Welker MRN: [id number] DOB: 13-Jan-1996 Today's Date: 07/31/2016    History of Present Illness Pt adm with drug overdose. UDS positive for opiates. Pt intubated 6/7 and self extubated 6/11. Pt with AKI and required CRRT which was stopped 6/14. To start HD on 6/16.  Pt pulled out foley with significant trauma. Also pulled out HD cath. No PMH.(per chart 10+ overdoses)   Clinical Impression   With max encouragement; pt agreeable to OOB activity. Pt able to perform UB/LB dressing at bed level with set up. Pt required min guard assist for functional mobility in room with use of RW. Pt reports pain and bleeding is the major limiting factor to him working with therapy at this time. [facility] Will continue to follow acutely.     Follow Up Recommendations  [facility]ACH;Supervision/Assistance - 24 hour    Equipment Recommendations  None recommended by OT    Recommendations for Other Services       Precautions / Restrictions Precautions Precautions: Fall Precaution Comments: Foley due to significant urethral trauma Restrictions Weight Bearing Restrictions: No      Mobility Bed Mobility Overal bed mobility: Needs Assistance Bed Mobility: Supine to Sit;Sit to Supine     Supine to sit: Supervision;HOB elevated Sit to supine: Supervision;HOB elevated   General bed mobility comments: HOB elevated, use of bed rail. increased time required but no physical assist  Transfers Overall transfer level: Needs assistance Equipment used: Rolling walker (2 wheeled) Transfers: Sit to/from Stand Sit to Stand: Min guard         General transfer comment: Min guard for safety. Hand placement good    Balance Overall balance assessment: Needs assistance Sitting-balance support: Feet supported;No upper extremity supported Sitting balance-Leahy Scale: Good     Standing balance support: No upper extremity  supported Standing balance-Leahy Scale: Fair Standing balance comment: for static standing, needs UE support for dynamic                           ADL either performed or assessed with clinical judgement   ADL Overall ADL's : Needs assistance/impaired                 Upper Body Dressing : Set up;Bed level   Lower Body Dressing: Set up;Bed level Lower Body Dressing Details (indicate cue type and reason): to don socks Toilet Transfer: Min guard;Ambulation;RW Toilet Transfer Details (indicate cue type and reason): simulated by sit to stand from EOB with functional mobility in room         Functional mobility during ADLs: Min guard;Rolling walker General ADL Comments: needs max encouragement to participate     Vision         Perception     Praxis      Pertinent Vitals/Pain Pain Assessment: Faces Faces Pain Scale: Hurts even more Pain Location: groin penis Pain Descriptors / Indicators: Grimacing;Guarding Pain Intervention(s): Monitored during session;Limited activity within patient's tolerance     Hand Dominance     Extremity/Trunk Assessment             Communication     Cognition Arousal/Alertness: Awake/alert Behavior During Therapy: WFL for tasks assessed/performed Overall Cognitive Status: Within Functional Limits for tasks assessed  General Comments       Exercises     Shoulder Instructions      Home Living                                          Prior Functioning/Environment                   OT Problem List:        OT Treatment/Interventions:      OT Goals(Current goals can be found in the care plan section) Acute Rehab OT Goals Patient Stated Goal: decrease pain and go home OT Goal Formulation: With patient  OT Frequency: Min 2X/week   Barriers to D/C:            Co-evaluation              AM-PAC PT "6 Clicks" Daily Activity      Outcome Measure Help from another person eating meals?: None Help from another person taking care of personal grooming?: A Little Help from another person toileting, which includes using toliet, bedpan, or urinal?: A Little Help from another person bathing (including washing, rinsing, drying)?: A Little Help from another person to put on and taking off regular upper body clothing?: A Little Help from another person to put on and taking off regular lower body clothing?: A Little 6 Click Score: 19   End of Session Equipment Utilized During Treatment: Rolling walker  Activity Tolerance: Patient tolerated treatment well;Patient limited by pain Patient left: in bed;with call bell/phone within reach  OT Visit Diagnosis: Unsteadiness on feet (R26.81);Muscle weakness (generalized) (M62.81);Other symptoms and signs involving cognitive function                Time: 1610-9604 OT Time Calculation (min): 18 min Charges:  OT General Charges $OT Visit: 1 Procedure OT Treatments $Self Care/Home Management : 8-22 mins G-Codes:     Francie Massing. Brett Albino, M.S., OTR/L Pager: [phone]  Gaye Alken 07/31/2016, 2:06 PM

## 2016-07-31 NOTE — Progress Notes (Signed)
Assessment/Plan: 1 Acute non-oliguric kidney injury: secondary to rhabdo. High output recovery.  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 Urethral trauma: per GU needs a Foley catheter for 4 weeks  Subjective: Interval History: Better, c/o penile pain  Objective: Vital signs in last 24 hours: Temp:  [97.8 F (36.6 C)-98.2 F (36.8 C)] 97.8 F (36.6 C) (06/29 1000) Pulse Rate:  [59-69] 59 (06/29 1000) Resp:  [16-18] 18 (06/29 1000) BP: (118-134)/(57-64) 134/61 (06/29 1000) SpO2:  [97 %-100 %] 100 % (06/29 1000) Weight:  [98.7 kg (217 lb 9.5 oz)] 98.7 kg (217 lb 9.5 oz) (06/29 0500) Weight change: -0.184 kg (-6.5 oz)  Intake/Output from previous day: 06/28 0701 - 06/29 0700 In: 3765.5 [P.O.:1490; I.V.:2165.5; IV Piggyback:110] Out: 4350 [Urine:4350] Intake/Output this shift: Total I/O In: 240 [P.O.:240] Out: -   General appearance: alert and cooperative  Lab Results:  Recent Labs  07/30/16 0431  WBC 6.8  HGB 7.6*  HCT 24.2*  PLT 392   BMET:  Recent Labs  07/30/16 0431 07/31/16 0759  NA 138 138  K 3.6 3.8  CL 107 109  CO2 21* 21*  GLUCOSE 116* 92  BUN 86* 62*  CREATININE 7.90* 5.33*  CALCIUM 9.1 9.4   No results for input(s): PTH in the last 72 hours. Iron Studies: No results for input(s): IRON, TIBC, TRANSFERRIN, FERRITIN in the last 72 hours. Studies/Results: No results found.  Scheduled: . darbepoetin (ARANESP) injection - NON-DIALYSIS  150 mcg Subcutaneous Q Thu-1800  . docusate sodium  100 mg Oral BID  . feeding supplement (NEPRO CARB STEADY)  237 mL Oral BID BM  . heparin  5,000 Units Subcutaneous Q8H  . methadone  10 mg Oral Daily  . neomycin-bacitracin-polymyxin   Topical BID  . sevelamer carbonate  1,600 mg Oral TID WC  . sodium chloride flush  10-40 mL Intracatheter Q12H    LOS: 22 days   Lela Murfin C 07/31/2016,12:09 PM

## 2016-07-31 NOTE — Progress Notes (Addendum)
09:48 Spoke with Candace from [facility], still waiting on insurance auth. She will notify CM when received.  10:50 Notified insurance denied. Dr Selena BattenKim agreed to do peer to peer. His contact info provided to [facility] liaison to give to insurance MD for peer to peer today.

## 2016-07-31 NOTE — Progress Notes (Signed)
Patient ID: Aurel Nguyen, male   DOB: [birth year], 21 y.o.   MRN: [id number]                                                                PROGRESS NOTE                                                                                                                                                                                                             Patient Demographics:    Tayler Lassen, is a 21 y.o. male, DOB - [birth year], WUJ:[id number]  Admit date - 07/09/2016   Admitting Physician Kalman Shan, MD  Outpatient Primary MD for the patient is Patient, No Pcp Per  LOS - 22  Outpatient Specialists:     No chief complaint on file.      Brief Narrative    Allegedly drug user and dealer [see psych Md note 6/20] Admit by PCCM from [facility] with OD, Fentanyl/morphine Intubated for airway protxn Noted Mild hypotension on admission, positive troponin, pinpoint pupils, hyperkalemia and oliguria Narcan unrespnsive Lactate 10.4, SCr 4.4, K 7.1, AST 1453, ALT 1008, AG 23, WBC 18.9, Hgb 17.8, Trop 4.48. UDS positive for opiates  Initially admitted and needed CRRT transitioning to intermittent dialysis Hospital course complicated by self discontinuation of Foley catheter causing urethral trauma necessitating urgent urological intervention and a coud catheter placement Has not yet and deemed end-stage renal disease and it appears his renal function might slowly be improving Disposition is still unclear   Subjective:    Serigne Kubicek today is having his iv changed.   Pt has ARF, and is being followed by nephrology,  Pt still having some hematuria in foley catheter.   No headache, No chest pain, No abdominal pain - No Nausea, No new weakness tingling or numbness, No Cough - SOB.    Assessment  & Plan :    Principal Problem:   Drug overdose Active Problems:   AKI (acute kidney injury) (HCC)   Acute respiratory failure (HCC)   Urethral bleeding    Illicit Drug overdose - intent  unclear  Pt now more alert and lucid  Psychiatry has seen and cleared him from suicidality perspective   Acute resp failure wi and pharmacy to th hypoxia Resolved -due to OD   Urethral trauma  Repeat urethral trauma 6/19 probably secondary to heparinization in dialysis Nursing has been instructed to to get strict I's and O's on the patient, this is sometimes limited by patient's resistance to catheter flushes Patient self DC'd Foley initially 6/11 and needed urgent urological intervention and coud catheter placement At dilaysis on 6/19 experienced pain and found to be in pool of blood-this seems to have ceased Appreciate Dr. Berneice Heinrich input, felt 2/2 heparin c Dialysis? urine with clots 6/21, but seems to have resolved 6/25 US renal 6/10 rules out hydronephrosis as per Dr. Mena Goes, appreciate input plan for at least 4 wksper urology -do NOTremove Foley catheter Appreciate Dr. Elon Spanner input  Anemia  From hematuria ? Cbc in am If Hgb <7 may need transfusion  Acute oliguric kidney injury secondary to rhabdomyolysis on CRRT in the ICU not yet declared ESRD Orthostatic hypotension noted 6/25, ? 2/2 to ABLA vs vol depletion from dialysis uncertain whether he will need ongoing dialysis--Last dialysis 6/23, nephrology seems to think he may not need long-term dialysis which would be good given his drug history. He is now passing ~ 1,200 liter sof urine daily-??atn in post-oliguric phase Given IVF today 6/25 per nephro--orthostatics ++ with lows in 60 systolic--not septic May potentially be a candidate for LTAC??  Appreciate nephrology input Await CM and Sw input  Shock Liver / Transaminitis Due to above - resolved   Acute blood loss due to urethral trauma See above Hemoglobin currently 8.2--->7.9 Might transfuse 6/26 am if hemoglobin any lower, type ans screened 6.25 iron studies 6/21 showed iron of 36 and saturation of  11 receiving EPO/IV iron per nephro Start iron feso4 325 bid   Acute encephalopathy  due to intentionaldrug overdose + sedation(Heroin and Fentanyl) - methadone 10 mg daily Have discontinued IV morphine, use roxanol for foley flushes Have discontinued psychotropics Patient cleared from this perspective of from psychiatry and now await medical disposition in terms of renal insufficiency  R>L hearing loss Etiology unclear-Hypotension, ?Precedex? Propofol D/w Dr. 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     Code Status:FULL CODE  Family Communication :w/ patient  Disposition Plan:awaiting LTAC  Barriers For Discharge:  Consults :Nephrology, pccm, urology  Procedures :   DVT Prophylaxis: Heparin - SCDs  Lab Results  Component Value Date   PLT 392 07/30/2016     Anti-infectives    Start     Dose/Rate Route Frequency Ordered Stop   07/20/16 1432  vancomycin (VANCOCIN) 1-5 GM/200ML-% IVPB    Comments:  Georgette Shell   : cabinet override      07/20/16 1432 07/20/16 1553   07/20/16 1300  vancomycin (VANCOCIN) IVPB 1000 mg/200 mL premix     1,000 mg 200 mL/hr over 60 Minutes Intravenous To [facility] 07/20/16 1256 07/20/16 1540   07/10/16 1200  cefTRIAXone (ROCEPHIN) 1 g in dextrose 5 % 50 mL IVPB     1 g 100 mL/hr over 30 Minutes Intravenous Every 24 hours 07/10/16 1136 07/16/16 1240        Objective:   Vitals:   07/30/16 1432 07/30/16 2042 07/31/16 0500 07/31/16 1000  BP: 125/64 (!) 123/58 (!) 118/57 134/61  Pulse: 69 66  64 (!) 59  Resp: 16 18 18 18   Temp: 97.9 F (36.6 C) 98 F  (36.7 C) 98.2 F (36.8 C) 97.8 F (36.6 C)  TempSrc: Oral Oral Oral Oral  SpO2: 97% 100% 99% 100%  Weight:   98.7 kg (217 lb 9.5 oz)   Height:        Wt Readings from Last 3 Encounters:  07/31/16 98.7 kg (217 lb 9.5 oz)     Intake/Output Summary (Last 24 hours) at 07/31/16 1255 Last data filed at 07/31/16 0900  Gross per 24 hour  Intake           3642.5 ml  Output             3725 ml  Net            -82.5 ml     Physical Exam  Awake Alert, Oriented X 3, No new F.N deficits, Normal affect [place].AT,PERRAL, pale conjunctiva Supple Neck,No JVD, No cervical lymphadenopathy appriciated.  Symmetrical Chest wall movement, Good air movement bilaterally, CTAB RRR,No Gallops,Rubs or new Murmurs, No Parasternal Heave +ve B.Sounds, Abd Soft, No tenderness, No organomegaly appriciated, No rebound - guarding or rigidity. No Cyanosis, Clubbing or edema, No new Rash or bruise   Foley in place and slight blood in foley Temp HD catheter in right upper chest No janeway, no osler, no splinter    Data Review:    CBC  Recent Labs Lab 07/27/16 0519 07/28/16 0448 07/30/16 0431  WBC 12.1* 10.5 6.8  HGB 7.9* 7.9* 7.6*  HCT 24.3* 24.6* 24.2*  PLT 463* 438* 392  MCV 92.7 93.5 93.4  MCH 30.2 30.0 29.3  MCHC 32.5 32.1 31.4  RDW 14.6 15.0 15.2  LYMPHSABS 2.3 2.1  --   MONOABS 0.7 0.5  --   EOSABS 0.2 0.3  --   BASOSABS 0.0 0.1  --     Chemistries   Recent Labs Lab 07/27/16 0519 07/28/16 0448 07/28/16 1243 07/30/16 0431 07/31/16 0759  NA 135 139 137 138 138  K 3.8 4.1 5.0 3.6 3.8  CL 96* 101 102 107 109  CO2 25 22 19* 21* 21*  GLUCOSE 86 90 90 116* 92  BUN 85* 100* 103* 86* 62*  CREATININE 10.08* 10.78* 10.46* 7.90* 5.33*  CALCIUM 9.5 9.5 9.1 9.1 9.4  AST  --   --   --  28  --   ALT  --   --   --  44  --   ALKPHOS  --   --   --  83  --   BILITOT  --   --   --  0.3  --     ------------------------------------------------------------------------------------------------------------------ No results for input(s): CHOL, HDL, LDLCALC, TRIG, CHOLHDL, LDLDIRECT in the last 72 hours.  No results found for: HGBA1C ------------------------------------------------------------------------------------------------------------------ No results for input(s): TSH, T4TOTAL, T3FREE, THYROIDAB in the last 72 hours.  Invalid input(s): FREET3 ------------------------------------------------------------------------------------------------------------------ No results for input(s): VITAMINB12, FOLATE, FERRITIN, TIBC, IRON, RETICCTPCT in the last 72 hours.  Coagulation profile No results for input(s): INR, PROTIME in the last 168 hours.  No results for input(s): DDIMER in the last 72 hours.  Cardiac Enzymes No results for input(s): CKMB, TROPONINI, MYOGLOBIN in the last 168 hours.  Invalid input(s): CK ------------------------------------------------------------------------------------------------------------------ No results found for: BNP  Inpatient Medications  Scheduled Meds: . darbepoetin (ARANESP) injection - NON-DIALYSIS  150 mcg Subcutaneous Q Thu-1800  . docusate sodium  100 mg Oral BID  . feeding supplement (NEPRO CARB  STEADY)  237 mL Oral BID BM  . heparin  5,000 Units Subcutaneous Q8H  . methadone  10 mg Oral Daily  . neomycin-bacitracin-polymyxin   Topical BID  . sodium chloride flush  10-40 mL Intracatheter Q12H   Continuous Infusions: . sodium chloride 100 mL/hr at 07/31/16 1214  . ferric gluconate (FERRLECIT/NULECIT) IV Stopped (07/30/16 1343)   PRN Meds:.lidocaine (PF), lidocaine-prilocaine, morphine CONCENTRATE, pentafluoroprop-tetrafluoroeth  Micro Results No results found for this or any previous visit (from the past 240 hour(s)).  Radiology Reports US Renal  Result Date: 07/23/2016 CLINICAL DATA:  Subacute onset of renal insufficiency, and  hematuria. Initial encounter. EXAM: RENAL / URINARY TRACT ULTRASOUND COMPLETE COMPARISON:  Renal ultrasound performed 07/09/2016 FINDINGS: Right Kidney: Length: 15.3 cm. Echogenicity within normal limits. No mass or hydronephrosis visualized. Left Kidney: Length: 14.9 cm. Mildly increased renal parenchymal echogenicity is noted. No mass or hydronephrosis visualized. Bladder: Decompressed, with a Foley catheter in place. IMPRESSION: 1. No evidence of hydronephrosis. 2. Mildly increased renal parenchymal echogenicity may reflect medical renal disease. 3. Bladder decompressed, with a Foley catheter in place. No significant blood clot seen within the bladder, though evaluation of the bladder is limited. Electronically Signed   By: Roanna Raider M.D.   On: 07/23/2016 18:42   US Renal  Result Date: 07/09/2016 CLINICAL DATA:  Acute kidney injury EXAM: RENAL / URINARY TRACT ULTRASOUND COMPLETE COMPARISON:  None. FINDINGS: Right Kidney: Length: 12.8 cm. Increased renal cortical echogenicity. No mass or hydronephrosis visualized. Left Kidney: Length: 12.9 cm. Increased renal cortical echogenicity. No mass or hydronephrosis visualized. Bladder: Decompressed bladder with a Foley catheter present. IMPRESSION: 1. No obstructive uropathy. 2. Increased renal cortical echogenicity as can be seen with acute renal failure. Electronically Signed   By: Elige Ko   On: 07/09/2016 12:03   Ir Fluoro Guide Cv Line Right  Result Date: 07/20/2016 INDICATION: Renal failure EXAM: TUNNELED DIALYSIS CATHETER PLACEMENT, ULTRASOUND GUIDANCE FOR VASCULAR ACCESS MEDICATIONS: Ancef; The antibiotic was administered within an appropriate time interval prior to skin puncture. ANESTHESIA/SEDATION: Versed 4 mg IV; Fentanyl 100 mcg IV; Moderate Sedation Time:  20 The patient was continuously monitored during the procedure by the interventional radiology nurse under my direct supervision. FLUOROSCOPY TIME:  Fluoroscopy Time:  minutes 42 seconds (3  mGy). COMPLICATIONS: None immediate. PROCEDURE: Informed written consent was obtained from the patient after a thorough discussion of the procedural risks, benefits and alternatives. All questions were addressed. Maximal Sterile Barrier Technique was utilized including caps, mask, sterile gowns, sterile gloves, sterile drape, hand hygiene and skin antiseptic. A timeout was performed prior to the initiation of the procedure. The right neck was prepped with ChloraPrep in a sterile fashion, and a sterile drape was applied covering the operative field. A sterile gown and sterile gloves were used for the procedure. 1% lidocaine into the skin and subcutaneous tissue. The jugular vein was noted to be patent initially with ultrasound. Under sonographic guidance, a micropuncture needle was inserted into the right IJ vein (Ultrasound and fluoroscopic image documentation was performed). It was removed over an 018 wire which was up-sized to an Amplatz. This was advanced into the IVC. A small incision was made in the right upper chest. The tunneling device was utilized to advance the 23 centimeter tip to cuff catheter from the chest incision and out the neck incision. A peel-away sheath was advanced over the Amplatz wire. The leading edge of the catheter was then advanced through the peel-away sheath. The peel-away sheath was removed.  It was flushed and instilled with heparin. The chest incision was closed with a 0 Prolene pursestring stitch. The neck incision was closed with a 4-0 Vicryl subcuticular stitch. IMPRESSION: Successful right IJ vein tunneled dialysis catheter with its tip in the right atrium. Electronically Signed   By: Jolaine ClickArthur  Hoss M.D.   On: 07/20/2016 15:17   Ir [place] Guide Vasc Access Right  Result Date: 07/20/2016 INDICATION: Renal failure EXAM: TUNNELED DIALYSIS CATHETER PLACEMENT, ULTRASOUND GUIDANCE FOR VASCULAR ACCESS MEDICATIONS: Ancef; The antibiotic was administered within an appropriate time interval  prior to skin puncture. ANESTHESIA/SEDATION: Versed 4 mg IV; Fentanyl 100 mcg IV; Moderate Sedation Time:  20 The patient was continuously monitored during the procedure by the interventional radiology nurse under my direct supervision. FLUOROSCOPY TIME:  Fluoroscopy Time:  minutes 42 seconds (3 mGy). COMPLICATIONS: None immediate. PROCEDURE: Informed written consent was obtained from the patient after a thorough discussion of the procedural risks, benefits and alternatives. All questions were addressed. Maximal Sterile Barrier Technique was utilized including caps, mask, sterile gowns, sterile gloves, sterile drape, hand hygiene and skin antiseptic. A timeout was performed prior to the initiation of the procedure. The right neck was prepped with ChloraPrep in a sterile fashion, and a sterile drape was applied covering the operative field. A sterile gown and sterile gloves were used for the procedure. 1% lidocaine into the skin and subcutaneous tissue. The jugular vein was noted to be patent initially with ultrasound. Under sonographic guidance, a micropuncture needle was inserted into the right IJ vein (Ultrasound and fluoroscopic image documentation was performed). It was removed over an 018 wire which was up-sized to an Amplatz. This was advanced into the IVC. A small incision was made in the right upper chest. The tunneling device was utilized to advance the 23 centimeter tip to cuff catheter from the chest incision and out the neck incision. A peel-away sheath was advanced over the Amplatz wire. The leading edge of the catheter was then advanced through the peel-away sheath. The peel-away sheath was removed. It was flushed and instilled with heparin. The chest incision was closed with a 0 Prolene pursestring stitch. The neck incision was closed with a 4-0 Vicryl subcuticular stitch. IMPRESSION: Successful right IJ vein tunneled dialysis catheter with its tip in the right atrium. Electronically Signed   By:  Jolaine ClickArthur  Hoss M.D.   On: 07/20/2016 15:17   Dg Chest Port 1 View  Result Date: 07/14/2016 CLINICAL DATA:  Central line placement EXAM: PORTABLE CHEST 1 VIEW COMPARISON:  07/12/2016 FINDINGS: Endotracheal tube has been removed. Left-sided central venous catheter tip overlies the distal SVC. Right sided central venous catheter tip also overlies SVC. Esophageal tube has been removed. Mildly low lung volumes. No focal consolidation or effusion. Normal heart size. No pneumothorax. IMPRESSION: Removal of endotracheal and esophageal tubes. Tips of the central venous catheters overlie the SVC. Low lung volume but no pneumothorax or infiltrate. Electronically Signed   By: Jasmine PangKim  Fujinaga M.D.   On: 07/14/2016 00:21   Dg Chest Port 1 View  Result Date: 07/12/2016 CLINICAL DATA:  Acute respiratory failure EXAM: PORTABLE CHEST 1 VIEW COMPARISON:  07/11/2016 and prior radiographs FINDINGS: An endotracheal tube with tip 4 cm above the carina, NG tube entering stomach with tip off the field of view, right central venous catheter with tip overlying the mid SVC and left IJ central venous catheter with tip overlying the lower SVC again noted. This is a low volume film with mild bibasilar atelectasis again noted.  There is no evidence of pneumothorax or definite pleural effusion. IMPRESSION: Unchanged appearance of the chest. Electronically Signed   By: Harmon Pier M.D.   On: 07/12/2016 07:11   Dg Chest Port 1 View  Result Date: 07/11/2016 CLINICAL DATA:  Hypoxia EXAM: PORTABLE CHEST 1 VIEW COMPARISON:  July 10, 2016 FINDINGS: Endotracheal tube tip is 4.1 cm above the carina. The left jugular catheter tip is at the cavoatrial junction. Right jugular catheter tip is in the superior cava near the cavoatrial junction. Nasogastric tube tip and side port are below the diaphragm. No pneumothorax. There is mild atelectasis in the right base. Lungs elsewhere clear. Heart size and pulmonary vascularity are normal. No adenopathy. No bone  lesions. IMPRESSION: Tube and catheter positions as described without evident pneumothorax. Right base atelectasis. Lungs elsewhere clear. Stable cardiac silhouette. Electronically Signed   By: Bretta Bang III M.D.   On: 07/11/2016 07:04   Dg Chest Port 1 View  Result Date: 07/10/2016 CLINICAL DATA:  Respiratory failure. EXAM: PORTABLE CHEST 1 VIEW COMPARISON:  07/09/2016 . FINDINGS: Endotracheal tube, NG tube, bilateral IJ lines in stable position. Heart size normal. Low lung volumes with mild basilar atelectasis. No change from prior exam. No pleural effusion or pneumothorax. IMPRESSION: 1. Lines and tubes stable position. 2. Low lung volumes with mild basilar atelectasis. No change from prior exam. Electronically Signed   By: Maisie Fus  Register   On: 07/10/2016 06:43   Dg Chest Port 1 View  Result Date: 07/09/2016 CLINICAL DATA:  Status post left catheter placement. EXAM: PORTABLE CHEST 1 VIEW COMPARISON:  Radiograph of July 09, 2016. FINDINGS: Stable cardiomediastinal silhouette. Endotracheal and orogastric tubes are unchanged in position. Right internal jugular catheter is unchanged. Mild bibasilar subsegmental atelectasis is now noted. Interval placement of left internal jugular dialysis catheter with distal tip in expected position of cavoatrial junction. No pneumothorax or pleural effusion is noted. IMPRESSION: Mild bibasilar subsegmental atelectasis. Interval placement of left internal jugular dialysis catheter with distal tip in expected position of cavoatrial junction. No pneumothorax is noted. Electronically Signed   By: Lupita Raider, M.D.   On: 07/09/2016 12:28   Dg Chest Port 1 View  Result Date: 07/09/2016 CLINICAL DATA:  Endotracheal tube and central line placement. EXAM: PORTABLE CHEST 1 VIEW COMPARISON:  None. FINDINGS: The heart size and mediastinal contours are within normal limits. No pneumothorax or pleural effusion is noted. Minimal right basilar subsegmental atelectasis is  noted. Right internal jugular catheter is noted with distal tip in expected position of the SVC. Endotracheal tube is projected over tracheal air shadow with distal tip 6 cm above the carina. Orogastric tube tip is seen in proximal stomach. The visualized skeletal structures are unremarkable. IMPRESSION: Endotracheal tube, orogastric tube and right internal jugular catheter in grossly good position. No pneumothorax is noted. Minimal right basilar subsegmental atelectasis. Electronically Signed   By: Lupita Raider, M.D.   On: 07/09/2016 08:44    Time Spent in minutes  30   Pearson Grippe M.D on 07/31/2016 at 12:55 PM  Between 7am to 7pm - Pager - [phone] After 7pm go to [email or web address] - password [facility]  [facility] -  Office  [phone]

## 2016-07-31 NOTE — Clinical Social Work Note (Signed)
Patient has a court date next week and mother requested letter informing court that patient is in the hospital. Letter completed and faxed to Lucita Ferraraonija Harrison [phone]- [phone]. Copies of letter also given to patient.  Genelle BalVanessa Muhammadali Ries, MSW, LCSW Licensed Clinical Social Worker Clinical Social Work Department [facility] [phone]

## 2016-08-01 ENCOUNTER — Inpatient Hospital Stay (HOSPITAL_COMMUNITY): Payer: BLUE CROSS/BLUE SHIELD

## 2016-08-01 ENCOUNTER — Encounter (HOSPITAL_COMMUNITY): Payer: Self-pay | Admitting: Diagnostic Radiology

## 2016-08-01 DIAGNOSIS — N179 Acute kidney failure, unspecified: Secondary | ICD-10-CM

## 2016-08-01 HISTORY — PX: IR REMOVAL TUN CV CATH W/O FL: IMG2289

## 2016-08-01 LAB — BASIC METABOLIC PANEL
ANION GAP: 8 (ref 5–15)
BUN: 50 mg/dL — ABNORMAL HIGH (ref 6–20)
CHLORIDE: 109 mmol/L (ref 101–111)
CO2: 20 mmol/L — AB (ref 22–32)
CREATININE: 4.08 mg/dL — AB (ref 0.61–1.24)
Calcium: 9.4 mg/dL (ref 8.9–10.3)
GFR calc non Af Amer: 20 mL/min — ABNORMAL LOW (ref >60)
GFR, EST AFRICAN AMERICAN: 23 mL/min — AB (ref >60)
Glucose, Bld: 96 mg/dL (ref 65–99)
POTASSIUM: 3.5 mmol/L (ref 3.5–5.1)
SODIUM: 137 mmol/L (ref 135–145)

## 2016-08-01 MED ORDER — CHLORHEXIDINE GLUCONATE 4 % EX LIQD
CUTANEOUS | Status: AC
  Filled 2016-08-01: qty 15

## 2016-08-01 MED ORDER — LIDOCAINE HCL (PF) 1 % IJ SOLN
INTRAMUSCULAR | Status: AC
  Filled 2016-08-01: qty 30

## 2016-08-01 MED ORDER — ACETAMINOPHEN 325 MG PO TABS
650.0000 mg | ORAL_TABLET | Freq: Four times a day (QID) | ORAL | Status: DC | PRN
  Administered 2016-08-03: 650 mg via ORAL
  Filled 2016-08-01 (×2): qty 2

## 2016-08-01 NOTE — Progress Notes (Signed)
Spoke to IR, they will remove the temporary hemodialysis catheter, we appreciate their input

## 2016-08-01 NOTE — Progress Notes (Signed)
Patient ID: Blake Kramer, male   DOB: 04/27/1995, 21 y.o.   MRN: [id number]                                                                PROGRESS NOTE                                                                                                                                                                                                             Patient Demographics:    Blake Kramer, is a 21 y.o. male, DOB - November 16, 1995, WUJ:[id number]  Admit date - 07/09/2016   Admitting Physician Kalman Shan, MD  Outpatient Primary MD for the patient is Patient, No Pcp Per  LOS - 23  Outpatient Specialists:     No chief complaint on file.      Brief Narrative  Allegedly drug user and dealer [see psych Md note 6/20] Admit by PCCM from [place] with OD, Fentanyl/morphine Intubated for airway protxn Noted Mild hypotension on admission, positive troponin, pinpoint pupils, hyperkalemia and oliguria Narcan unrespnsive Lactate 10.4, SCr 4.4, K 7.1, AST 1453, ALT 1008, AG 23, WBC 18.9, Hgb 17.8, Trop 4.48. UDS positive for opiates  Initially admitted and needed CRRT transitioning to intermittent dialysis Hospital course complicated by self discontinuation of Foley catheter causing urethral trauma necessitating urgent urological intervention and a coud catheter placement Has not yet and deemed end-stage renal disease and it appears his renal function might slowly be improving Disposition is still unclear   Subjective:    Burlon Centrella today has is doing well, still some hematuria in his foley. Pt bleeding slightly around his foley Pt is being followed by nephrology for ARF secondary to [facility]. Recommendation to try to remove TDC   No headache, No chest pain, No abdominal pain - No Nausea, No new weakness tingling or numbness, No Cough - SOB.    Assessment  & Plan :    Principal Problem:   Drug overdose Active Problems:   AKI (acute kidney injury) (HCC)   Acute respiratory failure  (HCC)   Urethral bleeding    Illicit Drug overdose - intent unclear  Pt now more alert and lucid  Psychiatry has seen and cleared him from suicidality perspective   Acute resp failure wi and pharmacy to th hypoxia Resolved -due  to OD   Urethral trauma  Repeat urethral trauma 6/19 probably secondary to heparinization in dialysis Nursing has been instructed to to get strict I's and O's on the patient, this is sometimes limited by patient's resistance to catheter flushes Patient self DC'd Foley initially 6/11 and needed urgent urological intervention and coud catheter placement At dilaysis on 6/19 experienced pain and found to be in pool of blood-this seems to have ceased Appreciate Dr. Berneice HeinrichManny input, felt 2/2 heparin c Dialysis?urine with clots 6/21, but seems to have resolved 6/25 US renal 6/10 rules out hydronephrosis as per Dr. Mena GoesEskridge, appreciate input plan for at least 4 wksper urology -do NOTremove Foley catheter Appreciate Dr. Elon SpannerHerricks input DC heparin   Anemia From hematuria ? If Hgb <7 may need transfusion Repeat cbc in am  Acute oliguric kidney injury secondary to rhabdomyolysis on CRRT in the ICU not yet declared ESRD Orthostatic hypotension noted 6/25, ? 2/2 to ABLA vs vol depletion from dialysis uncertain whether he will need ongoing dialysis--Last dialysis 6/23, nephrology seems to think he may not need long-term dialysis which would be good given his drug history. He is now passing ~ 1,200 liter sof urine daily-??atn in post-oliguric phase Given IVF today 6/25 per nephro--orthostatics ++ with lows in 60 systolic--not septic May potentially be a candidate for LTAC??  Appreciate nephrology input Await CM and Sw input I spoke with physician for his insurance company regarding his dispo.  They recommended staying inpatient in the hospital rather than LTAC and state that we can appeal.  Its unclear why they are against LTAC, His renal function is  slowly improving, and he will need very close monitoring in a safe environment where he would not be tempted to inject iv drugs resulting in endocarditis It may take a while for his renal function to improve but he may also never return to normal renal function.  IR consult called to remove temporary dialysis catheter    Shock Liver / Transaminitis Due to above - resolved   Acute blood loss due to urethral trauma See above Hemoglobin currently 8.2--->7.9 Might transfuse 6/26 am if hemoglobin any lower, type ans screened 6.25 iron studies 6/21 showed iron of 36 and saturation of 11 receiving EPO/IV iron per nephro Start iron feso4 325 bid   Acute encephalopathy  due to intentionaldrug overdose + sedation(Heroin and Fentanyl) - methadone 10 mg daily Have discontinued IV morphine, use roxanol for foley flushes Have discontinued psychotropics Patient cleared from this perspective of from psychiatry and now await medical disposition in terms of renal insufficiency  R>L hearing loss Etiology unclear-Hypotension, ?Precedex? Propofol D/w Dr. 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     Code Status:FULL CODE  Family Communication :w/ patient  Disposition Plan:awaiting LTAC, see above  Barriers For Discharge:  Consults :Nephrology, pccm, urology  Procedures :   DVT Prophylaxis: SCDs      Lab Results  Component Value Date   PLT 392 07/30/2016      Anti-infectives    Start     Dose/Rate Route Frequency Ordered Stop   07/20/16 1432  vancomycin (VANCOCIN) 1-5 GM/200ML-% IVPB    Comments:   Georgette ShellSpencer, Sara   :  cabinet override      07/20/16 1432 07/20/16 1553   07/20/16 1300  vancomycin (VANCOCIN) IVPB 1000 mg/200 mL premix     1,000 mg 200 mL/hr over 60 Minutes Intravenous To ShortStay Surgical 07/20/16 1256 07/20/16 1540   07/10/16 1200  cefTRIAXone (ROCEPHIN) 1 g in dextrose 5 % 50 mL IVPB     1 g 100 mL/hr over 30 Minutes Intravenous Every 24 hours 07/10/16 1136 07/16/16 1240        Objective:   Vitals:   07/31/16 1000 07/31/16 1722 07/31/16 2030 08/01/16 0518  BP: 134/61 (!) 128/57 (!) 125/100 121/63  Pulse: (!) 59 60 73 81  Resp: 18 18 18 18   Temp: 97.8 F (36.6 C) 98 F (36.7 C) 98.4 F (36.9 C) 98.1 F (36.7 C)  TempSrc: Oral Oral Oral Oral  SpO2: 100% 100% 99% 99%  Weight:      Height:        Wt Readings from Last 3 Encounters:  07/31/16 98.7 kg (217 lb 9.5 oz)     Intake/Output Summary (Last 24 hours) at 08/01/16 0702 Last data filed at 08/01/16 0600  Gross per 24 hour  Intake          4020.42 ml  Output             5950 ml  Net         -1929.58 ml     Physical Exam  Awake Alert, Oriented X 3, No new F.N deficits, Normal affect [place].AT,PERRAL Supple Neck,No JVD, No cervical lymphadenopathy appriciated.  Symmetrical Chest wall movement, Good air movement bilaterally, CTAB RRR,No Gallops,Rubs or new Murmurs, No Parasternal Heave +ve B.Sounds, Abd Soft, No tenderness, No organomegaly appriciated, No rebound - guarding or rigidity. No Cyanosis, Clubbing or edema, No new Rash or bruise   Foley in place, slight hematuria, slight bleeding around catheter Temporary HD catheter in place right upper chest    Data Review:    CBC  Recent Labs Lab 07/27/16 0519 07/28/16 0448 07/30/16 0431  WBC 12.1* 10.5 6.8  HGB 7.9* 7.9* 7.6*  HCT 24.3* 24.6* 24.2*  PLT 463* 438* 392  MCV 92.7 93.5 93.4  MCH 30.2 30.0 29.3  MCHC 32.5 32.1 31.4  RDW 14.6 15.0 15.2  LYMPHSABS 2.3 2.1  --   MONOABS 0.7 0.5  --   EOSABS 0.2 0.3  --   BASOSABS 0.0 0.1   --     Chemistries   Recent Labs Lab 07/28/16 0448 07/28/16 1243 07/30/16 0431 07/31/16 0759 08/01/16 0401  NA 139 137 138 138 137  K 4.1 5.0 3.6 3.8 3.5  CL 101 102 107 109 109  CO2 22 19* 21* 21* 20*  GLUCOSE 90 90 116* 92 96  BUN 100* 103* 86* 62* 50*  CREATININE 10.78* 10.46* 7.90* 5.33* 4.08*  CALCIUM 9.5 9.1 9.1 9.4 9.4  AST  --   --  28  --   --   ALT  --   --  44  --   --   ALKPHOS  --   --  83  --   --   BILITOT  --   --  0.3  --   --    ------------------------------------------------------------------------------------------------------------------ No results for input(s): CHOL, HDL, LDLCALC, TRIG, CHOLHDL, LDLDIRECT in the last 72 hours.  No results found for: HGBA1C ------------------------------------------------------------------------------------------------------------------ No results for input(s): TSH, T4TOTAL, T3FREE, THYROIDAB in the last 72 hours.  Invalid input(s): FREET3 ------------------------------------------------------------------------------------------------------------------ No results for input(s):  VITAMINB12, FOLATE, FERRITIN, TIBC, IRON, RETICCTPCT in the last 72 hours.  Coagulation profile No results for input(s): INR, PROTIME in the last 168 hours.  No results for input(s): DDIMER in the last 72 hours.  Cardiac Enzymes No results for input(s): CKMB, TROPONINI, MYOGLOBIN in the last 168 hours.  Invalid input(s): CK ------------------------------------------------------------------------------------------------------------------ No results found for: BNP  Inpatient Medications  Scheduled Meds: . darbepoetin (ARANESP) injection - NON-DIALYSIS  150 mcg Subcutaneous Q Thu-1800  . docusate sodium  100 mg Oral BID  . feeding supplement (NEPRO CARB STEADY)  237 mL Oral BID BM  . heparin  5,000 Units Subcutaneous Q8H  . methadone  10 mg Oral Daily  . neomycin-bacitracin-polymyxin   Topical BID  . sodium chloride flush  10-40 mL  Intracatheter Q12H   Continuous Infusions: . sodium chloride 100 mL/hr at 08/01/16 0306  . ferric gluconate (FERRLECIT/NULECIT) IV Stopped (07/31/16 1357)   PRN Meds:.lidocaine (PF), lidocaine-prilocaine, morphine CONCENTRATE, pentafluoroprop-tetrafluoroeth  Micro Results No results found for this or any previous visit (from the past 240 hour(s)).  Radiology Reports US Renal  Result Date: 07/23/2016 CLINICAL DATA:  Subacute onset of renal insufficiency, and hematuria. Initial encounter. EXAM: RENAL / URINARY TRACT ULTRASOUND COMPLETE COMPARISON:  Renal ultrasound performed 07/09/2016 FINDINGS: Right Kidney: Length: 15.3 cm. Echogenicity within normal limits. No mass or hydronephrosis visualized. Left Kidney: Length: 14.9 cm. Mildly increased renal parenchymal echogenicity is noted. No mass or hydronephrosis visualized. Bladder: Decompressed, with a Foley catheter in place. IMPRESSION: 1. No evidence of hydronephrosis. 2. Mildly increased renal parenchymal echogenicity may reflect medical renal disease. 3. Bladder decompressed, with a Foley catheter in place. No significant blood clot seen within the bladder, though evaluation of the bladder is limited. Electronically Signed   By: Roanna Raider M.D.   On: 07/23/2016 18:42   US Renal  Result Date: 07/09/2016 CLINICAL DATA:  Acute kidney injury EXAM: RENAL / URINARY TRACT ULTRASOUND COMPLETE COMPARISON:  None. FINDINGS: Right Kidney: Length: 12.8 cm. Increased renal cortical echogenicity. No mass or hydronephrosis visualized. Left Kidney: Length: 12.9 cm. Increased renal cortical echogenicity. No mass or hydronephrosis visualized. Bladder: Decompressed bladder with a Foley catheter present. IMPRESSION: 1. No obstructive uropathy. 2. Increased renal cortical echogenicity as can be seen with acute renal failure. Electronically Signed   By: Elige Ko   On: 07/09/2016 12:03   Ir Fluoro Guide Cv Line Right  Result Date: 07/20/2016 INDICATION: Renal  failure EXAM: TUNNELED DIALYSIS CATHETER PLACEMENT, ULTRASOUND GUIDANCE FOR VASCULAR ACCESS MEDICATIONS: Ancef; The antibiotic was administered within an appropriate time interval prior to skin puncture. ANESTHESIA/SEDATION: Versed 4 mg IV; Fentanyl 100 mcg IV; Moderate Sedation Time:  20 The patient was continuously monitored during the procedure by the interventional radiology nurse under my direct supervision. FLUOROSCOPY TIME:  Fluoroscopy Time:  minutes 42 seconds (3 mGy). COMPLICATIONS: None immediate. PROCEDURE: Informed written consent was obtained from the patient after a thorough discussion of the procedural risks, benefits and alternatives. All questions were addressed. Maximal Sterile Barrier Technique was utilized including caps, mask, sterile gowns, sterile gloves, sterile drape, hand hygiene and skin antiseptic. A timeout was performed prior to the initiation of the procedure. The right neck was prepped with ChloraPrep in a sterile fashion, and a sterile drape was applied covering the operative field. A sterile gown and sterile gloves were used for the procedure. 1% lidocaine into the skin and subcutaneous tissue. The jugular vein was noted to be patent initially with ultrasound. Under sonographic guidance, a micropuncture  needle was inserted into the right IJ vein (Ultrasound and fluoroscopic image documentation was performed). It was removed over an 018 wire which was up-sized to an Amplatz. This was advanced into the IVC. A small incision was made in the right upper chest. The tunneling device was utilized to advance the 23 centimeter tip to cuff catheter from the chest incision and out the neck incision. A peel-away sheath was advanced over the Amplatz wire. The leading edge of the catheter was then advanced through the peel-away sheath. The peel-away sheath was removed. It was flushed and instilled with heparin. The chest incision was closed with a 0 Prolene pursestring stitch. The neck incision  was closed with a 4-0 Vicryl subcuticular stitch. IMPRESSION: Successful right IJ vein tunneled dialysis catheter with its tip in the right atrium. Electronically Signed   By: Jolaine Click M.D.   On: 07/20/2016 15:17   Ir US Guide Vasc Access Right  Result Date: 07/20/2016 INDICATION: Renal failure EXAM: TUNNELED DIALYSIS CATHETER PLACEMENT, ULTRASOUND GUIDANCE FOR VASCULAR ACCESS MEDICATIONS: Ancef; The antibiotic was administered within an appropriate time interval prior to skin puncture. ANESTHESIA/SEDATION: Versed 4 mg IV; Fentanyl 100 mcg IV; Moderate Sedation Time:  20 The patient was continuously monitored during the procedure by the interventional radiology nurse under my direct supervision. FLUOROSCOPY TIME:  Fluoroscopy Time:  minutes 42 seconds (3 mGy). COMPLICATIONS: None immediate. PROCEDURE: Informed written consent was obtained from the patient after a thorough discussion of the procedural risks, benefits and alternatives. All questions were addressed. Maximal Sterile Barrier Technique was utilized including caps, mask, sterile gowns, sterile gloves, sterile drape, hand hygiene and skin antiseptic. A timeout was performed prior to the initiation of the procedure. The right neck was prepped with ChloraPrep in a sterile fashion, and a sterile drape was applied covering the operative field. A sterile gown and sterile gloves were used for the procedure. 1% lidocaine into the skin and subcutaneous tissue. The jugular vein was noted to be patent initially with ultrasound. Under sonographic guidance, a micropuncture needle was inserted into the right IJ vein (Ultrasound and fluoroscopic image documentation was performed). It was removed over an 018 wire which was up-sized to an Amplatz. This was advanced into the IVC. A small incision was made in the right upper chest. The tunneling device was utilized to advance the 23 centimeter tip to cuff catheter from the chest incision and out the neck incision. A  peel-away sheath was advanced over the Amplatz wire. The leading edge of the catheter was then advanced through the peel-away sheath. The peel-away sheath was removed. It was flushed and instilled with heparin. The chest incision was closed with a 0 Prolene pursestring stitch. The neck incision was closed with a 4-0 Vicryl subcuticular stitch. IMPRESSION: Successful right IJ vein tunneled dialysis catheter with its tip in the right atrium. Electronically Signed   By: Jolaine Click M.D.   On: 07/20/2016 15:17   Dg Chest Port 1 View  Result Date: 07/14/2016 CLINICAL DATA:  Central line placement EXAM: PORTABLE CHEST 1 VIEW COMPARISON:  07/12/2016 FINDINGS: Endotracheal tube has been removed. Left-sided central venous catheter tip overlies the distal SVC. Right sided central venous catheter tip also overlies SVC. Esophageal tube has been removed. Mildly low lung volumes. No focal consolidation or effusion. Normal heart size. No pneumothorax. IMPRESSION: Removal of endotracheal and esophageal tubes. Tips of the central venous catheters overlie the SVC. Low lung volume but no pneumothorax or infiltrate. Electronically Signed   By: Selena Batten  Jake Samples M.D.   On: 07/14/2016 00:21   Dg Chest Port 1 View  Result Date: 07/12/2016 CLINICAL DATA:  Acute respiratory failure EXAM: PORTABLE CHEST 1 VIEW COMPARISON:  07/11/2016 and prior radiographs FINDINGS: An endotracheal tube with tip 4 cm above the carina, NG tube entering stomach with tip off the field of view, right central venous catheter with tip overlying the mid SVC and left IJ central venous catheter with tip overlying the lower SVC again noted. This is a low volume film with mild bibasilar atelectasis again noted. There is no evidence of pneumothorax or definite pleural effusion. IMPRESSION: Unchanged appearance of the chest. Electronically Signed   By: Harmon Pier M.D.   On: 07/12/2016 07:11   Dg Chest Port 1 View  Result Date: 07/11/2016 CLINICAL DATA:  Hypoxia  EXAM: PORTABLE CHEST 1 VIEW COMPARISON:  July 10, 2016 FINDINGS: Endotracheal tube tip is 4.1 cm above the carina. The left jugular catheter tip is at the cavoatrial junction. Right jugular catheter tip is in the superior cava near the cavoatrial junction. Nasogastric tube tip and side port are below the diaphragm. No pneumothorax. There is mild atelectasis in the right base. Lungs elsewhere clear. Heart size and pulmonary vascularity are normal. No adenopathy. No bone lesions. IMPRESSION: Tube and catheter positions as described without evident pneumothorax. Right base atelectasis. Lungs elsewhere clear. Stable cardiac silhouette. Electronically Signed   By: Bretta Bang III M.D.   On: 07/11/2016 07:04   Dg Chest Port 1 View  Result Date: 07/10/2016 CLINICAL DATA:  Respiratory failure. EXAM: PORTABLE CHEST 1 VIEW COMPARISON:  07/09/2016 . FINDINGS: Endotracheal tube, NG tube, bilateral IJ lines in stable position. Heart size normal. Low lung volumes with mild basilar atelectasis. No change from prior exam. No pleural effusion or pneumothorax. IMPRESSION: 1. Lines and tubes stable position. 2. Low lung volumes with mild basilar atelectasis. No change from prior exam. Electronically Signed   By: Maisie Fus  Register   On: 07/10/2016 06:43   Dg Chest Port 1 View  Result Date: 07/09/2016 CLINICAL DATA:  Status post left catheter placement. EXAM: PORTABLE CHEST 1 VIEW COMPARISON:  Radiograph of July 09, 2016. FINDINGS: Stable cardiomediastinal silhouette. Endotracheal and orogastric tubes are unchanged in position. Right internal jugular catheter is unchanged. Mild bibasilar subsegmental atelectasis is now noted. Interval placement of left internal jugular dialysis catheter with distal tip in expected position of cavoatrial junction. No pneumothorax or pleural effusion is noted. IMPRESSION: Mild bibasilar subsegmental atelectasis. Interval placement of left internal jugular dialysis catheter with distal tip in  expected position of cavoatrial junction. No pneumothorax is noted. Electronically Signed   By: Lupita Raider, M.D.   On: 07/09/2016 12:28   Dg Chest Port 1 View  Result Date: 07/09/2016 CLINICAL DATA:  Endotracheal tube and central line placement. EXAM: PORTABLE CHEST 1 VIEW COMPARISON:  None. FINDINGS: The heart size and mediastinal contours are within normal limits. No pneumothorax or pleural effusion is noted. Minimal right basilar subsegmental atelectasis is noted. Right internal jugular catheter is noted with distal tip in expected position of the SVC. Endotracheal tube is projected over tracheal air shadow with distal tip 6 cm above the carina. Orogastric tube tip is seen in proximal stomach. The visualized skeletal structures are unremarkable. IMPRESSION: Endotracheal tube, orogastric tube and right internal jugular catheter in grossly good position. No pneumothorax is noted. Minimal right basilar subsegmental atelectasis. Electronically Signed   By: Lupita Raider, M.D.   On: 07/09/2016 08:44  Time Spent in minutes  30   Pearson Grippe M.D on 08/01/2016 at 7:02 AM  Between 7am to 7pm - Pager - [phone]  After 7pm go to [email or web address] - password [facility]  [facility] -  Office  [phone]

## 2016-08-01 NOTE — Progress Notes (Signed)
Assessment/Plan: 1 Acute non-oliguric kidney injury: secondary to rhabdo. High output recovery. Increase IVF.Ask IR toRemove TDC 2 Drug OD: unintentional/chemical addiction- psych has seen 3. Urethral trauma: per GU needs a Foley catheter for 4 weeks 4.  Hypovolemia due to high output  Subjective: Interval History: orthostatic dizziness  Objective: Vital signs in last 24 hours: Temp:  [98 F (36.7 C)-98.5 F (36.9 C)] 98.5 F (36.9 C) (06/30 0930) Pulse Rate:  [60-81] 61 (06/30 0930) Resp:  [18] 18 (06/30 0930) BP: (121-128)/(57-100) 123/74 (06/30 0930) SpO2:  [99 %-100 %] 100 % (06/30 0930) Weight change:   Intake/Output from previous day: 06/29 0701 - 06/30 0700 In: 4020.4 [P.O.:600; I.V.:2470.4; IV Piggyback:110] Out: 5950 [Urine:5950] Intake/Output this shift: No intake/output data recorded.  Unchanged exam, face more gaunt  Lab Results:  Recent Labs  07/30/16 0431  WBC 6.8  HGB 7.6*  HCT 24.2*  PLT 392   BMET:  Recent Labs  07/31/16 0759 08/01/16 0401  NA 138 137  K 3.8 3.5  CL 109 109  CO2 21* 20*  GLUCOSE 92 96  BUN 62* 50*  CREATININE 5.33* 4.08*  CALCIUM 9.4 9.4   No results for input(s): PTH in the last 72 hours. Iron Studies: No results for input(s): IRON, TIBC, TRANSFERRIN, FERRITIN in the last 72 hours. Studies/Results: No results found.  Scheduled: . darbepoetin (ARANESP) injection - NON-DIALYSIS  150 mcg Subcutaneous Q Thu-1800  . docusate sodium  100 mg Oral BID  . feeding supplement (NEPRO CARB STEADY)  237 mL Oral BID BM  . heparin  5,000 Units Subcutaneous Q8H  . methadone  10 mg Oral Daily  . neomycin-bacitracin-polymyxin   Topical BID  . sodium chloride flush  10-40 mL Intracatheter Q12H     LOS: 23 days   Blake Kramer C 08/01/2016,10:27 AM

## 2016-08-01 NOTE — Procedures (Signed)
Dialysis catheter removed with traction.  Minimal blood loss and no immediate complication.

## 2016-08-02 DIAGNOSIS — N3289 Other specified disorders of bladder: Secondary | ICD-10-CM

## 2016-08-02 LAB — COMPREHENSIVE METABOLIC PANEL
ALBUMIN: 3.2 g/dL — AB (ref 3.5–5.0)
ALT: 30 U/L (ref 17–63)
ANION GAP: 9 (ref 5–15)
AST: 24 U/L (ref 15–41)
Alkaline Phosphatase: 80 U/L (ref 38–126)
BILIRUBIN TOTAL: 0.5 mg/dL (ref 0.3–1.2)
BUN: 34 mg/dL — ABNORMAL HIGH (ref 6–20)
CHLORIDE: 106 mmol/L (ref 101–111)
CO2: 22 mmol/L (ref 22–32)
Calcium: 9.4 mg/dL (ref 8.9–10.3)
Creatinine, Ser: 2.94 mg/dL — ABNORMAL HIGH (ref 0.61–1.24)
GFR calc Af Amer: 34 mL/min — ABNORMAL LOW (ref >60)
GFR, EST NON AFRICAN AMERICAN: 29 mL/min — AB (ref >60)
Glucose, Bld: 91 mg/dL (ref 65–99)
POTASSIUM: 3.5 mmol/L (ref 3.5–5.1)
Sodium: 137 mmol/L (ref 135–145)
TOTAL PROTEIN: 6.4 g/dL — AB (ref 6.5–8.1)

## 2016-08-02 LAB — BASIC METABOLIC PANEL
ANION GAP: 6 (ref 5–15)
BUN: 32 mg/dL — ABNORMAL HIGH (ref 6–20)
CHLORIDE: 107 mmol/L (ref 101–111)
CO2: 23 mmol/L (ref 22–32)
Calcium: 9 mg/dL (ref 8.9–10.3)
Creatinine, Ser: 2.78 mg/dL — ABNORMAL HIGH (ref 0.61–1.24)
GFR calc Af Amer: 36 mL/min — ABNORMAL LOW (ref >60)
GFR, EST NON AFRICAN AMERICAN: 31 mL/min — AB (ref >60)
Glucose, Bld: 91 mg/dL (ref 65–99)
POTASSIUM: 3.5 mmol/L (ref 3.5–5.1)
SODIUM: 136 mmol/L (ref 135–145)

## 2016-08-02 LAB — CBC
HEMATOCRIT: 25.4 % — AB (ref 39.0–52.0)
Hemoglobin: 8 g/dL — ABNORMAL LOW (ref 13.0–17.0)
MCH: 29.5 pg (ref 26.0–34.0)
MCHC: 31.5 g/dL (ref 30.0–36.0)
MCV: 93.7 fL (ref 78.0–100.0)
PLATELETS: 295 10*3/uL (ref 150–400)
RBC: 2.71 MIL/uL — ABNORMAL LOW (ref 4.22–5.81)
RDW: 15 % (ref 11.5–15.5)
WBC: 5 10*3/uL (ref 4.0–10.5)

## 2016-08-02 MED ORDER — BISACODYL 5 MG PO TBEC
10.0000 mg | DELAYED_RELEASE_TABLET | Freq: Once | ORAL | Status: AC
  Administered 2016-08-02: 10 mg via ORAL
  Filled 2016-08-02: qty 2

## 2016-08-02 NOTE — Progress Notes (Signed)
Assessment/Plan: 1 Acute non-oliguric kidney injury: secondary to rhabdo. High output recovery. Increase IVF. 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 Urethral trauma: per GU needs a Foley catheter for 4 weeks 4.  Hypovolemia due to high output, will check orthostatic BPs and cont IVFs fluid as needed.  Subjective: Interval History: TDC removed yesterday by IR. 4500cc UOP  Objective: Vital signs in last 24 hours: Temp:  [98.6 F (37 C)-98.9 F (37.2 C)] 98.9 F (37.2 C) (07/01 0518) Pulse Rate:  [59-60] 60 (07/01 0518) Resp:  [18] 18 (07/01 0518) BP: (111-121)/(57-59) 111/59 (07/01 0518) SpO2:  [99 %-100 %] 99 % (07/01 0518) Weight:  [96.2 kg (212 lb)-96.2 kg (212 lb 1.3 oz)] 96.2 kg (212 lb 1.3 oz) (07/01 0500) Weight change:   Intake/Output from previous day: 06/30 0701 - 07/01 0700 In: 5120 [P.O.:720; I.V.:4290; IV Piggyback:110] Out: 4550 [Urine:4550] Intake/Output this shift: No intake/output data recorded.  General appearance: alert and cooperative  ATNC EOMI Lungs clear Abd soft Ext Tr edema  Lab Results:  Recent Labs  08/02/16 0656  WBC 5.0  HGB 8.0*  HCT 25.4*  PLT 295   BMET:  Recent Labs  08/01/16 0401 08/02/16 0656  NA 137 137  K 3.5 3.5  CL 109 106  CO2 20* 22  GLUCOSE 96 91  BUN 50* 34*  CREATININE 4.08* 2.94*  CALCIUM 9.4 9.4   No results for input(s): PTH in the last 72 hours. Iron Studies: No results for input(s): IRON, TIBC, TRANSFERRIN, FERRITIN in the last 72 hours. Studies/Results: Ir Removal Tun Cv Cath W/o Fl  Result Date: 08/01/2016 INDICATION: 21 year old with history of acute kidney injury. Tunneled dialysis catheter is no longer needed. EXAM: REMOVAL TUNNELED CENTRAL VENOUS CATHETER MEDICATIONS: None ANESTHESIA/SEDATION: None FLUOROSCOPY TIME:  None COMPLICATIONS: None immediate. PROCEDURE: Informed written consent was obtained from the patient after a thorough discussion of the procedural  risks, benefits and alternatives. All questions were addressed. Retention suture was removed. Heparin was removed from the lumens. The catheter was easily removed with traction. Dressing placed over the skin exit site. IMPRESSION: Successful catheter removal as described above. Electronically Signed   By: Richarda OverlieAdam  Henn M.D.   On: 08/01/2016 11:16    Scheduled: . darbepoetin (ARANESP) injection - NON-DIALYSIS  150 mcg Subcutaneous Q Thu-1800  . docusate sodium  100 mg Oral BID  . feeding supplement (NEPRO CARB STEADY)  237 mL Oral BID BM  . heparin  5,000 Units Subcutaneous Q8H  . methadone  10 mg Oral Daily  . neomycin-bacitracin-polymyxin   Topical BID  . sodium chloride flush  10-40 mL Intracatheter Q12H     LOS: 24 days   Kveon Casanas C 08/02/2016,3:36 PM

## 2016-08-02 NOTE — Progress Notes (Signed)
Patient ID: Blake Kramer, male   DOB: 12/03/1995, 21 y.o.   MRN: [id number]                                                                PROGRESS NOTE                                                                                                                                                                                                             Patient Demographics:    Blake Kramer, is a 21 y.o. male, DOB - 1995-08-25, NFA:[id number]  Admit date - 07/09/2016   Admitting Physician Kalman Shan, MD  Outpatient Primary MD for the patient is Patient, No Pcp Per  LOS - 24  Outpatient Specialists:  No chief complaint on file.      Brief Narrative  Allegedly drug user and dealer [see psych Md note 6/20] Admit by PCCM from [place] with OD, Fentanyl/morphine Intubated for airway protxn Noted Mild hypotension on admission, positive troponin, pinpoint pupils, hyperkalemia and oliguria Narcan unrespnsive Lactate 10.4, SCr 4.4, K 7.1, AST 1453, ALT 1008, AG 23, WBC 18.9, Hgb 17.8, Trop 4.48. UDS positive for opiates  Initially admitted and needed CRRT transitioning to intermittent dialysis Hospital course complicated by self discontinuation of Foley catheter causing urethral trauma necessitating urgent urological intervention and a coud catheter placement Has not yet and deemed end-stage renal disease and it appears his renal function might slowly be improving Disposition is still unclear  Temporary HD catheter removal 6/30    Subjective:    Blake Kramer today is doing well,  S/p temporary HD catheter removal yesterday, still has hematuria. Pt making good urine.   Afebrile.  Nephrology following, appreciate input.    No headache, No chest pain, No abdominal pain - No Nausea, No new weakness tingling or numbness, No Cough - SOB.    Assessment  & Plan :    Principal Problem:   Drug overdose Active Problems:   AKI (acute kidney injury) (HCC)   Acute respiratory failure  (HCC)   Urethral bleeding   Acute renal failure (ARF) (HCC)   Illicit Drug overdose - intent unclear  Pt now more alert and lucid  Psychiatry has seen and cleared him from suicidality perspective   Acute resp failure wi and pharmacy to th  hypoxia Resolved -due to OD   Urethral trauma  Repeat urethral trauma 6/19 probably secondary to heparinization in dialysis Nursing has been instructed to to get strict I's and O's on the patient, this is sometimes limited by patient's resistance to catheter flushes Patient self DC'd Foley initially 6/11 and needed urgent urological intervention and coud catheter placement At dilaysis on 6/19 experienced pain and found to be in pool of blood-this seems to have ceased Appreciate Dr. Berneice Heinrich input, felt 2/2 heparin c Dialysis?urine with clots 6/21, but seems to have resolved 6/25 US renal 6/10 rules out hydronephrosis as per Dr. Mena Goes, appreciate input plan for at least 4 wksper urology -do NOTremove Foley catheter Appreciate Dr. Elon Spanner input Off  heparin   Anemia From hematuria ? If Hgb <7 may need transfusion Repeat cbc in am  Acute oliguric kidney injury secondary to rhabdomyolysis on CRRT in the ICU not yet declared ESRD Orthostatic hypotension noted 6/25, ? 2/2 to ABLA vs vol depletion from dialysis uncertain whether he will need ongoing dialysis--Last dialysis 6/23, nephrology seems to think he may not need long-term dialysis which would be good given his drug history. He is now passing ~ 1,200 liter sof urine daily-??atn in post-oliguric phase Given IVF today 6/25 per nephro--orthostatics ++ with lows in 60 systolic--not septic May potentially be a candidate for LTAC??  Appreciate nephrology input Await CM and Sw input I spoke with physician 6/29 w his insurance company regarding his dispo.  They recommended staying inpatient in the hospital rather than LTAC and state that we can appeal.  Its unclear why they are  against LTAC, His renal function is slowly improving, and he will need very close monitoring in a safe environment where he would not be tempted to inject iv drugs resulting in endocarditis It may take a while for his renal function to improve but he may also never return to normal renal function.  IR consult removed temporary dialysis catheter 6/30    Shock Liver / Transaminitis Due to above - resolved   Acute blood loss due to urethral trauma See above Hemoglobin currently 8.2--->7.9 Might transfuse 6/26 am if hemoglobin any lower, type ans screened 6.25 iron studies 6/21 showed iron of 36 and saturation of 11 receiving EPO/IV iron per nephro Start iron feso4 325 bid   Acute encephalopathy  due to intentionaldrug overdose + sedation(Heroin and Fentanyl) - methadone 10 mg daily Have discontinued IV morphine, use roxanol for foley flushes Have discontinued psychotropics Patient cleared from this perspective of from psychiatry and now await medical disposition in terms of renal insufficiency  R>L hearing loss Etiology unclear-Hypotension, ?Precedex? Propofol D/w Dr. 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     Lab Results  Component Value Date   PLT 392 07/30/2016      Anti-infectives    Start     Dose/Rate Route Frequency Ordered Stop   07/20/16 1432  vancomycin (VANCOCIN) 1-5 GM/200ML-% IVPB    Comments:  Georgette Shell   : cabinet override      07/20/16 1432 07/20/16 1553   07/20/16 1300  vancomycin (VANCOCIN) IVPB 1000 mg/200 mL premix     1,000 mg 200  mL/hr over 60 Minutes Intravenous To ShortStay [facility]  07/20/16 1256 07/20/16 1540   07/10/16 1200  cefTRIAXone (ROCEPHIN) 1 g in dextrose 5 % 50 mL IVPB     1 g 100 mL/hr over 30 Minutes Intravenous Every 24 hours 07/10/16 1136 07/16/16 1240        Objective:   Vitals:   08/01/16 0930 08/01/16 2238 08/02/16 0500 08/02/16 0518  BP: 123/74 (!) 121/57  (!) 111/59  Pulse: 61 (!) 59  60  Resp: 18 18  18   Temp: 98.5 F (36.9 C) 98.6 F (37 C)  98.9 F (37.2 C)  TempSrc: Oral Oral  Oral  SpO2: 100% 100%  99%  Weight:  96.2 kg (212 lb) 96.2 kg (212 lb 1.3 oz)   Height:  5\' 11"  (1.803 m)      Wt Readings from Last 3 Encounters:  08/02/16 96.2 kg (212 lb 1.3 oz)     Intake/Output Summary (Last 24 hours) at 08/02/16 0546 Last data filed at 08/02/16 0511  Gross per 24 hour  Intake             4665 ml  Output             4550 ml  Net              115 ml     Physical Exam  Awake Alert, Oriented X 3, No new F.N deficits, Normal affect [place].AT,PERRAL Supple Neck,No JVD, No cervical lymphadenopathy appriciated.  Symmetrical Chest wall movement, Good air movement bilaterally, CTAB RRR,No Gallops,Rubs or new Murmurs, No Parasternal Heave +ve B.Sounds, Abd Soft, No tenderness, No organomegaly appriciated, No rebound - guarding or rigidity. No Cyanosis, Clubbing or edema, No new Rash or bruise   Foley in place , slight hematuria.  Temporarly HD catheter has been removed    Data Review:    CBC  Recent Labs Lab 07/27/16 0519 07/28/16 0448 07/30/16 0431  WBC 12.1* 10.5 6.8  HGB 7.9* 7.9* 7.6*  HCT 24.3* 24.6* 24.2*  PLT 463* 438* 392  MCV 92.7 93.5 93.4  MCH 30.2 30.0 29.3  MCHC 32.5 32.1 31.4  RDW 14.6 15.0 15.2  LYMPHSABS 2.3 2.1  --   MONOABS 0.7 0.5  --   EOSABS 0.2 0.3  --   BASOSABS 0.0 0.1  --     Chemistries   Recent Labs Lab 07/28/16 0448 07/28/16 1243 07/30/16 0431 07/31/16 0759 08/01/16 0401  NA 139 137 138 138 137  K 4.1 5.0 3.6 3.8 3.5  CL 101 102 107 109 109  CO2 22 19* 21* 21* 20*  GLUCOSE 90  90 116* 92 96  BUN 100* 103* 86* 62* 50*  CREATININE 10.78* 10.46* 7.90* 5.33* 4.08*  CALCIUM 9.5 9.1 9.1 9.4 9.4  AST  --   --  28  --   --   ALT  --   --  44  --   --   ALKPHOS  --   --  83  --   --   BILITOT  --   --  0.3  --   --    ------------------------------------------------------------------------------------------------------------------ No results for input(s): CHOL, HDL, LDLCALC, TRIG, CHOLHDL, LDLDIRECT in the last 72 hours.  No results found for: HGBA1C ------------------------------------------------------------------------------------------------------------------ No results for input(s): TSH, T4TOTAL, T3FREE, THYROIDAB in the last 72 hours.  Invalid input(s): FREET3 ------------------------------------------------------------------------------------------------------------------ No results for input(s): VITAMINB12, FOLATE, FERRITIN, TIBC, IRON, RETICCTPCT in the last 72 hours.  Coagulation profile No results for  input(s): INR, PROTIME in the last 168 hours.  No results for input(s): DDIMER in the last 72 hours.  Cardiac Enzymes No results for input(s): CKMB, TROPONINI, MYOGLOBIN in the last 168 hours.  Invalid input(s): CK ------------------------------------------------------------------------------------------------------------------ No results found for: BNP  Inpatient Medications  Scheduled Meds: . darbepoetin (ARANESP) injection - NON-DIALYSIS  150 mcg Subcutaneous Q Thu-1800  . docusate sodium  100 mg Oral BID  . feeding supplement (NEPRO CARB STEADY)  237 mL Oral BID BM  . heparin  5,000 Units Subcutaneous Q8H  . methadone  10 mg Oral Daily  . neomycin-bacitracin-polymyxin   Topical BID  . sodium chloride flush  10-40 mL Intracatheter Q12H   Continuous Infusions: . sodium chloride 200 mL/hr at 08/02/16 0116  . ferric gluconate (FERRLECIT/NULECIT) IV Stopped (08/01/16 1231)   PRN Meds:.acetaminophen, lidocaine (PF), lidocaine-prilocaine,  morphine CONCENTRATE, pentafluoroprop-tetrafluoroeth  Micro Results No results found for this or any previous visit (from the past 240 hour(s)).  Radiology Reports US Renal  Result Date: 07/23/2016 CLINICAL DATA:  Subacute onset of renal insufficiency, and hematuria. Initial encounter. EXAM: RENAL / URINARY TRACT ULTRASOUND COMPLETE COMPARISON:  Renal ultrasound performed 07/09/2016 FINDINGS: Right Kidney: Length: 15.3 cm. Echogenicity within normal limits. No mass or hydronephrosis visualized. Left Kidney: Length: 14.9 cm. Mildly increased renal parenchymal echogenicity is noted. No mass or hydronephrosis visualized. Bladder: Decompressed, with a Foley catheter in place. IMPRESSION: 1. No evidence of hydronephrosis. 2. Mildly increased renal parenchymal echogenicity may reflect medical renal disease. 3. Bladder decompressed, with a Foley catheter in place. No significant blood clot seen within the bladder, though evaluation of the bladder is limited. Electronically Signed   By: Roanna Raider M.D.   On: 07/23/2016 18:42   US Renal  Result Date: 07/09/2016 CLINICAL DATA:  Acute kidney injury EXAM: RENAL / URINARY TRACT ULTRASOUND COMPLETE COMPARISON:  None. FINDINGS: Right Kidney: Length: 12.8 cm. Increased renal cortical echogenicity. No mass or hydronephrosis visualized. Left Kidney: Length: 12.9 cm. Increased renal cortical echogenicity. No mass or hydronephrosis visualized. Bladder: Decompressed bladder with a Foley catheter present. IMPRESSION: 1. No obstructive uropathy. 2. Increased renal cortical echogenicity as can be seen with acute renal failure. Electronically Signed   By: Elige Ko   On: 07/09/2016 12:03   Ir Fluoro Guide Cv Line Right  Result Date: 07/20/2016 INDICATION: Renal failure EXAM: TUNNELED DIALYSIS CATHETER PLACEMENT, ULTRASOUND GUIDANCE FOR VASCULAR ACCESS MEDICATIONS: Ancef; The antibiotic was administered within an appropriate time interval prior to skin puncture.  ANESTHESIA/SEDATION: Versed 4 mg IV; Fentanyl 100 mcg IV; Moderate Sedation Time:  20 The patient was continuously monitored during the procedure by the interventional radiology nurse under my direct supervision. FLUOROSCOPY TIME:  Fluoroscopy Time:  minutes 42 seconds (3 mGy). COMPLICATIONS: None immediate. PROCEDURE: Informed written consent was obtained from the patient after a thorough discussion of the procedural risks, benefits and alternatives. All questions were addressed. Maximal Sterile Barrier Technique was utilized including caps, mask, sterile gowns, sterile gloves, sterile drape, hand hygiene and skin antiseptic. A timeout was performed prior to the initiation of the procedure. The right neck was prepped with ChloraPrep in a sterile fashion, and a sterile drape was applied covering the operative field. A sterile gown and sterile gloves were used for the procedure. 1% lidocaine into the skin and subcutaneous tissue. The jugular vein was noted to be patent initially with ultrasound. Under sonographic guidance, a micropuncture needle was inserted into the right IJ vein (Ultrasound and fluoroscopic image documentation was performed). It  was removed over an 018 wire which was up-sized to an Amplatz. This was advanced into the IVC. A small incision was made in the right upper chest. The tunneling device was utilized to advance the 23 centimeter tip to cuff catheter from the chest incision and out the neck incision. A peel-away sheath was advanced over the Amplatz wire. The leading edge of the catheter was then advanced through the peel-away sheath. The peel-away sheath was removed. It was flushed and instilled with heparin. The chest incision was closed with a 0 Prolene pursestring stitch. The neck incision was closed with a 4-0 Vicryl subcuticular stitch. IMPRESSION: Successful right IJ vein tunneled dialysis catheter with its tip in the right atrium. Electronically Signed   By: Jolaine ClickArthur  Hoss M.D.   On:  07/20/2016 15:17   Ir Removal Tun Cv Cath W/o Fl  Result Date: 08/01/2016 INDICATION: 21 year old with history of acute kidney injury. Tunneled dialysis catheter is no longer needed. EXAM: REMOVAL TUNNELED CENTRAL VENOUS CATHETER MEDICATIONS: None ANESTHESIA/SEDATION: None FLUOROSCOPY TIME:  None COMPLICATIONS: None immediate. PROCEDURE: Informed written consent was obtained from the patient after a thorough discussion of the procedural risks, benefits and alternatives. All questions were addressed. Retention suture was removed. Heparin was removed from the lumens. The catheter was easily removed with traction. Dressing placed over the skin exit site. IMPRESSION: Successful catheter removal as described above. Electronically Signed   By: Richarda OverlieAdam  Henn M.D.   On: 08/01/2016 11:16   Ir [place] Guide Vasc Access Right  Result Date: 07/20/2016 INDICATION: Renal failure EXAM: TUNNELED DIALYSIS CATHETER PLACEMENT, ULTRASOUND GUIDANCE FOR VASCULAR ACCESS MEDICATIONS: Ancef; The antibiotic was administered within an appropriate time interval prior to skin puncture. ANESTHESIA/SEDATION: Versed 4 mg IV; Fentanyl 100 mcg IV; Moderate Sedation Time:  20 The patient was continuously monitored during the procedure by the interventional radiology nurse under my direct supervision. FLUOROSCOPY TIME:  Fluoroscopy Time:  minutes 42 seconds (3 mGy). COMPLICATIONS: None immediate. PROCEDURE: Informed written consent was obtained from the patient after a thorough discussion of the procedural risks, benefits and alternatives. All questions were addressed. Maximal Sterile Barrier Technique was utilized including caps, mask, sterile gowns, sterile gloves, sterile drape, hand hygiene and skin antiseptic. A timeout was performed prior to the initiation of the procedure. The right neck was prepped with ChloraPrep in a sterile fashion, and a sterile drape was applied covering the operative field. A sterile gown and sterile gloves were used for  the procedure. 1% lidocaine into the skin and subcutaneous tissue. The jugular vein was noted to be patent initially with ultrasound. Under sonographic guidance, a micropuncture needle was inserted into the right IJ vein (Ultrasound and fluoroscopic image documentation was performed). It was removed over an 018 wire which was up-sized to an Amplatz. This was advanced into the IVC. A small incision was made in the right upper chest. The tunneling device was utilized to advance the 23 centimeter tip to cuff catheter from the chest incision and out the neck incision. A peel-away sheath was advanced over the Amplatz wire. The leading edge of the catheter was then advanced through the peel-away sheath. The peel-away sheath was removed. It was flushed and instilled with heparin. The chest incision was closed with a 0 Prolene pursestring stitch. The neck incision was closed with a 4-0 Vicryl subcuticular stitch. IMPRESSION: Successful right IJ vein tunneled dialysis catheter with its tip in the right atrium. Electronically Signed   By: Jolaine ClickArthur  Hoss M.D.   On: 07/20/2016 15:17  Dg Chest Port 1 View  Result Date: 07/14/2016 CLINICAL DATA:  Central line placement EXAM: PORTABLE CHEST 1 VIEW COMPARISON:  07/12/2016 FINDINGS: Endotracheal tube has been removed. Left-sided central venous catheter tip overlies the distal SVC. Right sided central venous catheter tip also overlies SVC. Esophageal tube has been removed. Mildly low lung volumes. No focal consolidation or effusion. Normal heart size. No pneumothorax. IMPRESSION: Removal of endotracheal and esophageal tubes. Tips of the central venous catheters overlie the SVC. Low lung volume but no pneumothorax or infiltrate. Electronically Signed   By: Jasmine Pang M.D.   On: 07/14/2016 00:21   Dg Chest Port 1 View  Result Date: 07/12/2016 CLINICAL DATA:  Acute respiratory failure EXAM: PORTABLE CHEST 1 VIEW COMPARISON:  07/11/2016 and prior radiographs FINDINGS: An  endotracheal tube with tip 4 cm above the carina, NG tube entering stomach with tip off the field of view, right central venous catheter with tip overlying the mid SVC and left IJ central venous catheter with tip overlying the lower SVC again noted. This is a low volume film with mild bibasilar atelectasis again noted. There is no evidence of pneumothorax or definite pleural effusion. IMPRESSION: Unchanged appearance of the chest. Electronically Signed   By: Harmon Pier M.D.   On: 07/12/2016 07:11   Dg Chest Port 1 View  Result Date: 07/11/2016 CLINICAL DATA:  Hypoxia EXAM: PORTABLE CHEST 1 VIEW COMPARISON:  July 10, 2016 FINDINGS: Endotracheal tube tip is 4.1 cm above the carina. The left jugular catheter tip is at the cavoatrial junction. Right jugular catheter tip is in the superior cava near the cavoatrial junction. Nasogastric tube tip and side port are below the diaphragm. No pneumothorax. There is mild atelectasis in the right base. Lungs elsewhere clear. Heart size and pulmonary vascularity are normal. No adenopathy. No bone lesions. IMPRESSION: Tube and catheter positions as described without evident pneumothorax. Right base atelectasis. Lungs elsewhere clear. Stable cardiac silhouette. Electronically Signed   By: Bretta Bang III M.D.   On: 07/11/2016 07:04   Dg Chest Port 1 View  Result Date: 07/10/2016 CLINICAL DATA:  Respiratory failure. EXAM: PORTABLE CHEST 1 VIEW COMPARISON:  07/09/2016 . FINDINGS: Endotracheal tube, NG tube, bilateral IJ lines in stable position. Heart size normal. Low lung volumes with mild basilar atelectasis. No change from prior exam. No pleural effusion or pneumothorax. IMPRESSION: 1. Lines and tubes stable position. 2. Low lung volumes with mild basilar atelectasis. No change from prior exam. Electronically Signed   By: Maisie Fus  Register   On: 07/10/2016 06:43   Dg Chest Port 1 View  Result Date: 07/09/2016 CLINICAL DATA:  Status post left catheter placement. EXAM:  PORTABLE CHEST 1 VIEW COMPARISON:  Radiograph of July 09, 2016. FINDINGS: Stable cardiomediastinal silhouette. Endotracheal and orogastric tubes are unchanged in position. Right internal jugular catheter is unchanged. Mild bibasilar subsegmental atelectasis is now noted. Interval placement of left internal jugular dialysis catheter with distal tip in expected position of cavoatrial junction. No pneumothorax or pleural effusion is noted. IMPRESSION: Mild bibasilar subsegmental atelectasis. Interval placement of left internal jugular dialysis catheter with distal tip in expected position of cavoatrial junction. No pneumothorax is noted. Electronically Signed   By: Lupita Raider, M.D.   On: 07/09/2016 12:28   Dg Chest Port 1 View  Result Date: 07/09/2016 CLINICAL DATA:  Endotracheal tube and central line placement. EXAM: PORTABLE CHEST 1 VIEW COMPARISON:  None. FINDINGS: The heart size and mediastinal contours are within normal limits. No pneumothorax  or pleural effusion is noted. Minimal right basilar subsegmental atelectasis is noted. Right internal jugular catheter is noted with distal tip in expected position of the SVC. Endotracheal tube is projected over tracheal air shadow with distal tip 6 cm above the carina. Orogastric tube tip is seen in proximal stomach. The visualized skeletal structures are unremarkable. IMPRESSION: Endotracheal tube, orogastric tube and right internal jugular catheter in grossly good position. No pneumothorax is noted. Minimal right basilar subsegmental atelectasis. Electronically Signed   By: Lupita Raider, M.D.   On: 07/09/2016 08:44    Time Spent in minutes  30   Pearson Grippe M.D on 08/02/2016 at 5:46 AM  Between 7am to 7pm - Pager - [phone]  After 7pm go to [email or web address] - password [facility]  [facility] -  Office  [phone]

## 2016-08-02 NOTE — Progress Notes (Signed)
Continue to urinate around the cathter. Reddish and bloody at times. No urine collected from the bag. Flushed twice this shift. Catheter flushes well but unable to retrieve normal saline flush when pulling back. Bladder scanned 90 cc in his bladder. On and bladder spasms and patient have the urge to urinate. Will continue to monitor.

## 2016-08-02 NOTE — Progress Notes (Signed)
Patient was claiming earlier that he had a difficulty of urinating and had a long strand of blood clots inspite of draining of 2200 ml of cherry punch colored urine,i have not found any single strand of blood clots in his urine from his foley catheter bag.Medicated patient and flushed his foley catheter and flushing went in smoothly ,no resistant when flushing.

## 2016-08-03 MED ORDER — FERROUS SULFATE 325 (65 FE) MG PO TABS
325.0000 mg | ORAL_TABLET | Freq: Two times a day (BID) | ORAL | Status: DC
  Administered 2016-08-03 – 2016-08-06 (×6): 325 mg via ORAL
  Filled 2016-08-03 (×6): qty 1

## 2016-08-03 MED ORDER — PREDNISONE 20 MG PO TABS
40.0000 mg | ORAL_TABLET | Freq: Every day | ORAL | Status: DC
  Administered 2016-08-03 – 2016-08-05 (×3): 40 mg via ORAL
  Filled 2016-08-03 (×3): qty 2

## 2016-08-03 MED ORDER — PREDNISONE 20 MG PO TABS: 40.0000 mg | ORAL_TABLET | Freq: Every day | ORAL | Status: DC

## 2016-08-03 MED ORDER — BELLADONNA ALKALOIDS-OPIUM 16.2-60 MG RE SUPP: 1.0000 | Freq: Three times a day (TID) | RECTAL | Status: DC | PRN

## 2016-08-03 NOTE — Progress Notes (Addendum)
Patient ID: Blake Kramer, male   DOB: 28-Sep-1995, 21 y.o.   MRN: [id number] S: Had trouble with his Foley last night, s/p flushing and has large amounts of blood clots in foley this am.  Feeling better O:BP 118/67 (BP Location: Left Arm)   Pulse 80   Temp 98.2 F (36.8 C) (Oral)   Resp 18   Ht 5\' 11"  (1.803 m)   Wt 96.1 kg (211 lb 12.8 oz)   SpO2 100%   BMI 29.54 kg/m   Intake/Output Summary (Last 24 hours) at 08/03/16 0915 Last data filed at 08/03/16 0600  Gross per 24 hour  Intake             1840 ml  Output             2880 ml  Net            -1040 ml   Intake/Output: I/O last 3 completed shifts: In: 6480 [P.O.:420; I.V.:5860; Other:90; IV Piggyback:110] Out: 7430 [Urine:7430]  Intake/Output this shift:  No intake/output data recorded. Weight change:  Gen:WD WN WM in NAD CVS:no rub Resp:cta JXB:JYNWGNAbd:benign Ext:no edema   Recent Labs Lab 07/28/16 0448 07/28/16 1243 07/30/16 0431 07/31/16 0759 08/01/16 0401 08/02/16 0656 08/02/16 1549  NA 139 137 138 138 137 137 136  K 4.1 5.0 3.6 3.8 3.5 3.5 3.5  CL 101 102 107 109 109 106 107  CO2 22 19* 21* 21* 20* 22 23  GLUCOSE 90 90 116* 92 96 91 91  BUN 100* 103* 86* 62* 50* 34* 32*  CREATININE 10.78* 10.46* 7.90* 5.33* 4.08* 2.94* 2.78*  ALBUMIN 3.1*  --  3.1*  --   --  3.2*  --   CALCIUM 9.5 9.1 9.1 9.4 9.4 9.4 9.0  PHOS 7.7*  --   --   --   --   --   --   AST  --   --  28  --   --  24  --   ALT  --   --  44  --   --  30  --    Liver Function Tests:  Recent Labs Lab 07/28/16 0448 07/30/16 0431 08/02/16 0656  AST  --  28 24  ALT  --  44 30  ALKPHOS  --  83 80  BILITOT  --  0.3 0.5  PROT  --  6.2* 6.4*  ALBUMIN 3.1* 3.1* 3.2*   No results for input(s): LIPASE, AMYLASE in the last 168 hours. No results for input(s): AMMONIA in the last 168 hours. CBC:  Recent Labs Lab 07/28/16 0448 07/30/16 0431 08/02/16 0656  WBC 10.5 6.8 5.0  NEUTROABS 7.6  --   --   HGB 7.9* 7.6* 8.0*  HCT 24.6* 24.2* 25.4*  MCV  93.5 93.4 93.7  PLT 438* 392 295   Cardiac Enzymes: No results for input(s): CKTOTAL, CKMB, CKMBINDEX, TROPONINI in the last 168 hours. CBG: No results for input(s): GLUCAP in the last 168 hours.  Iron Studies: No results for input(s): IRON, TIBC, TRANSFERRIN, FERRITIN in the last 72 hours. Studies/Results: Ir Removal Tun Cv Cath W/o Fl  Result Date: 08/01/2016 INDICATION: 21 year old with history of acute kidney injury. Tunneled dialysis catheter is no longer needed. EXAM: REMOVAL TUNNELED CENTRAL VENOUS CATHETER MEDICATIONS: None ANESTHESIA/SEDATION: None FLUOROSCOPY TIME:  None COMPLICATIONS: None immediate. PROCEDURE: Informed written consent was obtained from the patient after a thorough discussion of the procedural risks, benefits and alternatives. All questions were addressed. Retention suture  was removed. Heparin was removed from the lumens. The catheter was easily removed with traction. Dressing placed over the skin exit site. IMPRESSION: Successful catheter removal as described above. Electronically Signed   By: Richarda Overlie M.D.   On: 08/01/2016 11:16   . darbepoetin (ARANESP) injection - NON-DIALYSIS  150 mcg Subcutaneous Q Thu-1800  . docusate sodium  100 mg Oral BID  . feeding supplement (NEPRO CARB STEADY)  237 mL Oral BID BM  . ferrous sulfate  325 mg Oral BID WC  . heparin  5,000 Units Subcutaneous Q8H  . methadone  10 mg Oral Daily  . neomycin-bacitracin-polymyxin   Topical BID  . sodium chloride flush  10-40 mL Intracatheter Q12H    BMET    Component Value Date/Time   NA 136 08/02/2016 1549   K 3.5 08/02/2016 1549   CL 107 08/02/2016 1549   CO2 23 08/02/2016 1549   GLUCOSE 91 08/02/2016 1549   BUN 32 (H) 08/02/2016 1549   CREATININE 2.78 (H) 08/02/2016 1549   CALCIUM 9.0 08/02/2016 1549   GFRNONAA 31 (L) 08/02/2016 1549   GFRAA 36 (L) 08/02/2016 1549   CBC    Component Value Date/Time   WBC 5.0 08/02/2016 0656   RBC 2.71 (L) 08/02/2016 0656   HGB 8.0 (L)  08/02/2016 0656   HCT 25.4 (L) 08/02/2016 0656   PLT 295 08/02/2016 0656   MCV 93.7 08/02/2016 0656   MCH 29.5 08/02/2016 0656   MCHC 31.5 08/02/2016 0656   RDW 15.0 08/02/2016 0656   LYMPHSABS 2.1 07/28/2016 0448   MONOABS 0.5 07/28/2016 0448   EOSABS 0.3 07/28/2016 0448   BASOSABS 0.1 07/28/2016 0448     Assessment/Plan:  1. AKI due to rhabdomyolysis- improving with IVF's and had high output recovery phase but seems to be slowing down.   2. Rhabdomyolysis- due to heroin overdose.  Improving with ivf's 3. Heroin addiction with overdose, apparently unintentional.  Psych has seen 4. Hematuria- due to urethral trauma- s/p flushing and B&O supp to help with bladder spasms. 5. Anemia- follow. 6. HD catheter removed and appreciate assistance of Dr. Lowella Dandy.  Irena Cords, MD [employer] [phone]

## 2016-08-03 NOTE — Progress Notes (Signed)
I have ordered B&O suppositories for patient's bladder spasms.

## 2016-08-03 NOTE — Progress Notes (Signed)
Physical Therapy Treatment Patient Details Name: Blake BoutonLindon Kramer MRN: [id number] DOB: 1995/05/11 Today's Date: 08/03/2016    History of Present Illness Pt adm with drug overdose. UDS positive for opiates. Pt intubated 6/7 and self extubated 6/11. Pt with AKI and required CRRT which was stopped 6/14. To start HD on 6/16.  Pt pulled out foley with significant trauma. Also pulled out HD cath. No PMH.(per chart 10+ overdoses)    PT Comments    Continuing work on functional mobility and activity tolerance;  Able to get Orthostatic BP readings this session, and Blake Kramer was positive for hypotension with upright activity; we discussed the need for spending more time during the day in more upright positioning, eating meals OOB in the recliner, etc; see below or on doc flowsheets for serial BPs   Follow Up Recommendations  LTACH;Other (comment) (versus [facility], depending on Medical needs)     Equipment Recommendations  Rolling walker with 5" wheels    Recommendations for Other Services       Precautions / Restrictions Precautions Precaution Comments: Foley due to significant urethral trauma    Mobility  Bed Mobility Overal bed mobility: Needs Assistance Bed Mobility: Supine to Sit     Supine to sit: Supervision;HOB elevated     General bed mobility comments: HOB elevated, use of bed rail. increased time required but no physical assist  Transfers Overall transfer level: Needs assistance Equipment used: Rolling walker (2 wheeled) Transfers: Sit to/from Stand Sit to Stand: Min guard         General transfer comment: Min guard for safety. Hand placement good  Ambulation/Gait Ambulation/Gait assistance: Min guard Ambulation Distance (Feet): 80 Feet Assistive device: Rolling walker (2 wheeled) Gait Pattern/deviations: Step-through pattern;Wide base of support;Trunk flexed;Shuffle;Decreased stride length Gait velocity: reduced   General Gait Details: Cues to  self-monitor for activity tolerance; noted somewhat pale and slower to answer questions during walk, which did coincide with the BP drop; used a men's Depend pad and mesh brief due to some bleeding from penis   Stairs            Wheelchair Mobility    Modified Rankin (Stroke Patients Only)       Balance     Sitting balance-Leahy Scale: Good       Standing balance-Leahy Scale: Fair                              Cognition Arousal/Alertness: Awake/alert Behavior During Therapy: WFL for tasks assessed/performed Overall Cognitive Status: Within Functional Limits for tasks assessed                                        Exercises      General Comments General comments (skin integrity, edema, etc.):   08/03/16 1445 08/03/16 1452 08/03/16 1504  Orthostatic Lying   BP- Lying 119/66 --  --   Pulse- Lying 90 --  --   Orthostatic Sitting  BP- Sitting (!) 79/33 98/64 93/67   Pulse- Sitting 128 123 147  Orthostatic Standing at 0 minutes  BP- Standing at 0 minutes --  90/45 --   Pulse- Standing at 0 minutes --  110 --   Orthostatic Standing at 3 minutes  BP- Standing at 3 minutes --  (!) 83/28 (post brief hallway ambulation) --   Pulse- Standing at 3 minutes --  148 --          Pertinent Vitals/Pain Pain Assessment: Faces Faces Pain Scale: Hurts a little bit Pain Location: groin penis Pain Descriptors / Indicators: Grimacing;Guarding Pain Intervention(s): Monitored during session    Home Living                      Prior Function            PT Goals (current goals can now be found in the care plan section) Acute Rehab PT Goals Patient Stated Goal: decrease pain and go home PT Goal Formulation: With patient Time For Goal Achievement: 08/15/16 Potential to Achieve Goals: Good Progress towards PT goals: Progressing toward goals    Frequency    Min 3X/week      PT Plan Current plan remains appropriate     Co-evaluation              AM-PAC PT "6 Clicks" Daily Activity  Outcome Measure  Difficulty turning over in bed (including adjusting bedclothes, sheets and blankets)?: A Little Difficulty moving from lying on back to sitting on the side of the bed? : A Lot Difficulty sitting down on and standing up from a chair with arms (e.g., wheelchair, bedside commode, etc,.)?: A Lot Help needed moving to and from a bed to chair (including a wheelchair)?: A Little Help needed walking in hospital room?: A Little Help needed climbing 3-5 steps with a railing? : A Little 6 Click Score: 16    End of Session Equipment Utilized During Treatment: Gait belt Activity Tolerance: Patient tolerated treatment well;Other (comment) (though positive for orthostasis) Patient left: in bed;with call bell/phone within reach;Other (comment) (sitting EOB) Nurse Communication: Mobility status PT Visit Diagnosis: Difficulty in walking, not elsewhere classified (R26.2);Pain     Time: 1441-1511 PT Time Calculation (min) (ACUTE ONLY): 30 min  Charges:  $Gait Training: 23-37 mins                    G Codes:       Van Clines, [place]  Acute [facility] Services Pager [phone] Office [phone]    Levi Aland 08/03/2016, 4:03 PM

## 2016-08-03 NOTE — Progress Notes (Signed)
Patient ID: Blake Kramer, male   DOB: 02-Jun-1995, 21 y.o.   MRN: [id number]                                                                PROGRESS NOTE                                                                                                                                                                                                             Patient Demographics:    Blake Kramer, is a 21 y.o. male, DOB - 1996/02/02, NWG:[id number]  Admit date - 07/09/2016   Admitting Physician Kalman Shan, MD  Outpatient Primary MD for the patient is Patient, No Pcp Per  LOS - 25  Outpatient Specialists:  No chief complaint on file.      Brief Narrative  Allegedly drug user and dealer [see psych Md note 6/20] Admit by PCCM from [facility] with OD, Fentanyl/morphine Intubated for airway protection.  Noted Mild hypotension on admission, positive troponin, pinpoint pupils, hyperkalemia and oliguria Narcan unrespnsive Lactate 10.4, SCr 4.4, K 7.1, AST 1453, ALT 1008, AG 23, WBC 18.9, Hgb 17.8, Trop 4.48. UDS positive for opiates  Initially admitted and needed CRRT transitioning to intermittent dialysis Hospital course complicated by self discontinuation of Foley catheter causing urethral trauma necessitating urgent urological intervention and a coud catheter placement Has not yet and deemed end-stage renal disease and it appears his renal function might slowly be improving Disposition is still unclear  Temporary HD catheter removal 6/30    Subjective:    Blake Kramer today is doing well, complaints of hearing loss. Now he said he notice some improvement of hearing with prednisone.  Had problems last night with catheter. It was flush.    Assessment  & Plan :    Principal Problem:   Drug overdose Active Problems:   AKI (acute kidney injury) (HCC)   Acute respiratory failure (HCC)   Urethral bleeding   Acute renal failure (ARF) (HCC)   Blood clot in bladder   Illicit Drug  overdose - intent unclear  Pt now more alert and lucid  Psychiatry has seen and cleared him from suicidality perspective  SW consulted, need rehab for substance abuse.   Acute resp failure wi and pharmacy to th hypoxia Resolved -due to OD  Urethral trauma  Repeat urethral trauma 6/19 probably secondary to heparinization in dialysis Patient self DC'd Foley initially 6/11 and needed urgent urological intervention and coud catheter placement At dilaysis on 6/19 experienced pain and found to be in pool of blood-this seems to have ceased Appreciate Dr. Berneice Heinrich input, felt 2/2 heparin c Dialysis?urine with clots 6/21, but seems to have resolved 6/25 US renal 6/10 rules out hydronephrosis as per Dr. Mena Goes, appreciate input plan for at least 4 wksper urology -do NOTremove Foley catheter             Appreciate Dr. Elon Spanner inp.             Off  heparin              Foley flush this am.              Dc heparin for dvt prophylaxis. scd ordered.   Anemia From hematuria ? If Hgb <7 may need transfusion Repeat cbc in am start ferrous sulfate.   Acute oliguric kidney injury secondary to rhabdomyolysis on CRRT in the ICU not yet declared ESRD Orthostatic hypotension noted 6/25, ? 2/2 to ABLA vs vol depletion from dialysis uncertain whether he will need ongoing dialysis--Last dialysis 6/23, nephrology seems to think he may not need long-term dialysis which would be good given his drug history. He is now passing ~ 1,200 liter sof urine daily-??atn in post-oliguric phase Appreciate nephrology input Await CM and Sw input Urine out put 2.8 L today. Plan to continue IV fluids. Follow renal recommendation.  IR  removed temporary dialysis catheter 6/30    Shock Liver / Transaminitis Due to above - resolved   Acute blood loss due to urethral trauma See above Hemoglobin currently 8.2--->7.9--8.0 iron studies 6/21 showed iron of 36 and saturation of 11 receiving EPO/IV  iron per nephro Start iron feso4 325 bid   Acute encephalopathy  due to intentionaldrug overdose + sedation(Heroin and Fentanyl) - methadone 10 mg daily Have discontinued IV morphine, use roxanol for foley flushes Have discontinued psychotropics Patient cleared from this perspective of from psychiatry and now await medical disposition in terms of renal insufficiency  R>L hearing loss Etiology unclear-Hypotension, ?Precedex? Propofol D/w Dr. 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 Patient now report some improvement. I will resume prednisone. , need taper.  He will need OP follow up with ENT on d/c  Inpatient;  Follow renal function.  PT evaluation.  SW to help with drug rehab.      Lab Results  Component Value Date   PLT 295 08/02/2016      Anti-infectives    Start     Dose/Rate Route Frequency Ordered Stop   07/20/16 1432  vancomycin (VANCOCIN) 1-5 GM/200ML-% IVPB    Comments:  Georgette Shell   : cabinet override      07/20/16 1432 07/20/16 1553   07/20/16 1300  vancomycin (VANCOCIN) IVPB 1000 mg/200 mL premix     1,000 mg 200 mL/hr over 60 Minutes Intravenous To [facility] 07/20/16 1256 07/20/16 1540   07/10/16 1200  cefTRIAXone (ROCEPHIN) 1 g in dextrose 5 % 50 mL IVPB     1 g 100 mL/hr over 30 Minutes Intravenous Every 24 hours 07/10/16 1136 07/16/16 1240        Objective:   Vitals:   08/02/16 2151 08/03/16 0452 08/03/16 0822 08/03/16 1000  BP:  114/68 118/67  112/65  Pulse: 78 80  84  Resp: 18 18  18   Temp: 97.9 F (36.6 C) 98.2 F (36.8 C)  97.8 F (36.6 C)  TempSrc: Oral Oral  Oral  SpO2: 100% 100%  99%  Weight:   96.1 kg (211 lb 12.8 oz)   Height:        Wt Readings from Last 3 Encounters:  08/03/16 96.1 kg (211 lb 12.8 oz)     Intake/Output Summary (Last 24 hours) at  08/03/16 1247 Last data filed at 08/03/16 1000  Gross per 24 hour  Intake             1840 ml  Output             3630 ml  Net            -1790 ml     Physical Exam  General; alert, in no distress.  CVS; S 1 , S 2 RRR Supple Neck,No JVD, No cervical lymphadenopathy appriciated.  Lungs. CTA Abdomen; soft, nt, dt.  Foley in place , slight hematuria.  Temporarly HD catheter has been removed    Data Review:    CBC  Recent Labs Lab 07/28/16 0448 07/30/16 0431 08/02/16 0656  WBC 10.5 6.8 5.0  HGB 7.9* 7.6* 8.0*  HCT 24.6* 24.2* 25.4*  PLT 438* 392 295  MCV 93.5 93.4 93.7  MCH 30.0 29.3 29.5  MCHC 32.1 31.4 31.5  RDW 15.0 15.2 15.0  LYMPHSABS 2.1  --   --   MONOABS 0.5  --   --   EOSABS 0.3  --   --   BASOSABS 0.1  --   --     Chemistries   Recent Labs Lab 07/30/16 0431 07/31/16 0759 08/01/16 0401 08/02/16 0656 08/02/16 1549  NA 138 138 137 137 136  K 3.6 3.8 3.5 3.5 3.5  CL 107 109 109 106 107  CO2 21* 21* 20* 22 23  GLUCOSE 116* 92 96 91 91  BUN 86* 62* 50* 34* 32*  CREATININE 7.90* 5.33* 4.08* 2.94* 2.78*  CALCIUM 9.1 9.4 9.4 9.4 9.0  AST 28  --   --  24  --   ALT 44  --   --  30  --   ALKPHOS 83  --   --  80  --   BILITOT 0.3  --   --  0.5  --    ------------------------------------------------------------------------------------------------------------------ No results for input(s): CHOL, HDL, LDLCALC, TRIG, CHOLHDL, LDLDIRECT in the last 72 hours.  No results found for: HGBA1C ------------------------------------------------------------------------------------------------------------------ No results for input(s): TSH, T4TOTAL, T3FREE, THYROIDAB in the last 72 hours.  Invalid input(s): FREET3 ------------------------------------------------------------------------------------------------------------------ No results for input(s): VITAMINB12, FOLATE, FERRITIN, TIBC, IRON, RETICCTPCT in the last 72 hours.  Coagulation profile No results for  input(s): INR, PROTIME in the last 168 hours.  No results for input(s): DDIMER in the last 72 hours.  Cardiac Enzymes No results for input(s): CKMB, TROPONINI, MYOGLOBIN in the last 168 hours.  Invalid input(s): CK ------------------------------------------------------------------------------------------------------------------ No results found for: BNP  Inpatient Medications  Scheduled Meds: . darbepoetin (ARANESP) injection - NON-DIALYSIS  150 mcg Subcutaneous Q Thu-1800  . docusate sodium  100 mg Oral BID  . feeding supplement (NEPRO CARB STEADY)  237 mL Oral BID BM  . ferrous sulfate  325 mg Oral BID WC  . heparin  5,000 Units Subcutaneous Q8H  . methadone  10 mg Oral Daily  . neomycin-bacitracin-polymyxin   Topical BID  . [  START ON 08/04/2016] predniSONE  40 mg Oral Q breakfast  . sodium chloride flush  10-40 mL Intracatheter Q12H   Continuous Infusions: . sodium chloride 150 mL/hr at 08/03/16 1126   PRN Meds:.acetaminophen, lidocaine (PF), lidocaine-prilocaine, morphine CONCENTRATE, opium-belladonna, pentafluoroprop-tetrafluoroeth  Micro Results No results found for this or any previous visit (from the past 240 hour(s)).  Radiology Reports US Renal  Result Date: 07/23/2016 CLINICAL DATA:  Subacute onset of renal insufficiency, and hematuria. Initial encounter. EXAM: RENAL / URINARY TRACT ULTRASOUND COMPLETE COMPARISON:  Renal ultrasound performed 07/09/2016 FINDINGS: Right Kidney: Length: 15.3 cm. Echogenicity within normal limits. No mass or hydronephrosis visualized. Left Kidney: Length: 14.9 cm. Mildly increased renal parenchymal echogenicity is noted. No mass or hydronephrosis visualized. Bladder: Decompressed, with a Foley catheter in place. IMPRESSION: 1. No evidence of hydronephrosis. 2. Mildly increased renal parenchymal echogenicity may reflect medical renal disease. 3. Bladder decompressed, with a Foley catheter in place. No significant blood clot seen within the  bladder, though evaluation of the bladder is limited. Electronically Signed   By: Roanna Raider M.D.   On: 07/23/2016 18:42   US Renal  Result Date: 07/09/2016 CLINICAL DATA:  Acute kidney injury EXAM: RENAL / URINARY TRACT ULTRASOUND COMPLETE COMPARISON:  None. FINDINGS: Right Kidney: Length: 12.8 cm. Increased renal cortical echogenicity. No mass or hydronephrosis visualized. Left Kidney: Length: 12.9 cm. Increased renal cortical echogenicity. No mass or hydronephrosis visualized. Bladder: Decompressed bladder with a Foley catheter present. IMPRESSION: 1. No obstructive uropathy. 2. Increased renal cortical echogenicity as can be seen with acute renal failure. Electronically Signed   By: Elige Ko   On: 07/09/2016 12:03   Ir Fluoro Guide Cv Line Right  Result Date: 07/20/2016 INDICATION: Renal failure EXAM: TUNNELED DIALYSIS CATHETER PLACEMENT, ULTRASOUND GUIDANCE FOR VASCULAR ACCESS MEDICATIONS: Ancef; The antibiotic was administered within an appropriate time interval prior to skin puncture. ANESTHESIA/SEDATION: Versed 4 mg IV; Fentanyl 100 mcg IV; Moderate Sedation Time:  20 The patient was continuously monitored during the procedure by the interventional radiology nurse under my direct supervision. FLUOROSCOPY TIME:  Fluoroscopy Time:  minutes 42 seconds (3 mGy). COMPLICATIONS: None immediate. PROCEDURE: Informed written consent was obtained from the patient after a thorough discussion of the procedural risks, benefits and alternatives. All questions were addressed. Maximal Sterile Barrier Technique was utilized including caps, mask, sterile gowns, sterile gloves, sterile drape, hand hygiene and skin antiseptic. A timeout was performed prior to the initiation of the procedure. The right neck was prepped with ChloraPrep in a sterile fashion, and a sterile drape was applied covering the operative field. A sterile gown and sterile gloves were used for the procedure. 1% lidocaine into the skin and  subcutaneous tissue. The jugular vein was noted to be patent initially with ultrasound. Under sonographic guidance, a micropuncture needle was inserted into the right IJ vein (Ultrasound and fluoroscopic image documentation was performed). It was removed over an 018 wire which was up-sized to an Amplatz. This was advanced into the IVC. A small incision was made in the right upper chest. The tunneling device was utilized to advance the 23 centimeter tip to cuff catheter from the chest incision and out the neck incision. A peel-away sheath was advanced over the Amplatz wire. The leading edge of the catheter was then advanced through the peel-away sheath. The peel-away sheath was removed. It was flushed and instilled with heparin. The chest incision was closed with a 0 Prolene pursestring stitch. The neck incision was closed with a 4-0 Vicryl  subcuticular stitch. IMPRESSION: Successful right IJ vein tunneled dialysis catheter with its tip in the right atrium. Electronically Signed   By: Jolaine Click M.D.   On: 07/20/2016 15:17   Ir Removal Tun Cv Cath W/o Fl  Result Date: 08/01/2016 INDICATION: 21 year old with history of acute kidney injury. Tunneled dialysis catheter is no longer needed. EXAM: REMOVAL TUNNELED CENTRAL VENOUS CATHETER MEDICATIONS: None ANESTHESIA/SEDATION: None FLUOROSCOPY TIME:  None COMPLICATIONS: None immediate. PROCEDURE: Informed written consent was obtained from the patient after a thorough discussion of the procedural risks, benefits and alternatives. All questions were addressed. Retention suture was removed. Heparin was removed from the lumens. The catheter was easily removed with traction. Dressing placed over the skin exit site. IMPRESSION: Successful catheter removal as described above. Electronically Signed   By: Richarda Overlie M.D.   On: 08/01/2016 11:16   Ir US Guide Vasc Access Right  Result Date: 07/20/2016 INDICATION: Renal failure EXAM: TUNNELED DIALYSIS CATHETER PLACEMENT,  ULTRASOUND GUIDANCE FOR VASCULAR ACCESS MEDICATIONS: Ancef; The antibiotic was administered within an appropriate time interval prior to skin puncture. ANESTHESIA/SEDATION: Versed 4 mg IV; Fentanyl 100 mcg IV; Moderate Sedation Time:  20 The patient was continuously monitored during the procedure by the interventional radiology nurse under my direct supervision. FLUOROSCOPY TIME:  Fluoroscopy Time:  minutes 42 seconds (3 mGy). COMPLICATIONS: None immediate. PROCEDURE: Informed written consent was obtained from the patient after a thorough discussion of the procedural risks, benefits and alternatives. All questions were addressed. Maximal Sterile Barrier Technique was utilized including caps, mask, sterile gowns, sterile gloves, sterile drape, hand hygiene and skin antiseptic. A timeout was performed prior to the initiation of the procedure. The right neck was prepped with ChloraPrep in a sterile fashion, and a sterile drape was applied covering the operative field. A sterile gown and sterile gloves were used for the procedure. 1% lidocaine into the skin and subcutaneous tissue. The jugular vein was noted to be patent initially with ultrasound. Under sonographic guidance, a micropuncture needle was inserted into the right IJ vein (Ultrasound and fluoroscopic image documentation was performed). It was removed over an 018 wire which was up-sized to an Amplatz. This was advanced into the IVC. A small incision was made in the right upper chest. The tunneling device was utilized to advance the 23 centimeter tip to cuff catheter from the chest incision and out the neck incision. A peel-away sheath was advanced over the Amplatz wire. The leading edge of the catheter was then advanced through the peel-away sheath. The peel-away sheath was removed. It was flushed and instilled with heparin. The chest incision was closed with a 0 Prolene pursestring stitch. The neck incision was closed with a 4-0 Vicryl subcuticular stitch.  IMPRESSION: Successful right IJ vein tunneled dialysis catheter with its tip in the right atrium. Electronically Signed   By: Jolaine Click M.D.   On: 07/20/2016 15:17   Dg Chest Port 1 View  Result Date: 07/14/2016 CLINICAL DATA:  Central line placement EXAM: PORTABLE CHEST 1 VIEW COMPARISON:  07/12/2016 FINDINGS: Endotracheal tube has been removed. Left-sided central venous catheter tip overlies the distal SVC. Right sided central venous catheter tip also overlies SVC. Esophageal tube has been removed. Mildly low lung volumes. No focal consolidation or effusion. Normal heart size. No pneumothorax. IMPRESSION: Removal of endotracheal and esophageal tubes. Tips of the central venous catheters overlie the SVC. Low lung volume but no pneumothorax or infiltrate. Electronically Signed   By: Jasmine Pang M.D.   On: 07/14/2016  00:21   Dg Chest Port 1 View  Result Date: 07/12/2016 CLINICAL DATA:  Acute respiratory failure EXAM: PORTABLE CHEST 1 VIEW COMPARISON:  07/11/2016 and prior radiographs FINDINGS: An endotracheal tube with tip 4 cm above the carina, NG tube entering stomach with tip off the field of view, right central venous catheter with tip overlying the mid SVC and left IJ central venous catheter with tip overlying the lower SVC again noted. This is a low volume film with mild bibasilar atelectasis again noted. There is no evidence of pneumothorax or definite pleural effusion. IMPRESSION: Unchanged appearance of the chest. Electronically Signed   By: Harmon Pier M.D.   On: 07/12/2016 07:11   Dg Chest Port 1 View  Result Date: 07/11/2016 CLINICAL DATA:  Hypoxia EXAM: PORTABLE CHEST 1 VIEW COMPARISON:  July 10, 2016 FINDINGS: Endotracheal tube tip is 4.1 cm above the carina. The left jugular catheter tip is at the cavoatrial junction. Right jugular catheter tip is in the superior cava near the cavoatrial junction. Nasogastric tube tip and side port are below the diaphragm. No pneumothorax. There is mild  atelectasis in the right base. Lungs elsewhere clear. Heart size and pulmonary vascularity are normal. No adenopathy. No bone lesions. IMPRESSION: Tube and catheter positions as described without evident pneumothorax. Right base atelectasis. Lungs elsewhere clear. Stable cardiac silhouette. Electronically Signed   By: Bretta Bang III M.D.   On: 07/11/2016 07:04   Dg Chest Port 1 View  Result Date: 07/10/2016 CLINICAL DATA:  Respiratory failure. EXAM: PORTABLE CHEST 1 VIEW COMPARISON:  07/09/2016 . FINDINGS: Endotracheal tube, NG tube, bilateral IJ lines in stable position. Heart size normal. Low lung volumes with mild basilar atelectasis. No change from prior exam. No pleural effusion or pneumothorax. IMPRESSION: 1. Lines and tubes stable position. 2. Low lung volumes with mild basilar atelectasis. No change from prior exam. Electronically Signed   By: Maisie Fus  Register   On: 07/10/2016 06:43   Dg Chest Port 1 View  Result Date: 07/09/2016 CLINICAL DATA:  Status post left catheter placement. EXAM: PORTABLE CHEST 1 VIEW COMPARISON:  Radiograph of July 09, 2016. FINDINGS: Stable cardiomediastinal silhouette. Endotracheal and orogastric tubes are unchanged in position. Right internal jugular catheter is unchanged. Mild bibasilar subsegmental atelectasis is now noted. Interval placement of left internal jugular dialysis catheter with distal tip in expected position of cavoatrial junction. No pneumothorax or pleural effusion is noted. IMPRESSION: Mild bibasilar subsegmental atelectasis. Interval placement of left internal jugular dialysis catheter with distal tip in expected position of cavoatrial junction. No pneumothorax is noted. Electronically Signed   By: Lupita Raider, M.D.   On: 07/09/2016 12:28   Dg Chest Port 1 View  Result Date: 07/09/2016 CLINICAL DATA:  Endotracheal tube and central line placement. EXAM: PORTABLE CHEST 1 VIEW COMPARISON:  None. FINDINGS: The heart size and mediastinal contours  are within normal limits. No pneumothorax or pleural effusion is noted. Minimal right basilar subsegmental atelectasis is noted. Right internal jugular catheter is noted with distal tip in expected position of the SVC. Endotracheal tube is projected over tracheal air shadow with distal tip 6 cm above the carina. Orogastric tube tip is seen in proximal stomach. The visualized skeletal structures are unremarkable. IMPRESSION: Endotracheal tube, orogastric tube and right internal jugular catheter in grossly good position. No pneumothorax is noted. Minimal right basilar subsegmental atelectasis. Electronically Signed   By: Lupita Raider, M.D.   On: 07/09/2016 08:44    Time Spent in minutes  30   Regalado, Belkys A M.D on 08/03/2016 at 12:47 PM  Between 7am to 7pm - Pager - [phone]  After 7pm go to [email or web address] - password [email or web address]  [facility] -  Office  [phone]

## 2016-08-03 NOTE — Progress Notes (Signed)
Foley catheter gets clogged on and off. Patient stated he urinated around the catheter this am. Bloody urine noted on his sheets and on the floor. Patient continues to have the urge to urinate and have burning sensations. Refused catheter  to get flushed. Will continue to monitor. No bladder distention noted.

## 2016-08-04 LAB — RENAL FUNCTION PANEL
Albumin: 3 g/dL — ABNORMAL LOW (ref 3.5–5.0)
Anion gap: 7 (ref 5–15)
BUN: 23 mg/dL — ABNORMAL HIGH (ref 6–20)
CALCIUM: 9 mg/dL (ref 8.9–10.3)
CHLORIDE: 110 mmol/L (ref 101–111)
CO2: 22 mmol/L (ref 22–32)
Creatinine, Ser: 2 mg/dL — ABNORMAL HIGH (ref 0.61–1.24)
GFR calc non Af Amer: 46 mL/min — ABNORMAL LOW (ref >60)
GFR, EST AFRICAN AMERICAN: 54 mL/min — AB (ref >60)
GLUCOSE: 97 mg/dL (ref 65–99)
Phosphorus: 2.9 mg/dL (ref 2.5–4.6)
Potassium: 3.3 mmol/L — ABNORMAL LOW (ref 3.5–5.1)
SODIUM: 139 mmol/L (ref 135–145)

## 2016-08-04 LAB — CBC
HCT: 21.9 % — ABNORMAL LOW (ref 39.0–52.0)
HEMOGLOBIN: 6.8 g/dL — AB (ref 13.0–17.0)
MCH: 29.4 pg (ref 26.0–34.0)
MCHC: 31.1 g/dL (ref 30.0–36.0)
MCV: 94.8 fL (ref 78.0–100.0)
PLATELETS: 283 10*3/uL (ref 150–400)
RBC: 2.31 MIL/uL — AB (ref 4.22–5.81)
RDW: 15.7 % — ABNORMAL HIGH (ref 11.5–15.5)
WBC: 7.2 10*3/uL (ref 4.0–10.5)

## 2016-08-04 LAB — PREPARE RBC (CROSSMATCH)

## 2016-08-04 MED ORDER — METHADONE HCL 10 MG PO TABS
10.0000 mg | ORAL_TABLET | Freq: Every day | ORAL | Status: DC
  Administered 2016-08-04 – 2016-08-06 (×3): 10 mg via ORAL
  Filled 2016-08-04 (×3): qty 1

## 2016-08-04 MED ORDER — SODIUM CHLORIDE 0.9 % IV SOLN
Freq: Once | INTRAVENOUS | Status: AC
  Administered 2016-08-04: 10:00:00 via INTRAVENOUS

## 2016-08-04 NOTE — Care Management Note (Addendum)
Case Management Note  Patient Details  Name: Blake Kramer MRN: [id number] Date of Birth: 05-Jan-1996  Subjective/Objective:                 Patient no longer needing intermitten HD. Plan will be for DC o home when medically stable. Spoke to patient in the room. He anticipates going home with foley in place (2/2 to urethral trauma). PT recommending [facility], [facility] to deliver to room prior to DC, [facility]. [facility] due [facility] indwelling f[facility]. Anticipate HH PT. [place] Grandfather is transitioning homes. [facility]. Spoke w [facility]. They will be able to take patient. CM faxed facesheet and last progress note and PT note 7/3.   [facility] now not accepting referral stating "RN does not feel comfortable going to house since he is a known drug user."    Action/Plan:  DC to home when medically stable.   Expected Discharge Date:                  Expected Discharge Plan:  Home w Home Health Services  In-House Referral:  Clinical Social Work  Discharge planning Services  CM Consult  Post Acute Care Choice:  Durable Medical Equipment, Home Health Choice offered to:  Patient  DME Arranged:  [place] [facility] DME Agency:  [facility]  HH Arranged:    HH Agency:     Status of Service:  In process, will continue to follow  If discussed at Long Length of Stay Meetings, dates discussed:    Additional Comments:  Lawerance SabalDebbie Joselito Fieldhouse, RN 08/04/2016, 3:05 PM

## 2016-08-04 NOTE — Progress Notes (Signed)
UROLOGY INPATIENT PROGRESS NOTES  Subjective/Chef Complaint:  1 - Urethral Injury - catheter trauma resulting in distal urethral / meatual injury presntly being managed with healing over large bore catheter (61F) last placed 07/13/16 over wire.  2 - Gross Hematuria - ongoing since 6/19 requiring PRN irrigation  3 - Acute Renal Failure - due to rhabdo / drug overdose, now making urine off dialysis. Cr 2.0 today.   Today Blake Kramer is seen for evaluation due to ongoing gross hematuria requiring blood transfusion today (hgb 6.8, steady downtrend since admission). 15.5 on admission, 11.8 after urethral trauma. Since making more urine, Blake Kramer notes worsening frequency and suprapubic pain with leakage around catheter.   Objective:  Vital signs in last 24 hours: Temp:  [97.8 F (36.6 C)-98.3 F (36.8 C)] 98.3 F (36.8 C) (07/03 1035) Pulse Rate:  [74-117] 102 (07/03 1035) Resp:  [18-20] 20 (07/03 1035) BP: (101-132)/(52-74) 120/63 (07/03 1035) SpO2:  [99 %-100 %] 100 % (07/03 1035) Weight:  [97 kg (213 lb 14.4 oz)] 97 kg (213 lb 14.4 oz) (07/02 2227)  07/02 0701 - 07/03 0700 In: 4182.5 [P.O.:540; I.V.:3642.5] Out: 4050 [Urine:3300]   Physical Exam:  General:  well-developed and well-nourished  in NAD, lying in bed, alert & oriented, pleasant HEENT: /AT, EOMI, sclera anicteric, hearing grossly intact, no nasal discharge, MMM Respiratory: nonlabored respirations, satting well on RA, symmetrical chest rise Cardiovascular: appropriate perfusion  Abdominal: soft, NTTP, nondistended GU: foley catheter in place, dried blood around meatus. Catheter advanced easily. Catheter irrigate, thin cherry red blood with small clots, irrigated easily.  Extremities: warm, well-perfused, no c/c/e Neuro: no focal deficits  Data Review: Results for orders placed or performed during the hospital encounter of 07/09/16 (from the past 24 hour(s))  Renal function panel     Status: Abnormal   Collection Time:  08/04/16  6:02 AM  Result Value Ref Range   Sodium 139 135 - 145 mmol/L   Potassium 3.3 (L) 3.5 - 5.1 mmol/L   Chloride 110 101 - 111 mmol/L   CO2 22 22 - 32 mmol/L   Glucose, Bld 97 65 - 99 mg/dL   BUN 23 (H) 6 - 20 mg/dL   Creatinine, Ser 1.612.00 (H) 0.61 - 1.24 mg/dL   Calcium 9.0 8.9 - 09.610.3 mg/dL   Phosphorus 2.9 2.5 - 4.6 mg/dL   Albumin 3.0 (L) 3.5 - 5.0 g/dL   GFR calc non Af Amer 46 (L) >60 mL/min   GFR calc Af Amer 54 (L) >60 mL/min   Anion gap 7 5 - 15  CBC     Status: Abnormal   Collection Time: 08/04/16  6:02 AM  Result Value Ref Range   WBC 7.2 4.0 - 10.5 K/uL   RBC 2.31 (L) 4.22 - 5.81 MIL/uL   Hemoglobin 6.8 (LL) 13.0 - 17.0 g/dL   HCT 04.521.9 (L) 40.939.0 - 81.152.0 %   MCV 94.8 78.0 - 100.0 fL   MCH 29.4 26.0 - 34.0 pg   MCHC 31.1 30.0 - 36.0 g/dL   RDW 91.415.7 (H) 78.211.5 - 95.615.5 %   Platelets 283 150 - 400 K/uL  Type and screen [facility]     Status: None (Preliminary result)   Collection Time: 08/04/16  8:50 AM  Result Value Ref Range   ABO/RH(D) O POS    Antibody Screen NEG    Sample Expiration 08/07/2016    Unit Number [id number]    Blood Component Type RED CELLS,LR    Unit division  00    Status of Unit ISSUED    Transfusion Status OK TO TRANSFUSE    Crossmatch Result Compatible    Unit Number [id number]    Blood Component Type RBC LR PHER2    Unit division 00    Status of Unit ALLOCATED    Transfusion Status OK TO TRANSFUSE    Crossmatch Result Compatible   Prepare RBC     Status: None   Collection Time: 08/04/16  8:50 AM  Result Value Ref Range   Order Confirmation ORDER PROCESSED BY BLOOD BANK     Imaging:  RBUS - 6/21 -  1. No evidence of hydronephrosis. 2. Mildly increased renal parenchymal echogenicity may reflect medical renal disease. 3. Bladder decompressed, with a Foley catheter in place. No significant blood clot seen within the bladder, though evaluation of the bladder is limited.  Assessment/Plan:   1 - Urethral  Injury- catheter has been in place since 6/11. Depending on patient's dispo plan, will reevaluate urine on 7/5-6 to determine if catheter can be removed while patient hospitalization   2 - Gross Hematuria- likely from known urethral injury, though degree of hematuria is more than expected. Particularly requiring blood transfusion for gross hematuria from urethral injury is not comon. Catheter in place and draining well. Urine thin and he's had really good UOP. Irrigate PRN by hand. No role for CBI given urethral/prostatic source of bleeding.   3 - Acute Renal Failure- per nephrology, appears to be resolving  4 - Bladder spasms - PRN B&O suppositories written for, recommended patient ask for these when he feels urge to urinate. May help with gross hematuria as well.   Will plan to reevaluate patient on 7/5 to determine if catheter can be removed and TOV performed.   Callie Fielding, MD Urologic Surgery Resident  -------

## 2016-08-04 NOTE — Progress Notes (Signed)
OT Cancellation Note  Patient Details Name: Blake Kramer Paolillo MRN: [id number] DOB: Jan 14, 1996   Cancelled Treatment:    Reason Eval/Treat Not Completed: Medical issues which prohibited therapy.  Noted Hgb is 6.8 and plan is for 2 units PRBCs. Will checkback another day.  [name] 08/04/2016, 1:30 PM  [name], OTR/L [phone] 08/04/2016

## 2016-08-04 NOTE — Progress Notes (Signed)
Patient ID: Blake BoutonLindon Cope, male   DOB: [birth year], 21 y.o.   MRN: [id number] S:still having issues with foley O:BP 117/66 (BP Location: Left Arm)   Pulse 77   Temp 97.8 F (36.6 C) (Oral)   Resp 18   Ht 5\' 11"  (1.803 m)   Wt 97 kg (213 lb 14.4 oz)   SpO2 100%   BMI 29.83 kg/m   Intake/Output Summary (Last 24 hours) at 08/04/16 0928 Last data filed at 08/04/16 0905  Gross per 24 hour  Intake           4182.5 ml  Output             4650 ml  Net           -467.5 ml   Intake/Output: I/O last 3 completed shifts: In: 6022.5 [P.O.:720; I.V.:5212.5; Other:90] Out: 4730 [Urine:3980; Other:750]  Intake/Output this shift:  Total I/O In: -  Out: 600 [Urine:600] Weight change:  Gen: WD WN WM in NAD CVS:no rub Resp:cta Abd:+BS, soft, NT Ext: no edema   Recent Labs Lab 07/28/16 1243 07/30/16 0431 07/31/16 0759 08/01/16 0401 08/02/16 0656 08/02/16 1549 08/04/16 0602  NA 137 138 138 137 137 136 139  K 5.0 3.6 3.8 3.5 3.5 3.5 3.3*  CL 102 107 109 109 106 107 110  CO2 19* 21* 21* 20* 22 23 22   GLUCOSE 90 116* 92 96 91 91 97  BUN 103* 86* 62* 50* 34* 32* 23*  CREATININE 10.46* 7.90* 5.33* 4.08* 2.94* 2.78* 2.00*  ALBUMIN  --  3.1*  --   --  3.2*  --  3.0*  CALCIUM 9.1 9.1 9.4 9.4 9.4 9.0 9.0  PHOS  --   --   --   --   --   --  2.9  AST  --  28  --   --  24  --   --   ALT  --  44  --   --  30  --   --    Liver Function Tests:  Recent Labs Lab 07/30/16 0431 08/02/16 0656 08/04/16 0602  AST 28 24  --   ALT 44 30  --   ALKPHOS 83 80  --   BILITOT 0.3 0.5  --   PROT 6.2* 6.4*  --   ALBUMIN 3.1* 3.2* 3.0*   No results for input(s): LIPASE, AMYLASE in the last 168 hours. No results for input(s): AMMONIA in the last 168 hours. CBC:  Recent Labs Lab 07/30/16 0431 08/02/16 0656 08/04/16 0602  WBC 6.8 5.0 7.2  HGB 7.6* 8.0* 6.8*  HCT 24.2* 25.4* 21.9*  MCV 93.4 93.7 94.8  PLT 392 295 283   Cardiac Enzymes: No results for input(s): CKTOTAL, CKMB, CKMBINDEX,  TROPONINI in the last 168 hours. CBG: No results for input(s): GLUCAP in the last 168 hours.  Iron Studies: No results for input(s): IRON, TIBC, TRANSFERRIN, FERRITIN in the last 72 hours. Studies/Results: No results found. . darbepoetin (ARANESP) injection - NON-DIALYSIS  150 mcg Subcutaneous Q Thu-1800  . docusate sodium  100 mg Oral BID  . feeding supplement (NEPRO CARB STEADY)  237 mL Oral BID BM  . ferrous sulfate  325 mg Oral BID WC  . methadone  10 mg Oral Daily  . neomycin-bacitracin-polymyxin   Topical BID  . predniSONE  40 mg Oral Q breakfast  . sodium chloride flush  10-40 mL Intracatheter Q12H    BMET    Component Value Date/Time  NA 139 08/04/2016 0602   K 3.3 (L) 08/04/2016 0602   CL 110 08/04/2016 0602   CO2 22 08/04/2016 0602   GLUCOSE 97 08/04/2016 0602   BUN 23 (H) 08/04/2016 0602   CREATININE 2.00 (H) 08/04/2016 0602   CALCIUM 9.0 08/04/2016 0602   GFRNONAA 46 (L) 08/04/2016 0602   GFRAA 54 (L) 08/04/2016 0602   CBC    Component Value Date/Time   WBC 7.2 08/04/2016 0602   RBC 2.31 (L) 08/04/2016 0602   HGB 6.8 (LL) 08/04/2016 0602   HCT 21.9 (L) 08/04/2016 0602   PLT 283 08/04/2016 0602   MCV 94.8 08/04/2016 0602   MCH 29.4 08/04/2016 0602   MCHC 31.1 08/04/2016 0602   RDW 15.7 (H) 08/04/2016 0602   LYMPHSABS 2.1 07/28/2016 0448   MONOABS 0.5 07/28/2016 0448   EOSABS 0.3 07/28/2016 0448   BASOSABS 0.1 07/28/2016 0448     Assessment/Plan:  1. AKI due to rhabdomyolysis- improving with IVF's and had high output recovery phase but seems to be slowing down. Scr continues to improve.  Nothing further to add.  Will sign off.  Please call with questions or concerns.  He should not need f/u with our practice as he should have renal recovery but if remains elevated, his pcp can refer back to our practice.  2. Rhabdomyolysis- due to heroin overdose.  Improving with ivf's 3. Heroin addiction with overdose, apparently unintentional.  Psych has  seen 4. Hematuria- due to urethral trauma- s/p flushing and B&O supp to help with bladder spasms.  May need continuous bladder irrigation. 5. Anemia- follow. 6. HD catheter removed and appreciate assistance of Dr. Lowella Dandy.  Irena Cords, MD [employer] [phone]

## 2016-08-04 NOTE — Progress Notes (Signed)
Nutrition Follow-up  DOCUMENTATION CODES:   Obesity unspecified  INTERVENTION:  1. Continue Nepro Shake po BID, each supplement provides 425 kcal and 19 grams protein  NUTRITION DIAGNOSIS:   Increased nutrient needs related to acute illness as evidenced by estimated needs. -ongoing  GOAL:   Patient will meet greater than or equal to 90% of their needs -progressing  MONITOR:   PO intake, Supplement acceptance, Labs, I & O's  ASSESSMENT:   Pt with no know PMH admitted after presumed heroine and fentanyl OD possibly down for 10 hours. Intubated with rhabdo, hyperkalemia, AKI.   Continues to have foley cath with hematuria r/t previous urethral trauma PO intake ok, patient reports appetite as ok, claims he would eat more if he were able to have a liberalized diet. Frustrated with food choices. Ate a couple muffins this morning. Consuming Nepro shakes. No longer requiring HD, catheter removed. UOP good. AKI resolving, monitor for possible diet liberalization. Labs and medications reviewed: K 3.3 BUN/Creatinine: 23/2.00 Colace, Iron, Prednisone, Methadone 1/2 NS @ 1425mL/hr  Diet Order:  Diet renal with fluid restriction Fluid restriction: 1200 mL Fluid; Room service appropriate? Yes; Fluid consistency: Thin  Skin:  Reviewed, no issues  Last BM:  07/27/16  Height:   Ht Readings from Last 1 Encounters:  08/01/16 5\' 11"  (1.803 m)    Weight:   Wt Readings from Last 1 Encounters:  08/03/16 213 lb 14.4 oz (97 kg)    Ideal Body Weight:  80.9 kg  BMI:  Body mass index is 29.83 kg/m.  Estimated Nutritional Needs:   Kcal:  2200-2400  Protein:  100-115 gm  Fluid:  per MD  EDUCATION NEEDS:   No education needs identified at this time  Dionne AnoWilliam M. Zailen Albarran, MS, RD LDN Inpatient Clinical Dietitian Pager [phone]

## 2016-08-04 NOTE — Progress Notes (Signed)
Results for [name], [name] (MRN [id number]) as of 08/04/2016 07:41  Ref. Range 08/04/2016 06:02  Hemoglobin Latest Ref Range: 13.0 - 17.0 g/dL 6.8 (LL)   Dr. Sunnie Nielsenegalado paged via Amion.

## 2016-08-04 NOTE — Progress Notes (Signed)
Patient ID: Blake Kramer, male   DOB: [birth year], 21 y.o.   MRN: [id number]                                                                PROGRESS NOTE                                                                                                                                                                                                             Patient Demographics:    Blake Kramer, is a 21 y.o. male, DOB - 01-19-1996, WUJ:[id number]  Admit date - 07/09/2016   Admitting Physician Kalman Shan, MD  Outpatient Primary MD for the patient is Patient, No Pcp Per  LOS - 26  Outpatient Specialists:  No chief complaint on file.      Brief Narrative  Allegedly drug user and dealer [see psych Md note 6/20] Admit by PCCM from [facility] with OD, Fentanyl/morphine Intubated for airway protection.  Noted Mild hypotension on admission, positive troponin, pinpoint pupils, hyperkalemia and oliguria Narcan unrespnsive Lactate 10.4, SCr 4.4, K 7.1, AST 1453, ALT 1008, AG 23, WBC 18.9, Hgb 17.8, Trop 4.48. UDS positive for opiates Initially admitted and needed CRRT transitioning to intermittent dialysis Hospital course complicated by self discontinuation of Foley catheter causing urethral trauma necessitating urgent urological intervention and a coud catheter placement. His renal function has been improving with IV fluids.   Temporary HD catheter removal 6/30    Subjective:    Blake Kramer he is having a lot pain foley catheter, penile area.  He is having blood coming around foley and gross hematuria.    Assessment  & Plan :    Principal Problem:   Drug overdose Active Problems:   AKI (acute kidney injury) (HCC)   Acute respiratory failure (HCC)   Urethral bleeding   Acute renal failure (ARF) (HCC)   Blood clot in bladder   Illicit Drug overdose - intent unclear  Pt now more alert and lucid  Psychiatry has seen and cleared him from suicidality perspective  SW consulted,  need rehab for substance abuse.   Acute resp failure wi and pharmacy to th hypoxia Resolved -due to OD   Urethral trauma  Repeat urethral trauma 6/19 probably secondary to heparinization in dialysis Patient self DC'd Foley initially 6/11 and  needed urgent urological intervention and coud catheter placement At dilaysis on 6/19 experienced pain and found to be in pool of blood-this seems to have ceased Appreciate Dr. Berneice HeinrichManny input, felt 2/2 heparin c Dialysis?urine with clots 6/21, but seems to have resolved 6/25 US renal 6/10 rules out hydronephrosis as per Dr. Mena GoesEskridge, appreciate input  Initially plan was  for at least 4 wksper urology , urology to reevaluate 7-05 to determine if foley will be removed.              Appreciate Dr. Elon SpannerHerricks inp.             Off  heparin              Dc heparin for dvt prophylaxis. scd ordered.              Patient with significant pain and hematuria today. Urology contacted, they will see patient today.   Anemia From hematuria ? If Hgb <7 may need transfusion Repeat cbc in am started ferrous sulfate.  Patient to received 2 units PRBC.   Acute oliguric kidney injury secondary to rhabdomyolysis on CRRT in the ICU not yet declared ESRD Orthostatic hypotension noted 6/25, ? 2/2 to ABLA vs vol depletion from dialysis uncertain whether he will need ongoing dialysis--Last dialysis 6/23, nephrology seems to think he may not need long-term dialysis which would be good given his drug history. He is now passing ~ 1,200 liter sof urine daily-atn in post-oliguric phase Appreciate nephrology input Await CM and Sw input Urine out put 1.4  L today. Plan to decreased IV fluids today to 125 cc. Patient with high output phase recovery renal failure.  IR  removed temporary dialysis catheter 6/30    Shock Liver / Transaminitis Due to above - resolved   Acute blood loss due to urethral trauma See above Hemoglobin currently  8.2--->7.9--8.0--6.8 iron studies 6/21 showed iron of 36 and saturation of 11 receiving EPO/IV iron per nephro Start iron feso4 325 bid Patient to get 2 Units of PRBC.    Acute encephalopathy  due to intentionaldrug overdose + sedation(Heroin and Fentanyl) - methadone 10 mg daily Have discontinued psychotropics Patient cleared from this perspective of from psychiatry and now await medical disposition in terms of renal insufficiency  R>L hearing loss Etiology unclear-Hypotension, ?Precedex? Propofol D/w Dr. 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 Patient now report some improvement while on prednisone.  Prednisone resume 7-02. , need taper.  He will need OP follow up with ENT on d/c  Hypokalemia; replete orally.   Inpatient;  Follow renal function.  PT evaluation.  SW to help with drug rehab.      Lab Results  Component Value Date   PLT 283 08/04/2016      Anti-infectives    Start     Dose/Rate Route Frequency Ordered Stop   07/20/16 1432  vancomycin (VANCOCIN) 1-5 GM/200ML-% IVPB    Comments:  Georgette ShellSpencer, Sara   : cabinet override      07/20/16 1432 07/20/16 1553   07/20/16 1300  vancomycin (VANCOCIN) IVPB 1000 mg/200 mL premix     1,000 mg 200 mL/hr over 60 Minutes Intravenous To [facility] 07/20/16 1256 07/20/16 1540   07/10/16 1200  cefTRIAXone (ROCEPHIN) 1 g in dextrose 5 % 50 mL IVPB     1 g 100 mL/hr over 30 Minutes Intravenous Every 24 hours  07/10/16 1136 07/16/16 1240        Objective:   Vitals:   08/04/16 0908 08/04/16 1010 08/04/16 1035 08/04/16 1245  BP: 117/66 (!) 101/52 120/63 123/74  Pulse: 77 (!) 117 (!) 102 94  Resp:  20 20 18   Temp: 97.8 F (36.6 C) 98 F (36.7 C) 98.3 F (36.8 C) 98.3 F (36.8 C)  TempSrc: Oral Oral Oral Oral  SpO2: 100% 99% 100%   Weight:       Height:        Wt Readings from Last 3 Encounters:  08/03/16 97 kg (213 lb 14.4 oz)     Intake/Output Summary (Last 24 hours) at 08/04/16 1305 Last data filed at 08/04/16 1245  Gross per 24 hour  Intake           4512.5 ml  Output             3950 ml  Net            562.5 ml     Physical Exam  General; in severe pain  CVS; S 1, S 2 RRR Neck; supple, no lymphadenopathy  Lungs. CTA Abdomen; soft, supra-pubic tenderness.  Foley in place , gross hematuria.  Temporarly HD catheter has been removed    Data Review:    CBC  Recent Labs Lab 07/30/16 0431 08/02/16 0656 08/04/16 0602  WBC 6.8 5.0 7.2  HGB 7.6* 8.0* 6.8*  HCT 24.2* 25.4* 21.9*  PLT 392 295 283  MCV 93.4 93.7 94.8  MCH 29.3 29.5 29.4  MCHC 31.4 31.5 31.1  RDW 15.2 15.0 15.7*    Chemistries   Recent Labs Lab 07/30/16 0431 07/31/16 0759 08/01/16 0401 08/02/16 0656 08/02/16 1549 08/04/16 0602  NA 138 138 137 137 136 139  K 3.6 3.8 3.5 3.5 3.5 3.3*  CL 107 109 109 106 107 110  CO2 21* 21* 20* 22 23 22   GLUCOSE 116* 92 96 91 91 97  BUN 86* 62* 50* 34* 32* 23*  CREATININE 7.90* 5.33* 4.08* 2.94* 2.78* 2.00*  CALCIUM 9.1 9.4 9.4 9.4 9.0 9.0  AST 28  --   --  24  --   --   ALT 44  --   --  30  --   --   ALKPHOS 83  --   --  80  --   --   BILITOT 0.3  --   --  0.5  --   --    ------------------------------------------------------------------------------------------------------------------ No results for input(s): CHOL, HDL, LDLCALC, TRIG, CHOLHDL, LDLDIRECT in the last 72 hours.  No results found for: HGBA1C ------------------------------------------------------------------------------------------------------------------ No results for input(s): TSH, T4TOTAL, T3FREE, THYROIDAB in the last 72 hours.  Invalid input(s): FREET3 ------------------------------------------------------------------------------------------------------------------ No results for input(s): VITAMINB12, FOLATE, FERRITIN,  TIBC, IRON, RETICCTPCT in the last 72 hours.  Coagulation profile No results for input(s): INR, PROTIME in the last 168 hours.  No results for input(s): DDIMER in the last 72 hours.  Cardiac Enzymes No results for input(s): CKMB, TROPONINI, MYOGLOBIN in the last 168 hours.  Invalid input(s): CK ------------------------------------------------------------------------------------------------------------------ No results found for: BNP  Inpatient Medications  Scheduled Meds: . darbepoetin (ARANESP) injection - NON-DIALYSIS  150 mcg Subcutaneous Q Thu-1800  . docusate sodium  100 mg Oral BID  . feeding supplement (NEPRO CARB STEADY)  237 mL Oral BID BM  . ferrous sulfate  325 mg Oral BID WC  . methadone  10 mg Oral Daily  . neomycin-bacitracin-polymyxin   Topical BID  .  predniSONE  40 mg Oral Q breakfast  . sodium chloride flush  10-40 mL Intracatheter Q12H   Continuous Infusions: . sodium chloride 150 mL/hr at 08/04/16 0617   PRN Meds:.acetaminophen, lidocaine (PF), lidocaine-prilocaine, morphine CONCENTRATE, opium-belladonna, pentafluoroprop-tetrafluoroeth  Micro Results No results found for this or any previous visit (from the past 240 hour(s)).  Radiology Reports US Renal  Result Date: 07/23/2016 CLINICAL DATA:  Subacute onset of renal insufficiency, and hematuria. Initial encounter. EXAM: RENAL / URINARY TRACT ULTRASOUND COMPLETE COMPARISON:  Renal ultrasound performed 07/09/2016 FINDINGS: Right Kidney: Length: 15.3 cm. Echogenicity within normal limits. No mass or hydronephrosis visualized. Left Kidney: Length: 14.9 cm. Mildly increased renal parenchymal echogenicity is noted. No mass or hydronephrosis visualized. Bladder: Decompressed, with a Foley catheter in place. IMPRESSION: 1. No evidence of hydronephrosis. 2. Mildly increased renal parenchymal echogenicity may reflect medical renal disease. 3. Bladder decompressed, with a Foley catheter in place. No significant blood  clot seen within the bladder, though evaluation of the bladder is limited. Electronically Signed   By: Roanna Raider M.D.   On: 07/23/2016 18:42   US Renal  Result Date: 07/09/2016 CLINICAL DATA:  Acute kidney injury EXAM: RENAL / URINARY TRACT ULTRASOUND COMPLETE COMPARISON:  None. FINDINGS: Right Kidney: Length: 12.8 cm. Increased renal cortical echogenicity. No mass or hydronephrosis visualized. Left Kidney: Length: 12.9 cm. Increased renal cortical echogenicity. No mass or hydronephrosis visualized. Bladder: Decompressed bladder with a Foley catheter present. IMPRESSION: 1. No obstructive uropathy. 2. Increased renal cortical echogenicity as can be seen with acute renal failure. Electronically Signed   By: Elige Ko   On: 07/09/2016 12:03   Ir Fluoro Guide Cv Line Right  Result Date: 07/20/2016 INDICATION: Renal failure EXAM: TUNNELED DIALYSIS CATHETER PLACEMENT, ULTRASOUND GUIDANCE FOR VASCULAR ACCESS MEDICATIONS: Ancef; The antibiotic was administered within an appropriate time interval prior to skin puncture. ANESTHESIA/SEDATION: Versed 4 mg IV; Fentanyl 100 mcg IV; Moderate Sedation Time:  20 The patient was continuously monitored during the procedure by the interventional radiology nurse under my direct supervision. FLUOROSCOPY TIME:  Fluoroscopy Time:  minutes 42 seconds (3 mGy). COMPLICATIONS: None immediate. PROCEDURE: Informed written consent was obtained from the patient after a thorough discussion of the procedural risks, benefits and alternatives. All questions were addressed. Maximal Sterile Barrier Technique was utilized including caps, mask, sterile gowns, sterile gloves, sterile drape, hand hygiene and skin antiseptic. A timeout was performed prior to the initiation of the procedure. The right neck was prepped with ChloraPrep in a sterile fashion, and a sterile drape was applied covering the operative field. A sterile gown and sterile gloves were used for the procedure. 1% lidocaine  into the skin and subcutaneous tissue. The jugular vein was noted to be patent initially with ultrasound. Under sonographic guidance, a micropuncture needle was inserted into the right IJ vein (Ultrasound and fluoroscopic image documentation was performed). It was removed over an 018 wire which was up-sized to an Amplatz. This was advanced into the IVC. A small incision was made in the right upper chest. The tunneling device was utilized to advance the 23 centimeter tip to cuff catheter from the chest incision and out the neck incision. A peel-away sheath was advanced over the Amplatz wire. The leading edge of the catheter was then advanced through the peel-away sheath. The peel-away sheath was removed. It was flushed and instilled with heparin. The chest incision was closed with a 0 Prolene pursestring stitch. The neck incision was closed with a 4-0 Vicryl subcuticular stitch. IMPRESSION:  Successful right IJ vein tunneled dialysis catheter with its tip in the right atrium. Electronically Signed   By: Jolaine Click M.D.   On: 07/20/2016 15:17   Ir Removal Tun Cv Cath W/o Fl  Result Date: 08/01/2016 INDICATION: 21 year old with history of acute kidney injury. Tunneled dialysis catheter is no longer needed. EXAM: REMOVAL TUNNELED CENTRAL VENOUS CATHETER MEDICATIONS: None ANESTHESIA/SEDATION: None FLUOROSCOPY TIME:  None COMPLICATIONS: None immediate. PROCEDURE: Informed written consent was obtained from the patient after a thorough discussion of the procedural risks, benefits and alternatives. All questions were addressed. Retention suture was removed. Heparin was removed from the lumens. The catheter was easily removed with traction. Dressing placed over the skin exit site. IMPRESSION: Successful catheter removal as described above. Electronically Signed   By: Richarda Overlie M.D.   On: 08/01/2016 11:16   Ir US Guide Vasc Access Right  Result Date: 07/20/2016 INDICATION: Renal failure EXAM: TUNNELED DIALYSIS  CATHETER PLACEMENT, ULTRASOUND GUIDANCE FOR VASCULAR ACCESS MEDICATIONS: Ancef; The antibiotic was administered within an appropriate time interval prior to skin puncture. ANESTHESIA/SEDATION: Versed 4 mg IV; Fentanyl 100 mcg IV; Moderate Sedation Time:  20 The patient was continuously monitored during the procedure by the interventional radiology nurse under my direct supervision. FLUOROSCOPY TIME:  Fluoroscopy Time:  minutes 42 seconds (3 mGy). COMPLICATIONS: None immediate. PROCEDURE: Informed written consent was obtained from the patient after a thorough discussion of the procedural risks, benefits and alternatives. All questions were addressed. Maximal Sterile Barrier Technique was utilized including caps, mask, sterile gowns, sterile gloves, sterile drape, hand hygiene and skin antiseptic. A timeout was performed prior to the initiation of the procedure. The right neck was prepped with ChloraPrep in a sterile fashion, and a sterile drape was applied covering the operative field. A sterile gown and sterile gloves were used for the procedure. 1% lidocaine into the skin and subcutaneous tissue. The jugular vein was noted to be patent initially with ultrasound. Under sonographic guidance, a micropuncture needle was inserted into the right IJ vein (Ultrasound and fluoroscopic image documentation was performed). It was removed over an 018 wire which was up-sized to an Amplatz. This was advanced into the IVC. A small incision was made in the right upper chest. The tunneling device was utilized to advance the 23 centimeter tip to cuff catheter from the chest incision and out the neck incision. A peel-away sheath was advanced over the Amplatz wire. The leading edge of the catheter was then advanced through the peel-away sheath. The peel-away sheath was removed. It was flushed and instilled with heparin. The chest incision was closed with a 0 Prolene pursestring stitch. The neck incision was closed with a 4-0 Vicryl  subcuticular stitch. IMPRESSION: Successful right IJ vein tunneled dialysis catheter with its tip in the right atrium. Electronically Signed   By: Jolaine Click M.D.   On: 07/20/2016 15:17   Dg Chest Port 1 View  Result Date: 07/14/2016 CLINICAL DATA:  Central line placement EXAM: PORTABLE CHEST 1 VIEW COMPARISON:  07/12/2016 FINDINGS: Endotracheal tube has been removed. Left-sided central venous catheter tip overlies the distal SVC. Right sided central venous catheter tip also overlies SVC. Esophageal tube has been removed. Mildly low lung volumes. No focal consolidation or effusion. Normal heart size. No pneumothorax. IMPRESSION: Removal of endotracheal and esophageal tubes. Tips of the central venous catheters overlie the SVC. Low lung volume but no pneumothorax or infiltrate. Electronically Signed   By: Jasmine Pang M.D.   On: 07/14/2016 00:21  Dg Chest Port 1 View  Result Date: 07/12/2016 CLINICAL DATA:  Acute respiratory failure EXAM: PORTABLE CHEST 1 VIEW COMPARISON:  07/11/2016 and prior radiographs FINDINGS: An endotracheal tube with tip 4 cm above the carina, NG tube entering stomach with tip off the field of view, right central venous catheter with tip overlying the mid SVC and left IJ central venous catheter with tip overlying the lower SVC again noted. This is a low volume film with mild bibasilar atelectasis again noted. There is no evidence of pneumothorax or definite pleural effusion. IMPRESSION: Unchanged appearance of the chest. Electronically Signed   By: Harmon Pier M.D.   On: 07/12/2016 07:11   Dg Chest Port 1 View  Result Date: 07/11/2016 CLINICAL DATA:  Hypoxia EXAM: PORTABLE CHEST 1 VIEW COMPARISON:  July 10, 2016 FINDINGS: Endotracheal tube tip is 4.1 cm above the carina. The left jugular catheter tip is at the cavoatrial junction. Right jugular catheter tip is in the superior cava near the cavoatrial junction. Nasogastric tube tip and side port are below the diaphragm. No  pneumothorax. There is mild atelectasis in the right base. Lungs elsewhere clear. Heart size and pulmonary vascularity are normal. No adenopathy. No bone lesions. IMPRESSION: Tube and catheter positions as described without evident pneumothorax. Right base atelectasis. Lungs elsewhere clear. Stable cardiac silhouette. Electronically Signed   By: Bretta Bang III M.D.   On: 07/11/2016 07:04   Dg Chest Port 1 View  Result Date: 07/10/2016 CLINICAL DATA:  Respiratory failure. EXAM: PORTABLE CHEST 1 VIEW COMPARISON:  07/09/2016 . FINDINGS: Endotracheal tube, NG tube, bilateral IJ lines in stable position. Heart size normal. Low lung volumes with mild basilar atelectasis. No change from prior exam. No pleural effusion or pneumothorax. IMPRESSION: 1. Lines and tubes stable position. 2. Low lung volumes with mild basilar atelectasis. No change from prior exam. Electronically Signed   By: Maisie Fus  Register   On: 07/10/2016 06:43   Dg Chest Port 1 View  Result Date: 07/09/2016 CLINICAL DATA:  Status post left catheter placement. EXAM: PORTABLE CHEST 1 VIEW COMPARISON:  Radiograph of July 09, 2016. FINDINGS: Stable cardiomediastinal silhouette. Endotracheal and orogastric tubes are unchanged in position. Right internal jugular catheter is unchanged. Mild bibasilar subsegmental atelectasis is now noted. Interval placement of left internal jugular dialysis catheter with distal tip in expected position of cavoatrial junction. No pneumothorax or pleural effusion is noted. IMPRESSION: Mild bibasilar subsegmental atelectasis. Interval placement of left internal jugular dialysis catheter with distal tip in expected position of cavoatrial junction. No pneumothorax is noted. Electronically Signed   By: Lupita Raider, M.D.   On: 07/09/2016 12:28   Dg Chest Port 1 View  Result Date: 07/09/2016 CLINICAL DATA:  Endotracheal tube and central line placement. EXAM: PORTABLE CHEST 1 VIEW COMPARISON:  None. FINDINGS: The heart  size and mediastinal contours are within normal limits. No pneumothorax or pleural effusion is noted. Minimal right basilar subsegmental atelectasis is noted. Right internal jugular catheter is noted with distal tip in expected position of the SVC. Endotracheal tube is projected over tracheal air shadow with distal tip 6 cm above the carina. Orogastric tube tip is seen in proximal stomach. The visualized skeletal structures are unremarkable. IMPRESSION: Endotracheal tube, orogastric tube and right internal jugular catheter in grossly good position. No pneumothorax is noted. Minimal right basilar subsegmental atelectasis. Electronically Signed   By: Lupita Raider, M.D.   On: 07/09/2016 08:44    Time Spent in minutes  30  Minsa Weddington A M.D on 08/04/2016 at 1:05 PM  Between 7am to 7pm - Pager - [phone]  After 7pm go to [email or web address] - password [facility]  [facility] -  Office  [phone]

## 2016-08-05 DIAGNOSIS — N3289 Other specified disorders of bladder: Secondary | ICD-10-CM

## 2016-08-05 DIAGNOSIS — T50901D Poisoning by unspecified drugs, medicaments and biological substances, accidental (unintentional), subsequent encounter: Secondary | ICD-10-CM

## 2016-08-05 LAB — BASIC METABOLIC PANEL
ANION GAP: 7 (ref 5–15)
BUN: 20 mg/dL (ref 6–20)
CO2: 23 mmol/L (ref 22–32)
Calcium: 9 mg/dL (ref 8.9–10.3)
Chloride: 111 mmol/L (ref 101–111)
Creatinine, Ser: 1.74 mg/dL — ABNORMAL HIGH (ref 0.61–1.24)
GFR, EST NON AFRICAN AMERICAN: 55 mL/min — AB (ref >60)
Glucose, Bld: 93 mg/dL (ref 65–99)
POTASSIUM: 3.4 mmol/L — AB (ref 3.5–5.1)
SODIUM: 141 mmol/L (ref 135–145)

## 2016-08-05 LAB — CBC
HCT: 23.4 % — ABNORMAL LOW (ref 39.0–52.0)
HEMOGLOBIN: 7.3 g/dL — AB (ref 13.0–17.0)
MCH: 29.1 pg (ref 26.0–34.0)
MCHC: 31.2 g/dL (ref 30.0–36.0)
MCV: 93.2 fL (ref 78.0–100.0)
PLATELETS: 250 10*3/uL (ref 150–400)
RBC: 2.51 MIL/uL — AB (ref 4.22–5.81)
RDW: 16.6 % — ABNORMAL HIGH (ref 11.5–15.5)
WBC: 8.9 10*3/uL (ref 4.0–10.5)

## 2016-08-05 LAB — MAGNESIUM: MAGNESIUM: 1.7 mg/dL (ref 1.7–2.4)

## 2016-08-05 LAB — PREPARE RBC (CROSSMATCH)

## 2016-08-05 MED ORDER — DIPHENHYDRAMINE HCL 25 MG PO CAPS: 25.0000 mg | ORAL_CAPSULE | Freq: Once | ORAL | Status: DC

## 2016-08-05 MED ORDER — PREDNISONE 20 MG PO TABS
30.0000 mg | ORAL_TABLET | Freq: Every day | ORAL | Status: DC
  Administered 2016-08-06: 30 mg via ORAL
  Filled 2016-08-05: qty 1

## 2016-08-05 MED ORDER — MAGNESIUM SULFATE 2 GM/50ML IV SOLN
2.0000 g | Freq: Once | INTRAVENOUS | Status: AC
  Administered 2016-08-05: 2 g via INTRAVENOUS
  Filled 2016-08-05: qty 50

## 2016-08-05 MED ORDER — ACETAMINOPHEN 325 MG PO TABS: 650.0000 mg | ORAL_TABLET | Freq: Once | ORAL | Status: DC

## 2016-08-05 MED ORDER — POTASSIUM CHLORIDE CRYS ER 20 MEQ PO TBCR
60.0000 meq | EXTENDED_RELEASE_TABLET | Freq: Once | ORAL | Status: AC
  Administered 2016-08-05: 60 meq via ORAL
  Filled 2016-08-05: qty 3

## 2016-08-05 MED ORDER — SODIUM CHLORIDE 0.9 % IV SOLN
Freq: Once | INTRAVENOUS | Status: AC
  Administered 2016-08-05: 11:00:00 via INTRAVENOUS

## 2016-08-05 NOTE — Progress Notes (Signed)
Physical Therapy Treatment Patient Details Name: Blake Kramer MRN: [id number] DOB: [birth year] Today's Date: 08/05/2016    History of Present Illness Pt adm with drug overdose. UDS positive for opiates. Pt intubated 6/7 and self extubated 6/11. Pt with AKI and required CRRT which was stopped 6/14. To start HD on 6/16.  Pt pulled out foley with significant trauma. Also pulled out HD cath. No PMH.(per chart 10+ overdoses)    PT Comments    Pt mobilizing very well today with ambulation to elevators and outside. Took a seated rest break in w/c and then ambulated back to 6E. Pt encouraged to be able to walk so far and to be able to go outside. Pt needed vc's several times to attend to obstacles on his right side. Min-guard A during ambulation. PT will continue to follow.    Follow Up Recommendations  Home health PT;Other (comment) [facility] RN)     Equipment Recommendations  Rolling walker with 5" wheels    Recommendations for Other Services       Precautions / Restrictions Precautions Precautions: Fall Precaution Comments: Foley due to significant urethral trauma Restrictions Weight Bearing Restrictions: No    Mobility  Bed Mobility Overal bed mobility: Modified Independent Bed Mobility: Supine to Sit;Sit to Supine     Supine to sit: Supervision;HOB elevated Sit to supine: Supervision;HOB elevated   General bed mobility comments: No assist required  Transfers Overall transfer level: Needs assistance Equipment used: Rolling walker (2 wheeled) Transfers: Sit to/from Stand Sit to Stand: Supervision         General transfer comment: Supervision for safety; no physical assist required.  No c/o dizziness.  Ambulation/Gait Ambulation/Gait assistance: Min guard Ambulation Distance (Feet): 400 Feet Assistive device: Rolling walker (2 wheeled) Gait Pattern/deviations: Step-through pattern;Wide base of support Gait velocity: reduced Gait velocity interpretation: Below normal speed  for age/gender General Gait Details: gait affected by discomfort with catheter. Pt reports that he feels best up this time since he has been admitted. Needed cues several times to attend to objects on R side, corrected with vc's    Stairs            Wheelchair Mobility    Modified Rankin (Stroke Patients Only)       Balance Overall balance assessment: Needs assistance Sitting-balance support: Feet supported;No upper extremity supported Sitting balance-Leahy Scale: Normal     Standing balance support: No upper extremity supported Standing balance-Leahy Scale: Fair Standing balance comment: for static standing, needs UE support for dynamic                            Cognition Arousal/Alertness: Awake/alert Behavior During Therapy: WFL for tasks assessed/performed Overall Cognitive Status: Within Functional Limits for tasks assessed                                 General Comments: pt very focused on getting back to drugs, drinking, smoking, difficult to redirect him.       Exercises      General Comments General comments (skin integrity, edema, etc.): BP supine 116/57, sitting 137/107, standing 114/59, no symptoms of dizziness      Pertinent Vitals/Pain Pain Assessment: Faces Faces Pain Scale: Hurts a little bit Pain Location: groin, penis Pain Descriptors / Indicators: Grimacing Pain Intervention(s): Monitored during session;Repositioned    Home Living  Prior Function            PT Goals (current goals can now be found in the care plan section) Acute Rehab PT Goals Patient Stated Goal: go home tomorrow PT Goal Formulation: With patient Time For Goal Achievement: 08/15/16 Potential to Achieve Goals: Good Progress towards PT goals: Progressing toward goals    Frequency    Min 3X/week      PT Plan Current plan remains appropriate    Co-evaluation PT/OT/SLP Co-Evaluation/Treatment:  Yes Reason for Co-Treatment: For patient/therapist safety PT goals addressed during session: Mobility/safety with mobility;Balance;Proper use of DME OT goals addressed during session: ADL's and self-care      AM-PAC PT "6 Clicks" Daily Activity  Outcome Measure  Difficulty turning over in bed (including adjusting bedclothes, sheets and blankets)?: None Difficulty moving from lying on back to sitting on the side of the bed? : None Difficulty sitting down on and standing up from a chair with arms (e.g., wheelchair, bedside commode, etc,.)?: A Little Help needed moving to and from a bed to chair (including a wheelchair)?: A Little Help needed walking in hospital room?: A Little Help needed climbing 3-5 steps with a railing? : A Little 6 Click Score: 20    End of Session Equipment Utilized During Treatment: Gait belt Activity Tolerance: Patient tolerated treatment well Patient left: in bed;with call bell/phone within reach Nurse Communication: Mobility status PT Visit Diagnosis: Difficulty in walking, not elsewhere classified (R26.2);Pain Pain - part of body:  (penis)     Time: [id number] PT Time Calculation (min) (ACUTE ONLY): 48 min  Charges:  $Gait Training: 23-37 mins                    G Codes:       Lyanne CoVictoria Liahm Grivas, PT  Acute Rehab Services  [phone]    Lawana ChambersVictoria L Kenley Troop 08/05/2016, 3:36 PM

## 2016-08-05 NOTE — Progress Notes (Signed)
Patient ID: Temple Sporer, male   DOB: 1995/05/26, 21 y.o.   MRN: [id number]                                                                PROGRESS NOTE                                                                                                                                                                                                             Patient Demographics:    Aquiles Ruffini, is a 21 y.o. male, DOB - 12/22/1995, JXB:[id number]  Admit date - 07/09/2016   Admitting Physician Kalman Shan, MD  Outpatient Primary MD for the patient is Patient, No Pcp Per  LOS - 27  Outpatient Specialists:  No chief complaint on file.      Brief Narrative  Allegedly drug user and dealer [see psych Md note 6/20] Admit by PCCM from [facility] with OD, Fentanyl/morphine Intubated for airway protection.  Noted Mild hypotension on admission, positive troponin, pinpoint pupils, hyperkalemia and oliguria Narcan unrespnsive Lactate 10.4, SCr 4.4, K 7.1, AST 1453, ALT 1008, AG 23, WBC 18.9, Hgb 17.8, Trop 4.48. UDS positive for opiates Initially admitted and needed CRRT transitioning to intermittent dialysis Hospital course complicated by self discontinuation of Foley catheter causing urethral trauma necessitating urgent urological intervention and a coud catheter placement. His renal function has been improving with IV fluids.   Temporary HD catheter removal 6/30    Subjective:    Nikolas Casher reporting that he is having much less pain/discomfort with foley today but still losing a lot of blood around foley and gross hematuria.  Had 2 units of PRBC yesterday but Hg only up to 7.3.      Assessment  & Plan :    Principal Problem:   Drug overdose Active Problems:   AKI (acute kidney injury) (HCC)   Acute respiratory failure (HCC)   Urethral bleeding   Acute renal failure (ARF) (HCC)   Blood clot in bladder  Illicit Drug overdose - intent unclear  Pt back to baseline mental  status Psychiatry has seen and cleared him from suicidality perspective  SW consulted, need rehab for substance abuse.  Pt looking into sober house living.    Acute resp failure Resolved -due to OD   Urethral trauma  Repeat urethral trauma 6/19 probably secondary to heparinization in dialysis Patient self DC'd Foley initially 6/11 and needed urgent urological intervention and coud catheter placement At dilaysis on 6/19 experienced pain and found to be in pool of blood-this seems to have ceased Appreciate Dr. Berneice Heinrich input, felt 2/2 heparin c Dialysis?urine with clots 6/21, but seems to have resolved 6/25 US renal 6/10 rules out hydronephrosis as per Dr. Mena Goes, appreciate input  Initially plan was  for at least 4 wksper urology , urology to reevaluate 7-05 to determine if foley will be removed.              Appreciate Dr. Elon Spanner inp.             Off  heparin              Dc heparin for dvt prophylaxis. scd ordered.               Anemia From hematuria ? If Hgb <7 may need transfusion Repeat cbc in am started ferrous sulfate.  Patient to received 2 units PRBC.   Acute oliguric kidney injury secondary to rhabdomyolysis on CRRT in the ICU not yet declared ESRD Orthostatic hypotension noted 6/25, ? 2/2 to ABLA vs vol depletion from dialysis uncertain whether he will need ongoing dialysis--Last dialysis 6/23, nephrology seems to think he may not need long-term dialysis which would be good given his drug history. He is now passing ~ 1,200 liter sof urine daily-atn in post-oliguric phase Appreciate nephrology input Await CM and Sw input Urine out put much improved. Plan to decreased IV fluids today. Patient with high output phase recovery renal failure.  IR  removed temporary dialysis catheter 6/30  Shock Liver / Transaminitis Due to above - resolved   Acute blood loss due to urethral trauma See above Hemoglobin currently 8.2--->7.9--8.0--6.8 iron studies  6/21 showed iron of 36 and saturation of 11 receiving EPO/IV iron per nephro  Start iron feso4 325 bid Patient to get an additional 2 Units of PRBC today after 2 units given 7/3.  Hg only improved to 7.3 today after 2 units yesterday.       Acute encephalopathy  due to intentionaldrug overdose + sedation(Heroin and Fentanyl) - methadone 10 mg daily Have discontinued psychotropics Patient cleared from this perspective of from psychiatry and now await medical disposition in terms of renal insufficiency  R>L hearing loss Etiology unclear-Hypotension, ?Precedex? Propofol D/w Dr. 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 Patient now report some improvement while on prednisone.  Prednisone resume 7-02. need taper.  He will need OP follow up with ENT on d/c.  He will need to arrange this because he is going to live in [place] with family.    Hypokalemia; replete orally.   Inpatient;  Follow renal function.  PT evaluation.  SW to help with drug rehab. Pt working with sister for sober living facility.     Lab Results  Component Value Date   PLT 250 08/05/2016      Anti-infectives    Start     Dose/Rate Route Frequency Ordered Stop   07/20/16 1432  vancomycin (VANCOCIN) 1-5 GM/200ML-% IVPB    Comments:  Georgette Shell   : cabinet override      07/20/16 1432 07/20/16 1553   07/20/16 1300  vancomycin (VANCOCIN) IVPB 1000 mg/200 mL premix     1,000 mg 200  mL/hr over 60 Minutes Intravenous To [facility] 07/20/16 1256 07/20/16 1540   07/10/16 1200  cefTRIAXone (ROCEPHIN) 1 g in dextrose 5 % 50 mL IVPB     1 g 100 mL/hr over 30 Minutes Intravenous Every 24 hours 07/10/16 1136 07/16/16 1240        Objective:   Vitals:   08/05/16 0457 08/05/16 0901 08/05/16 1123 08/05/16 1138  BP: (!) 114/51 109/71 (!) 113/56  (!) 114/56  Pulse: 69 68 84 70  Resp: 16 16 18 18   Temp: 98.2 F (36.8 C) (!) 97.3 F (36.3 C) 98.6 F (37 C) 98.1 F (36.7 C)  TempSrc: Oral Oral Oral Oral  SpO2: 100% 100% 98% 99%  Weight:      Height:        Wt Readings from Last 3 Encounters:  08/04/16 97.1 kg (214 lb 1.1 oz)     Intake/Output Summary (Last 24 hours) at 08/05/16 1150 Last data filed at 08/05/16 0902  Gross per 24 hour  Intake          4552.83 ml  Output             3250 ml  Net          1302.83 ml   Physical Exam General; awake, alert, NAD, cooperative.   CVS; S 1, S 2 RRR Neck; supple, no lymphadenopathy  Lungs. CTA Abdomen; soft, supra-pubic tenderness.  Crusted blood from penis, Foley in place , gross hematuria.    Data Review:    CBC  Recent Labs Lab 07/30/16 0431 08/02/16 0656 08/04/16 0602 08/05/16 0436  WBC 6.8 5.0 7.2 8.9  HGB 7.6* 8.0* 6.8* 7.3*  HCT 24.2* 25.4* 21.9* 23.4*  PLT 392 295 283 250  MCV 93.4 93.7 94.8 93.2  MCH 29.3 29.5 29.4 29.1  MCHC 31.4 31.5 31.1 31.2  RDW 15.2 15.0 15.7* 16.6*    Chemistries   Recent Labs Lab 07/30/16 0431  08/01/16 0401 08/02/16 0656 08/02/16 1549 08/04/16 0602 08/05/16 0436  NA 138  < > 137 137 136 139 141  K 3.6  < > 3.5 3.5 3.5 3.3* 3.4*  CL 107  < > 109 106 107 110 111  CO2 21*  < > 20* 22 23 22 23   GLUCOSE 116*  < > 96 91 91 97 93  BUN 86*  < > 50* 34* 32* 23* 20  CREATININE 7.90*  < > 4.08* 2.94* 2.78* 2.00* 1.74*  CALCIUM 9.1  < > 9.4 9.4 9.0 9.0 9.0  MG  --   --   --   --   --   --  1.7  AST 28  --   --  24  --   --   --   ALT 44  --   --  30  --   --   --   ALKPHOS 83  --   --  80  --   --   --   BILITOT 0.3  --   --  0.5  --   --   --   < > = values in this interval not displayed. ------------------------------------------------------------------------------------------------------------------ No results for input(s): CHOL, HDL, LDLCALC, TRIG, CHOLHDL, LDLDIRECT in the last 72 hours.  No results found for:  HGBA1C ------------------------------------------------------------------------------------------------------------------ No results for input(s): TSH, T4TOTAL, T3FREE, THYROIDAB in the last 72 hours.  Invalid input(s): FREET3 ------------------------------------------------------------------------------------------------------------------ No results for input(s): VITAMINB12, FOLATE, FERRITIN, TIBC, IRON, RETICCTPCT in the last 72 hours.  Coagulation profile  No results for input(s): INR, PROTIME in the last 168 hours.  No results for input(s): DDIMER in the last 72 hours.  Cardiac Enzymes No results for input(s): CKMB, TROPONINI, MYOGLOBIN in the last 168 hours.  Invalid input(s): CK ------------------------------------------------------------------------------------------------------------------ No results found for: BNP  Inpatient Medications  Scheduled Meds: . acetaminophen  650 mg Oral Once  . darbepoetin (ARANESP) injection - NON-DIALYSIS  150 mcg Subcutaneous Q Thu-1800  . diphenhydrAMINE  25 mg Oral Once  . docusate sodium  100 mg Oral BID  . feeding supplement (NEPRO CARB STEADY)  237 mL Oral BID BM  . ferrous sulfate  325 mg Oral BID WC  . methadone  10 mg Oral Daily  . neomycin-bacitracin-polymyxin   Topical BID  . predniSONE  40 mg Oral Q breakfast  . sodium chloride flush  10-40 mL Intracatheter Q12H   Continuous Infusions: . sodium chloride 125 mL/hr at 08/05/16 0307   PRN Meds:.acetaminophen, lidocaine (PF), lidocaine-prilocaine, morphine CONCENTRATE, opium-belladonna, pentafluoroprop-tetrafluoroeth  Micro Results No results found for this or any previous visit (from the past 240 hour(s)).  Radiology Reports US Renal  Result Date: 07/23/2016 CLINICAL DATA:  Subacute onset of renal insufficiency, and hematuria. Initial encounter. EXAM: RENAL / URINARY TRACT ULTRASOUND COMPLETE COMPARISON:  Renal ultrasound performed 07/09/2016 FINDINGS: Right Kidney: Length:  15.3 cm. Echogenicity within normal limits. No mass or hydronephrosis visualized. Left Kidney: Length: 14.9 cm. Mildly increased renal parenchymal echogenicity is noted. No mass or hydronephrosis visualized. Bladder: Decompressed, with a Foley catheter in place. IMPRESSION: 1. No evidence of hydronephrosis. 2. Mildly increased renal parenchymal echogenicity may reflect medical renal disease. 3. Bladder decompressed, with a Foley catheter in place. No significant blood clot seen within the bladder, though evaluation of the bladder is limited. Electronically Signed   By: Roanna Raider M.D.   On: 07/23/2016 18:42   US Renal  Result Date: 07/09/2016 CLINICAL DATA:  Acute kidney injury EXAM: RENAL / URINARY TRACT ULTRASOUND COMPLETE COMPARISON:  None. FINDINGS: Right Kidney: Length: 12.8 cm. Increased renal cortical echogenicity. No mass or hydronephrosis visualized. Left Kidney: Length: 12.9 cm. Increased renal cortical echogenicity. No mass or hydronephrosis visualized. Bladder: Decompressed bladder with a Foley catheter present. IMPRESSION: 1. No obstructive uropathy. 2. Increased renal cortical echogenicity as can be seen with acute renal failure. Electronically Signed   By: Elige Ko   On: 07/09/2016 12:03   Ir Fluoro Guide Cv Line Right  Result Date: 07/20/2016 INDICATION: Renal failure EXAM: TUNNELED DIALYSIS CATHETER PLACEMENT, ULTRASOUND GUIDANCE FOR VASCULAR ACCESS MEDICATIONS: Ancef; The antibiotic was administered within an appropriate time interval prior to skin puncture. ANESTHESIA/SEDATION: Versed 4 mg IV; Fentanyl 100 mcg IV; Moderate Sedation Time:  20 The patient was continuously monitored during the procedure by the interventional radiology nurse under my direct supervision. FLUOROSCOPY TIME:  Fluoroscopy Time:  minutes 42 seconds (3 mGy). COMPLICATIONS: None immediate. PROCEDURE: Informed written consent was obtained from the patient after a thorough discussion of the procedural risks,  benefits and alternatives. All questions were addressed. Maximal Sterile Barrier Technique was utilized including caps, mask, sterile gowns, sterile gloves, sterile drape, hand hygiene and skin antiseptic. A timeout was performed prior to the initiation of the procedure. The right neck was prepped with ChloraPrep in a sterile fashion, and a sterile drape was applied covering the operative field. A sterile gown and sterile gloves were used for the procedure. 1% lidocaine into the skin and subcutaneous tissue. The jugular vein was noted to be patent initially with  ultrasound. Under sonographic guidance, a micropuncture needle was inserted into the right IJ vein (Ultrasound and fluoroscopic image documentation was performed). It was removed over an 018 wire which was up-sized to an Amplatz. This was advanced into the IVC. A small incision was made in the right upper chest. The tunneling device was utilized to advance the 23 centimeter tip to cuff catheter from the chest incision and out the neck incision. A peel-away sheath was advanced over the Amplatz wire. The leading edge of the catheter was then advanced through the peel-away sheath. The peel-away sheath was removed. It was flushed and instilled with heparin. The chest incision was closed with a 0 Prolene pursestring stitch. The neck incision was closed with a 4-0 Vicryl subcuticular stitch. IMPRESSION: Successful right IJ vein tunneled dialysis catheter with its tip in the right atrium. Electronically Signed   By: Jolaine ClickArthur  Hoss M.D.   On: 07/20/2016 15:17   Ir Removal Tun Cv Cath W/o Fl  Result Date: 08/01/2016 INDICATION: 21 year old with history of acute kidney injury. Tunneled dialysis catheter is no longer needed. EXAM: REMOVAL TUNNELED CENTRAL VENOUS CATHETER MEDICATIONS: None ANESTHESIA/SEDATION: None FLUOROSCOPY TIME:  None COMPLICATIONS: None immediate. PROCEDURE: Informed written consent was obtained from the patient after a thorough discussion of the  procedural risks, benefits and alternatives. All questions were addressed. Retention suture was removed. Heparin was removed from the lumens. The catheter was easily removed with traction. Dressing placed over the skin exit site. IMPRESSION: Successful catheter removal as described above. Electronically Signed   By: Richarda OverlieAdam  Henn M.D.   On: 08/01/2016 11:16   Ir [place] Guide Vasc Access Right  Result Date: 07/20/2016 INDICATION: Renal failure EXAM: TUNNELED DIALYSIS CATHETER PLACEMENT, ULTRASOUND GUIDANCE FOR VASCULAR ACCESS MEDICATIONS: Ancef; The antibiotic was administered within an appropriate time interval prior to skin puncture. ANESTHESIA/SEDATION: Versed 4 mg IV; Fentanyl 100 mcg IV; Moderate Sedation Time:  20 The patient was continuously monitored during the procedure by the interventional radiology nurse under my direct supervision. FLUOROSCOPY TIME:  Fluoroscopy Time:  minutes 42 seconds (3 mGy). COMPLICATIONS: None immediate. PROCEDURE: Informed written consent was obtained from the patient after a thorough discussion of the procedural risks, benefits and alternatives. All questions were addressed. Maximal Sterile Barrier Technique was utilized including caps, mask, sterile gowns, sterile gloves, sterile drape, hand hygiene and skin antiseptic. A timeout was performed prior to the initiation of the procedure. The right neck was prepped with ChloraPrep in a sterile fashion, and a sterile drape was applied covering the operative field. A sterile gown and sterile gloves were used for the procedure. 1% lidocaine into the skin and subcutaneous tissue. The jugular vein was noted to be patent initially with ultrasound. Under sonographic guidance, a micropuncture needle was inserted into the right IJ vein (Ultrasound and fluoroscopic image documentation was performed). It was removed over an 018 wire which was up-sized to an Amplatz. This was advanced into the IVC. A small incision was made in the right upper  chest. The tunneling device was utilized to advance the 23 centimeter tip to cuff catheter from the chest incision and out the neck incision. A peel-away sheath was advanced over the Amplatz wire. The leading edge of the catheter was then advanced through the peel-away sheath. The peel-away sheath was removed. It was flushed and instilled with heparin. The chest incision was closed with a 0 Prolene pursestring stitch. The neck incision was closed with a 4-0 Vicryl subcuticular stitch. IMPRESSION: Successful right IJ vein tunneled dialysis  catheter with its tip in the right atrium. Electronically Signed   By: Jolaine Click M.D.   On: 07/20/2016 15:17   Dg Chest Port 1 View  Result Date: 07/14/2016 CLINICAL DATA:  Central line placement EXAM: PORTABLE CHEST 1 VIEW COMPARISON:  07/12/2016 FINDINGS: Endotracheal tube has been removed. Left-sided central venous catheter tip overlies the distal SVC. Right sided central venous catheter tip also overlies SVC. Esophageal tube has been removed. Mildly low lung volumes. No focal consolidation or effusion. Normal heart size. No pneumothorax. IMPRESSION: Removal of endotracheal and esophageal tubes. Tips of the central venous catheters overlie the SVC. Low lung volume but no pneumothorax or infiltrate. Electronically Signed   By: Jasmine Pang M.D.   On: 07/14/2016 00:21   Dg Chest Port 1 View  Result Date: 07/12/2016 CLINICAL DATA:  Acute respiratory failure EXAM: PORTABLE CHEST 1 VIEW COMPARISON:  07/11/2016 and prior radiographs FINDINGS: An endotracheal tube with tip 4 cm above the carina, NG tube entering stomach with tip off the field of view, right central venous catheter with tip overlying the mid SVC and left IJ central venous catheter with tip overlying the lower SVC again noted. This is a low volume film with mild bibasilar atelectasis again noted. There is no evidence of pneumothorax or definite pleural effusion. IMPRESSION: Unchanged appearance of the chest.  Electronically Signed   By: Harmon Pier M.D.   On: 07/12/2016 07:11   Dg Chest Port 1 View  Result Date: 07/11/2016 CLINICAL DATA:  Hypoxia EXAM: PORTABLE CHEST 1 VIEW COMPARISON:  July 10, 2016 FINDINGS: Endotracheal tube tip is 4.1 cm above the carina. The left jugular catheter tip is at the cavoatrial junction. Right jugular catheter tip is in the superior cava near the cavoatrial junction. Nasogastric tube tip and side port are below the diaphragm. No pneumothorax. There is mild atelectasis in the right base. Lungs elsewhere clear. Heart size and pulmonary vascularity are normal. No adenopathy. No bone lesions. IMPRESSION: Tube and catheter positions as described without evident pneumothorax. Right base atelectasis. Lungs elsewhere clear. Stable cardiac silhouette. Electronically Signed   By: Bretta Bang III M.D.   On: 07/11/2016 07:04   Dg Chest Port 1 View  Result Date: 07/10/2016 CLINICAL DATA:  Respiratory failure. EXAM: PORTABLE CHEST 1 VIEW COMPARISON:  07/09/2016 . FINDINGS: Endotracheal tube, NG tube, bilateral IJ lines in stable position. Heart size normal. Low lung volumes with mild basilar atelectasis. No change from prior exam. No pleural effusion or pneumothorax. IMPRESSION: 1. Lines and tubes stable position. 2. Low lung volumes with mild basilar atelectasis. No change from prior exam. Electronically Signed   By: Maisie Fus  Register   On: 07/10/2016 06:43   Dg Chest Port 1 View  Result Date: 07/09/2016 CLINICAL DATA:  Status post left catheter placement. EXAM: PORTABLE CHEST 1 VIEW COMPARISON:  Radiograph of July 09, 2016. FINDINGS: Stable cardiomediastinal silhouette. Endotracheal and orogastric tubes are unchanged in position. Right internal jugular catheter is unchanged. Mild bibasilar subsegmental atelectasis is now noted. Interval placement of left internal jugular dialysis catheter with distal tip in expected position of cavoatrial junction. No pneumothorax or pleural effusion is  noted. IMPRESSION: Mild bibasilar subsegmental atelectasis. Interval placement of left internal jugular dialysis catheter with distal tip in expected position of cavoatrial junction. No pneumothorax is noted. Electronically Signed   By: Lupita Raider, M.D.   On: 07/09/2016 12:28   Dg Chest Port 1 View  Result Date: 07/09/2016 CLINICAL DATA:  Endotracheal tube and  central line placement. EXAM: PORTABLE CHEST 1 VIEW COMPARISON:  None. FINDINGS: The heart size and mediastinal contours are within normal limits. No pneumothorax or pleural effusion is noted. Minimal right basilar subsegmental atelectasis is noted. Right internal jugular catheter is noted with distal tip in expected position of the SVC. Endotracheal tube is projected over tracheal air shadow with distal tip 6 cm above the carina. Orogastric tube tip is seen in proximal stomach. The visualized skeletal structures are unremarkable. IMPRESSION: Endotracheal tube, orogastric tube and right internal jugular catheter in grossly good position. No pneumothorax is noted. Minimal right basilar subsegmental atelectasis. Electronically Signed   By: Lupita Raider, M.D.   On: 07/09/2016 08:44   Time Spent in minutes  30  Deborah Lazcano M.D on 08/05/2016 at 11:50 AM  Between 7am to 7pm - Pager - [phone]  After 7pm go to [email or web address] - password [facility]  [facility] -  Office  [phone]

## 2016-08-05 NOTE — Progress Notes (Signed)
Occupational Therapy Treatment Patient Details Name: Usiel Astarita MRN: [id number] DOB: 1995/06/19 Today's Date: 08/05/2016    History of present illness Pt adm with drug overdose. UDS positive for opiates. Pt intubated 6/7 and self extubated 6/11. Pt with AKI and required CRRT which was stopped 6/14. To start HD on 6/16.  Pt pulled out foley with significant trauma. Also pulled out HD cath. No PMH.(per chart 10+ overdoses)   OT comments  Pt with improved activity tolerance and reports feeling stronger today following blood transfusion. Pt able to perform long distance functional mobility with supervision and RW. Pt currently overall supervision for ADL. Discussed plan for d/c with pt; he reports he plans to go home with his grandfather. Due to significant improvements in mobility and participation in ADL; updated d/c plan to no OT follow up. Will continue to follow acutely.   Follow Up Recommendations  No OT follow up;Supervision/Assistance - 24 hour    Equipment Recommendations  None recommended by OT    Recommendations for Other Services      Precautions / Restrictions Precautions Precaution Comments: Foley due to significant urethral trauma Restrictions Weight Bearing Restrictions: No       Mobility Bed Mobility Overal bed mobility: Modified Independent             General bed mobility comments: No assist required  Transfers Overall transfer level: Needs assistance Equipment used: Rolling walker (2 wheeled) Transfers: Sit to/from Stand Sit to Stand: Supervision         General transfer comment: Supervision for safety; no physical assist required. BP stable throughout, no c/o dizziness.    Balance Overall balance assessment: Needs assistance   Sitting balance-Leahy Scale: Normal       Standing balance-Leahy Scale: Fair                             ADL either performed or assessed with clinical judgement   ADL Overall ADL's : Needs  assistance/impaired                 Upper Body Dressing : Set up   Lower Body Dressing: Set up   Toilet Transfer: Supervision/safety;Ambulation;RW           Functional mobility during ADLs: Supervision/safety;Rolling walker General ADL Comments: Pt able to perform functional mobility from 6e down to outside of short stay on 2nd floor. Pt with improved activity tolerance and reports he is feeling stronger after blood transfusion.     Vision       Perception     Praxis      Cognition Arousal/Alertness: Awake/alert Behavior During Therapy: WFL for tasks assessed/performed Overall Cognitive Status: Within Functional Limits for tasks assessed                                          Exercises     Shoulder Instructions       General Comments      Pertinent Vitals/ Pain       Pain Assessment: Faces Faces Pain Scale: Hurts a little bit Pain Location: groin, penis Pain Descriptors / Indicators: Grimacing Pain Intervention(s): Monitored during session  Home Living  Prior Functioning/Environment              Frequency  Min 2X/week        Progress Toward Goals  OT Goals(current goals can now be found in the care plan section)  Progress towards OT goals: Progressing toward goals  Acute Rehab OT Goals Patient Stated Goal: go home tomorrow OT Goal Formulation: With patient  Plan Discharge plan needs to be updated    Co-evaluation    PT/OT/SLP Co-Evaluation/Treatment: Yes Reason for Co-Treatment: For patient/therapist safety   OT goals addressed during session: ADL's and self-care      AM-PAC PT "6 Clicks" Daily Activity     Outcome Measure   Help from another person eating meals?: None Help from another person taking care of personal grooming?: None Help from another person toileting, which includes using toliet, bedpan, or urinal?: A Little Help from another  person bathing (including washing, rinsing, drying)?: A Little Help from another person to put on and taking off regular upper body clothing?: None Help from another person to put on and taking off regular lower body clothing?: A Little 6 Click Score: 21    End of Session Equipment Utilized During Treatment: Gait belt;Rolling walker  OT Visit Diagnosis: Unsteadiness on feet (R26.81);Muscle weakness (generalized) (M62.81);Other symptoms and signs involving cognitive function   Activity Tolerance Patient tolerated treatment well   Patient Left in bed;with call bell/phone within reach   Nurse Communication Mobility status        Time: 1355-1443 OT Time Calculation (min): 48 min  Charges: OT General Charges $OT Visit: 1 Procedure OT Treatments $Self Care/Home Management : 8-22 mins  Chastity Noland A. Brett Albinooffey, M.S., OTR/L Pager: [phone]   Gaye AlkenBailey A Ashlie Mcmenamy 08/05/2016, 3:14 PM

## 2016-08-06 DIAGNOSIS — D62 Acute posthemorrhagic anemia: Secondary | ICD-10-CM

## 2016-08-06 DIAGNOSIS — J9600 Acute respiratory failure, unspecified whether with hypoxia or hypercapnia: Secondary | ICD-10-CM

## 2016-08-06 LAB — CBC
HCT: 27.4 % — ABNORMAL LOW (ref 39.0–52.0)
HEMOGLOBIN: 8.7 g/dL — AB (ref 13.0–17.0)
MCH: 29.5 pg (ref 26.0–34.0)
MCHC: 31.8 g/dL (ref 30.0–36.0)
MCV: 92.9 fL (ref 78.0–100.0)
PLATELETS: 240 10*3/uL (ref 150–400)
RBC: 2.95 MIL/uL — ABNORMAL LOW (ref 4.22–5.81)
RDW: 17.5 % — AB (ref 11.5–15.5)
WBC: 9.6 10*3/uL (ref 4.0–10.5)

## 2016-08-06 LAB — TYPE AND SCREEN
ABO/RH(D): O POS
ANTIBODY SCREEN: NEGATIVE
UNIT DIVISION: 0
UNIT DIVISION: 0
UNIT DIVISION: 0
Unit division: 0

## 2016-08-06 LAB — BASIC METABOLIC PANEL
Anion gap: 5 (ref 5–15)
BUN: 15 mg/dL (ref 6–20)
CALCIUM: 8.9 mg/dL (ref 8.9–10.3)
CHLORIDE: 114 mmol/L — AB (ref 101–111)
CO2: 24 mmol/L (ref 22–32)
CREATININE: 1.56 mg/dL — AB (ref 0.61–1.24)
Glucose, Bld: 90 mg/dL (ref 65–99)
Potassium: 3.4 mmol/L — ABNORMAL LOW (ref 3.5–5.1)
SODIUM: 143 mmol/L (ref 135–145)

## 2016-08-06 LAB — BPAM RBC
BLOOD PRODUCT EXPIRATION DATE: 201807272359
Blood Product Expiration Date: 201807272359
Blood Product Expiration Date: 201807282359
Blood Product Expiration Date: 201807282359
ISSUE DATE / TIME: 201807031253
ISSUE DATE / TIME: 201807041626
ISSUE DATE / TIME: 201807031003
ISSUE DATE / TIME: 201807041112
UNIT TYPE AND RH: 5100
UNIT TYPE AND RH: 5100
Unit Type and Rh: 5100
Unit Type and Rh: 5100

## 2016-08-06 LAB — MAGNESIUM: MAGNESIUM: 1.9 mg/dL (ref 1.7–2.4)

## 2016-08-06 MED ORDER — POTASSIUM CHLORIDE CRYS ER 20 MEQ PO TBCR
60.0000 meq | EXTENDED_RELEASE_TABLET | Freq: Once | ORAL | Status: AC
  Administered 2016-08-06: 60 meq via ORAL
  Filled 2016-08-06: qty 3

## 2016-08-06 MED ORDER — FERROUS SULFATE 325 (65 FE) MG PO TABS: 325.0000 mg | ORAL_TABLET | Freq: Every day | ORAL | 1 refills | Status: AC

## 2016-08-06 MED ORDER — DOCUSATE SODIUM 100 MG PO CAPS: 100.0000 mg | ORAL_CAPSULE | Freq: Two times a day (BID) | ORAL | 0 refills | Status: AC

## 2016-08-06 MED ORDER — PREDNISONE 10 MG PO TABS: ORAL_TABLET | ORAL | 0 refills | Status: AC

## 2016-08-06 MED ORDER — METHADONE HCL 10 MG PO TABS: 10.0000 mg | ORAL_TABLET | Freq: Every day | ORAL | 0 refills | Status: AC

## 2016-08-06 MED ORDER — ACETAMINOPHEN 325 MG PO TABS: 650.0000 mg | ORAL_TABLET | ORAL | Status: AC | PRN

## 2016-08-06 NOTE — Progress Notes (Signed)
Patient discharged to home with grandfather. IV removed. Foley bag transitioned to leg bag with teaching. Irrigation teaching given to patient. Patient expressed understanding. All scripts reviewed, and followup appts reviewed. Patients belongings with patient. Patient left unit in stable condition.  Blake LaineKimberly Derrika Ruffalo RN

## 2016-08-06 NOTE — Discharge Summary (Signed)
Physician Discharge Summary  Blake Kramer ZOX:[id number] DOB: Nov 20, 1995 DOA: 07/09/2016  PCP: Patient, No Pcp Per  Admit date: 07/09/2016 Discharge date: 08/06/2016  Admitted From: [facility]  Disposition: Home  Recommendations for Outpatient Follow-up:  1. Follow up with PCP in 1 weeks 2. Follow up with urologist in 2 weeks 3. Follow up with ENT in 2 weeks 4. Please obtain BMP/CBC in one week   Home Health: Physical therapy, RN, SW  Discharge Condition: Stable   CODE STATUS: Full    Brief Hospitalization Summary: Please see all hospital notes, images, labs for full details of the hospitalization.   HISTORY OF PRESENT ILLNESS:  Pt is encephelopathic; therefore, this HPI is obtained from chart review. Blake Kramer is a 21 y.o. man with no known PMH.  He was brought to [facility] ICU 6/7 from [place] after presumed drug overdose.  EMS was dispatched to pt's location where they found him lying supine on the floor, pale, diaphoretic.  Girlfriend informed EMS that she thought pt had might have been down for roughly 10 hours when she previously saw him.  Per police, there had been several cases of fentanyl and heroine overdoses in the community and per their report, scene looked very similar; therefore, they were suspecting the same in this instance.  He was given 3 rounds of narcan with little response.  In ED, he was intubated for airway protection.  Labs revealed multiple metabolic derangements including Lactate 10.4, SCr 4.4, K 7.1, AST 1453, ALT 1008, AG 23, WBC 18.9, Hgb 17.8, Trop 4.48.  UDS positive for opiates.  CT head negative.  He was then transferred to [facility] for further management.  Brief Narrative  Allegedly drug user and dealer [see psych MD note 6/20] Admit by PCCM from [place] with OD, Fentanyl/morphine Intubated for airway protection.  Noted Mild hypotension on admission, positive troponin, pinpoint pupils, hyperkalemia and oliguria Narcan unresponsive Lactate  10.4, SCr 4.4, K 7.1, AST 1453, ALT 1008, AG 23, WBC 18.9, Hgb 17.8, Trop 4.48. UDS positive for opiates Initially admitted and needed CRRT transitioning to intermittent dialysis Hospital course complicated by self discontinuation of Foley catheter causing urethral trauma necessitating urgent urological intervention and a coud catheter placement. His renal function has been improving with IV fluids.   Temporary HD catheter removal 6/30  Principal Problem:   Drug overdose Active Problems:   AKI (acute kidney injury)    Acute respiratory failure    Urethral bleeding   Acute renal failure (ARF)   Blood clot in bladder  Illicit Drug overdose - intent unclear  Pt back to baseline mental status Psychiatry has seen and cleared him from suicidality perspective  SW consulted, need rehab for substance abuse.  Pt looking into sober house living.   Pt was seen by psychiatrist and deemed not suicidal  Acute resp failure Resolved -due to OD   Urethral trauma  Repeat urethral trauma 6/19 probably secondary to heparinization in dialysis Patient self DC'd Foley initially 6/11 and needed urgent urological intervention and coud catheter placement At dilaysis on 6/19 experienced pain and found to be in pool of blood-this seems to have ceased Appreciate Dr. Berneice Heinrich input, felt 2/2 heparin c Dialysis?urine with clots 6/21, but seems to have resolved 6/25 US renal 6/10 rules out hydronephrosis as per Dr. Mena Goes, appreciate input  Initially plan was for at least 4 wksper urology for patient to have foley in place.  Pt will go home with foley and follow up with urology in 2 weeks.  Pt says he will follow up and his sister will help him manage foley at home.  Sister is an [employer] fundraiser.              Appreciate Dr. Elon Spanner input.  Pt to follow up with him outpatient.              Off  heparin              Dc heparin for dvt prophylaxis. scd ordered.               Anemia From hematuria ? If  Hgb <7 may need transfusion, he received multiple transfusions in hospital.  Hg 8.7 at discharge.  Repeat cbc in 1 week with PCP in [place].  started ferrous sulfate.   Acute oliguric kidney injury secondary to rhabdomyolysis on CRRT in the ICU not yet declared ESRD Orthostatic hypotension noted 6/25, ? 2/2 to ABLA vs vol depletion from dialysis uncertain whether he will need ongoing dialysis--Last dialysis 6/23, nephrology seems to think he may not need long-term dialysis which would be good given his drug history. He is now passing ~ 1,200 liter sof urine daily-atn in post-oliguric phase Appreciate nephrology input Await CM and Sw input Urine out put much improved. IR  removed temporary dialysis catheter 6/30  Shock Liver / Transaminitis Due to above - resolved   Acute blood loss due to urethral trauma See above Hemoglobin currently 8.2--->7.9--8.0--6.8 iron studies 6/21 showed iron of 36 and saturation of 11 receiving EPO/IV iron per nephro  Start iron ferrous sulfate daily Recheck CBC in 1 week with PCP.   Acute encephalopathy Resolved now due to intentionaldrug overdose + sedation(Heroin and Fentanyl) - methadone 10 mg daily Have discontinued psychotropics Patient cleared from this perspective of from psychiatry    R>L hearing loss Etiology unclear-Hypotension, ?Precedex? Propofol D/w Dr. 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 Patient now report some improvement while on prednisone.  Prednisone resume 7-02. Discharged on prednisone taper.  He will need OP follow up with ENT on d/c.  He will need to arrange this because he is going to live in [place] with family.    Hypokalemia; replete orally.   Inpatient;  Follow renal function.  PT evaluation.  SW to help with drug rehab. Pt working with sister  for sober living facility.    Discharge Diagnoses:  Principal Problem:   Drug overdose Active Problems:   AKI (acute kidney injury) (HCC)   Acute respiratory failure (HCC)   Urethral bleeding   Acute renal failure (ARF) (HCC)   Blood clot in bladder  Discharge Instructions: Discharge Instructions    Increase activity slowly    Complete by:  As directed      Allergies as of 08/06/2016      Reactions   Penicillins       Medication List    TAKE these medications   acetaminophen 325 MG tablet Commonly known as:  TYLENOL Take 2 tablets (650 mg total) by mouth every 4 (four) hours as needed for mild pain, moderate pain or headache.   docusate sodium 100 MG capsule Commonly known as:  COLACE Take 1 capsule (100 mg total) by mouth 2 (two) times daily.   ferrous sulfate 325 (65 FE) MG tablet Take 1 tablet (325 mg total) by mouth daily with breakfast.   methadone 10 MG tablet Commonly known as:  DOLOPHINE Take 1 tablet (10 mg total) by mouth daily. Start taking on:  08/07/2016   predniSONE 10 MG tablet Commonly known as:  DELTASONE Take 2 tabs daily with breakfast x 5 days, then 1 tab daily x 5 days then stop            Durable Medical Equipment        Start     Ordered   08/04/16 1457  For home use only DME Walker rolling  Once    Question:  Patient needs a walker to treat with the following condition  Answer:  Weakness   08/04/16 1457     Follow-up Information    [facility] - Dme Follow up.   Why:  for RW to be delivered to room prior to DC.  Contact information: [address] [place] [address] [phone]        Crist Fat, MD. Schedule an appointment as soon as possible for a visit in 2 week(s).   Specialty:  Urology Why:  Hospital Follow Up  Contact information: [address] [place] [address] [phone]        Serena Colonel, MD. Schedule an appointment as soon as possible for a visit in 2 week(s).    Specialty:  Otolaryngology Why:  Hospital Follow Up  Contact information: [address] [place] [address] [phone]          Allergies  Allergen Reactions  . Penicillins    Current Discharge Medication List    START taking these medications   Details  acetaminophen (TYLENOL) 325 MG tablet Take 2 tablets (650 mg total) by mouth every 4 (four) hours as needed for mild pain, moderate pain or headache.    docusate sodium (COLACE) 100 MG capsule Take 1 capsule (100 mg total) by mouth 2 (two) times daily. Qty: 100 capsule, Refills: 0    ferrous sulfate 325 (65 FE) MG tablet Take 1 tablet (325 mg total) by mouth daily with breakfast. Qty: 30 tablet, Refills: 1    methadone (DOLOPHINE) 10 MG tablet Take 1 tablet (10 mg total) by mouth daily. Qty: 20 tablet, Refills: 0    predniSONE (DELTASONE) 10 MG tablet Take 2 tabs daily with breakfast x 5 days, then 1 tab daily x 5 days then stop Qty: 15 tablet, Refills: 0        Procedures/Studies: US Renal  Result Date: 07/23/2016 CLINICAL DATA:  Subacute onset of renal insufficiency, and hematuria. Initial encounter. EXAM: RENAL / URINARY TRACT ULTRASOUND COMPLETE COMPARISON:  Renal ultrasound performed 07/09/2016 FINDINGS: Right Kidney: Length: 15.3 cm. Echogenicity within normal limits. No mass or hydronephrosis visualized. Left Kidney: Length: 14.9 cm. Mildly increased renal parenchymal echogenicity is noted. No mass or hydronephrosis visualized. Bladder: Decompressed, with a Foley catheter in place. IMPRESSION: 1. No evidence of hydronephrosis. 2. Mildly increased renal parenchymal echogenicity may reflect medical renal disease. 3. Bladder decompressed, with a Foley catheter in place. No significant blood clot seen within the bladder, though evaluation of the bladder is limited. Electronically Signed   By: Roanna Raider M.D.   On: 07/23/2016 18:42   US Renal  Result Date: 07/09/2016 CLINICAL DATA:  Acute kidney  injury EXAM: RENAL / URINARY TRACT ULTRASOUND COMPLETE COMPARISON:  None. FINDINGS: Right Kidney: Length: 12.8 cm. Increased renal cortical echogenicity. No mass or hydronephrosis visualized. Left Kidney: Length: 12.9 cm. Increased renal cortical echogenicity. No mass or hydronephrosis visualized. Bladder: Decompressed bladder with a  Foley catheter present. IMPRESSION: 1. No obstructive uropathy. 2. Increased renal cortical echogenicity as can be seen with acute renal failure. Electronically Signed   By: Elige Ko   On: 07/09/2016 12:03   Ir Fluoro Guide Cv Line Right  Result Date: 07/20/2016 INDICATION: Renal failure EXAM: TUNNELED DIALYSIS CATHETER PLACEMENT, ULTRASOUND GUIDANCE FOR VASCULAR ACCESS MEDICATIONS: Ancef; The antibiotic was administered within an appropriate time interval prior to skin puncture. ANESTHESIA/SEDATION: Versed 4 mg IV; Fentanyl 100 mcg IV; Moderate Sedation Time:  20 The patient was continuously monitored during the procedure by the interventional radiology nurse under my direct supervision. FLUOROSCOPY TIME:  Fluoroscopy Time:  minutes 42 seconds (3 mGy). COMPLICATIONS: None immediate. PROCEDURE: Informed written consent was obtained from the patient after a thorough discussion of the procedural risks, benefits and alternatives. All questions were addressed. Maximal Sterile Barrier Technique was utilized including caps, mask, sterile gowns, sterile gloves, sterile drape, hand hygiene and skin antiseptic. A timeout was performed prior to the initiation of the procedure. The right neck was prepped with ChloraPrep in a sterile fashion, and a sterile drape was applied covering the operative field. A sterile gown and sterile gloves were used for the procedure. 1% lidocaine into the skin and subcutaneous tissue. The jugular vein was noted to be patent initially with ultrasound. Under sonographic guidance, a micropuncture needle was inserted into the right IJ vein (Ultrasound and  fluoroscopic image documentation was performed). It was removed over an 018 wire which was up-sized to an Amplatz. This was advanced into the IVC. A small incision was made in the right upper chest. The tunneling device was utilized to advance the 23 centimeter tip to cuff catheter from the chest incision and out the neck incision. A peel-away sheath was advanced over the Amplatz wire. The leading edge of the catheter was then advanced through the peel-away sheath. The peel-away sheath was removed. It was flushed and instilled with heparin. The chest incision was closed with a 0 Prolene pursestring stitch. The neck incision was closed with a 4-0 Vicryl subcuticular stitch. IMPRESSION: Successful right IJ vein tunneled dialysis catheter with its tip in the right atrium. Electronically Signed   By: Jolaine Click M.D.   On: 07/20/2016 15:17   Ir Removal Tun Cv Cath W/o Fl  Result Date: 08/01/2016 INDICATION: 21 year old with history of acute kidney injury. Tunneled dialysis catheter is no longer needed. EXAM: REMOVAL TUNNELED CENTRAL VENOUS CATHETER MEDICATIONS: None ANESTHESIA/SEDATION: None FLUOROSCOPY TIME:  None COMPLICATIONS: None immediate. PROCEDURE: Informed written consent was obtained from the patient after a thorough discussion of the procedural risks, benefits and alternatives. All questions were addressed. Retention suture was removed. Heparin was removed from the lumens. The catheter was easily removed with traction. Dressing placed over the skin exit site. IMPRESSION: Successful catheter removal as described above. Electronically Signed   By: Richarda Overlie M.D.   On: 08/01/2016 11:16   Ir US Guide Vasc Access Right  Result Date: 07/20/2016 INDICATION: Renal failure EXAM: TUNNELED DIALYSIS CATHETER PLACEMENT, ULTRASOUND GUIDANCE FOR VASCULAR ACCESS MEDICATIONS: Ancef; The antibiotic was administered within an appropriate time interval prior to skin puncture. ANESTHESIA/SEDATION: Versed 4 mg IV;  Fentanyl 100 mcg IV; Moderate Sedation Time:  20 The patient was continuously monitored during the procedure by the interventional radiology nurse under my direct supervision. FLUOROSCOPY TIME:  Fluoroscopy Time:  minutes 42 seconds (3 mGy). COMPLICATIONS: None immediate. PROCEDURE: Informed written consent was obtained from the patient after a thorough discussion of the procedural risks,  benefits and alternatives. All questions were addressed. Maximal Sterile Barrier Technique was utilized including caps, mask, sterile gowns, sterile gloves, sterile drape, hand hygiene and skin antiseptic. A timeout was performed prior to the initiation of the procedure. The right neck was prepped with ChloraPrep in a sterile fashion, and a sterile drape was applied covering the operative field. A sterile gown and sterile gloves were used for the procedure. 1% lidocaine into the skin and subcutaneous tissue. The jugular vein was noted to be patent initially with ultrasound. Under sonographic guidance, a micropuncture needle was inserted into the right IJ vein (Ultrasound and fluoroscopic image documentation was performed). It was removed over an 018 wire which was up-sized to an Amplatz. This was advanced into the IVC. A small incision was made in the right upper chest. The tunneling device was utilized to advance the 23 centimeter tip to cuff catheter from the chest incision and out the neck incision. A peel-away sheath was advanced over the Amplatz wire. The leading edge of the catheter was then advanced through the peel-away sheath. The peel-away sheath was removed. It was flushed and instilled with heparin. The chest incision was closed with a 0 Prolene pursestring stitch. The neck incision was closed with a 4-0 Vicryl subcuticular stitch. IMPRESSION: Successful right IJ vein tunneled dialysis catheter with its tip in the right atrium. Electronically Signed   By: Jolaine Click M.D.   On: 07/20/2016 15:17   Dg Chest Port 1  View  Result Date: 07/14/2016 CLINICAL DATA:  Central line placement EXAM: PORTABLE CHEST 1 VIEW COMPARISON:  07/12/2016 FINDINGS: Endotracheal tube has been removed. Left-sided central venous catheter tip overlies the distal SVC. Right sided central venous catheter tip also overlies SVC. Esophageal tube has been removed. Mildly low lung volumes. No focal consolidation or effusion. Normal heart size. No pneumothorax. IMPRESSION: Removal of endotracheal and esophageal tubes. Tips of the central venous catheters overlie the SVC. Low lung volume but no pneumothorax or infiltrate. Electronically Signed   By: Jasmine Pang M.D.   On: 07/14/2016 00:21   Dg Chest Port 1 View  Result Date: 07/12/2016 CLINICAL DATA:  Acute respiratory failure EXAM: PORTABLE CHEST 1 VIEW COMPARISON:  07/11/2016 and prior radiographs FINDINGS: An endotracheal tube with tip 4 cm above the carina, NG tube entering stomach with tip off the field of view, right central venous catheter with tip overlying the mid SVC and left IJ central venous catheter with tip overlying the lower SVC again noted. This is a low volume film with mild bibasilar atelectasis again noted. There is no evidence of pneumothorax or definite pleural effusion. IMPRESSION: Unchanged appearance of the chest. Electronically Signed   By: Harmon Pier M.D.   On: 07/12/2016 07:11   Dg Chest Port 1 View  Result Date: 07/11/2016 CLINICAL DATA:  Hypoxia EXAM: PORTABLE CHEST 1 VIEW COMPARISON:  July 10, 2016 FINDINGS: Endotracheal tube tip is 4.1 cm above the carina. The left jugular catheter tip is at the cavoatrial junction. Right jugular catheter tip is in the superior cava near the cavoatrial junction. Nasogastric tube tip and side port are below the diaphragm. No pneumothorax. There is mild atelectasis in the right base. Lungs elsewhere clear. Heart size and pulmonary vascularity are normal. No adenopathy. No bone lesions. IMPRESSION: Tube and catheter positions as described  without evident pneumothorax. Right base atelectasis. Lungs elsewhere clear. Stable cardiac silhouette. Electronically Signed   By: Bretta Bang III M.D.   On: 07/11/2016 07:04   Dg  Chest Port 1 View  Result Date: 07/10/2016 CLINICAL DATA:  Respiratory failure. EXAM: PORTABLE CHEST 1 VIEW COMPARISON:  07/09/2016 . FINDINGS: Endotracheal tube, NG tube, bilateral IJ lines in stable position. Heart size normal. Low lung volumes with mild basilar atelectasis. No change from prior exam. No pleural effusion or pneumothorax. IMPRESSION: 1. Lines and tubes stable position. 2. Low lung volumes with mild basilar atelectasis. No change from prior exam. Electronically Signed   By: Maisie Fushomas  Register   On: 07/10/2016 06:43   Dg Chest Port 1 View  Result Date: 07/09/2016 CLINICAL DATA:  Status post left catheter placement. EXAM: PORTABLE CHEST 1 VIEW COMPARISON:  Radiograph of July 09, 2016. FINDINGS: Stable cardiomediastinal silhouette. Endotracheal and orogastric tubes are unchanged in position. Right internal jugular catheter is unchanged. Mild bibasilar subsegmental atelectasis is now noted. Interval placement of left internal jugular dialysis catheter with distal tip in expected position of cavoatrial junction. No pneumothorax or pleural effusion is noted. IMPRESSION: Mild bibasilar subsegmental atelectasis. Interval placement of left internal jugular dialysis catheter with distal tip in expected position of cavoatrial junction. No pneumothorax is noted. Electronically Signed   By: Lupita RaiderJames  Green Jr, M.D.   On: 07/09/2016 12:28   Dg Chest Port 1 View  Result Date: 07/09/2016 CLINICAL DATA:  Endotracheal tube and central line placement. EXAM: PORTABLE CHEST 1 VIEW COMPARISON:  None. FINDINGS: The heart size and mediastinal contours are within normal limits. No pneumothorax or pleural effusion is noted. Minimal right basilar subsegmental atelectasis is noted. Right internal jugular catheter is noted with distal tip in  expected position of the SVC. Endotracheal tube is projected over tracheal air shadow with distal tip 6 cm above the carina. Orogastric tube tip is seen in proximal stomach. The visualized skeletal structures are unremarkable. IMPRESSION: Endotracheal tube, orogastric tube and right internal jugular catheter in grossly good position. No pneumothorax is noted. Minimal right basilar subsegmental atelectasis. Electronically Signed   By: Lupita RaiderJames  Green Jr, M.D.   On: 07/09/2016 08:44     Subjective: Pt feeling much better, much less pain from foley catheter.  Less bleeding from catheter.    Discharge Exam: Vitals:   08/06/16 0600 08/06/16 0951  BP: (!) 111/57 (!) 120/56  Pulse: 70 63  Resp: 16 16  Temp: 98.2 F (36.8 C) 98.2 F (36.8 C)   Vitals:   08/05/16 1817 08/05/16 2055 08/06/16 0600 08/06/16 0951  BP: (!) 97/47 121/66 (!) 111/57 (!) 120/56  Pulse: 65 71 70 63  Resp: 18 16 16 16   Temp: 98.1 F (36.7 C) 98.3 F (36.8 C) 98.2 F (36.8 C) 98.2 F (36.8 C)  TempSrc: Oral Oral Oral Oral  SpO2: 100% 100% 99% 100%  Weight:  94.8 kg (209 lb)    Height:       General: Pt is alert, awake, not in acute distress Cardiovascular: RRR, S1/S2 +, no rubs, no gallops Respiratory: CTA bilaterally, no wheezing, no rhonchi Abdominal: Soft, NT, ND, bowel sounds + Extremities: no edema, no cyanosis GU: foley in place.  Minimal dry blood seen.    The results of significant diagnostics from this hospitalization (including imaging, microbiology, ancillary and laboratory) are listed below for reference.     Microbiology: No results found for this or any previous visit (from the past 240 hour(s)).   Labs: BNP (last 3 results) No results for input(s): BNP in the last 8760 hours. Basic Metabolic Panel:  Recent Labs Lab 08/02/16 0656 08/02/16 1549 08/04/16 0602 08/05/16 0436 08/06/16  0442  NA 137 136 139 141 143  K 3.5 3.5 3.3* 3.4* 3.4*  CL 106 107 110 111 114*  CO2 22 23 22 23 24    GLUCOSE 91 91 97 93 90  BUN 34* 32* 23* 20 15  CREATININE 2.94* 2.78* 2.00* 1.74* 1.56*  CALCIUM 9.4 9.0 9.0 9.0 8.9  MG  --   --   --  1.7 1.9  PHOS  --   --  2.9  --   --    Liver Function Tests:  Recent Labs Lab 08/02/16 0656 08/04/16 0602  AST 24  --   ALT 30  --   ALKPHOS 80  --   BILITOT 0.5  --   PROT 6.4*  --   ALBUMIN 3.2* 3.0*   No results for input(s): LIPASE, AMYLASE in the last 168 hours. No results for input(s): AMMONIA in the last 168 hours. CBC:  Recent Labs Lab 08/02/16 0656 08/04/16 0602 08/05/16 0436 08/06/16 0442  WBC 5.0 7.2 8.9 9.6  HGB 8.0* 6.8* 7.3* 8.7*  HCT 25.4* 21.9* 23.4* 27.4*  MCV 93.7 94.8 93.2 92.9  PLT 295 283 250 240   Cardiac Enzymes: No results for input(s): CKTOTAL, CKMB, CKMBINDEX, TROPONINI in the last 168 hours. BNP: Invalid input(s): POCBNP CBG: No results for input(s): GLUCAP in the last 168 hours. D-Dimer No results for input(s): DDIMER in the last 72 hours. Hgb A1c No results for input(s): HGBA1C in the last 72 hours. Lipid Profile No results for input(s): CHOL, HDL, LDLCALC, TRIG, CHOLHDL, LDLDIRECT in the last 72 hours. Thyroid function studies No results for input(s): TSH, T4TOTAL, T3FREE, THYROIDAB in the last 72 hours.  Invalid input(s): FREET3 Anemia work up No results for input(s): VITAMINB12, FOLATE, FERRITIN, TIBC, IRON, RETICCTPCT in the last 72 hours. Urinalysis No results found for: COLORURINE, APPEARANCEUR, LABSPEC, PHURINE, GLUCOSEU, HGBUR, BILIRUBINUR, KETONESUR, PROTEINUR, UROBILINOGEN, NITRITE, LEUKOCYTESUR Sepsis Labs Invalid input(s): PROCALCITONIN,  WBC,  LACTICIDVEN Microbiology No results found for this or any previous visit (from the past 240 hour(s)).  Time coordinating discharge: 40 mins  SIGNED:  Standley Dakins, MD  [facility] 08/06/2016, 3:32 PM Pager [phone]  If 7PM-7AM, please contact night-coverage [email or web address] Password [email or web address]

## 2016-08-06 NOTE — Progress Notes (Signed)
Referral not accepted by the [facility] agencies listed below. This is a comprehensive list for area patient lives in. Patient discharging with routine foley care, no flushes. Patient states he has been doing his own foley care. He also wants his sister to help him with foley care at home, he feels he doesn't really need a nurse. Spoke with sister who just graduated nursing school. She states she feels comfortable with routine foley care. Reviewed with her if urine output decreases, he has pain in lower abd, expresses clots with urine, or urine is bright red to take him to emergency room. Reviewed with her that he needs to establish a primary care doctor, or go to the health department for follow up care and labs, and that he soul go to urologist post dc, appointments on dc paperwork.  [facility]  [phone] Fax [phone] [email or web address]  [facility] [phone] Fax [phone] [email or web address]  [facility] [phone]   [facility]  [phone] Fax [phone]  [facility]  [address]  [address] [place], [place]  [id number]  [phone]   Josiah LoboSarah Blair, [facility] Coord. Fax [phone] [email or web address]  [facility] [phone] Fax [phone]  [facility] [phone] Fax [phone]  [facility] [phone] [email or web address]   [facility] PT only [phone] Fax [phone]

## 2017-12-03 DEATH — deceased

## 2018-04-09 IMAGING — US US RENAL
1 series · 14 of 25 positions shown · non-contrast
Comparison: Renal ultrasound performed 07/09/2016

CLINICAL DATA: Subacute onset of renal insufficiency, and
hematuria. Initial encounter.

EXAM:
RENAL / URINARY TRACT ULTRASOUND COMPLETE

[Series 1: us renal · 0.28mm/px · 14 of 27 slices shown]
[im 1/27]
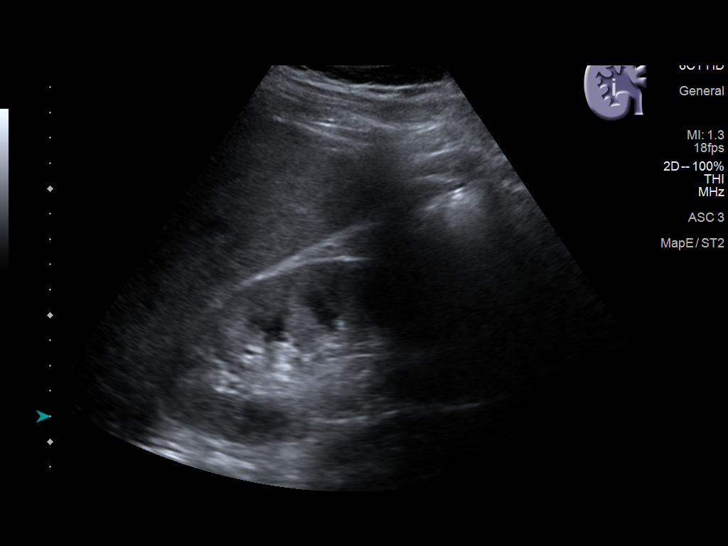
[im 3/27]
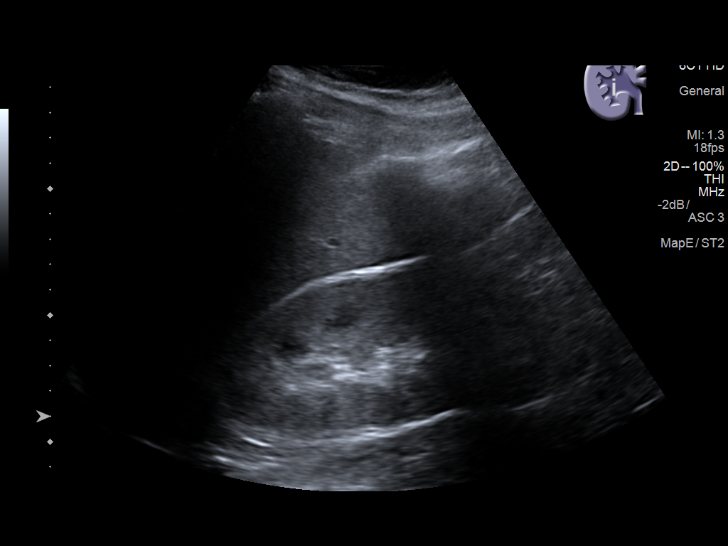
[im 5/27]
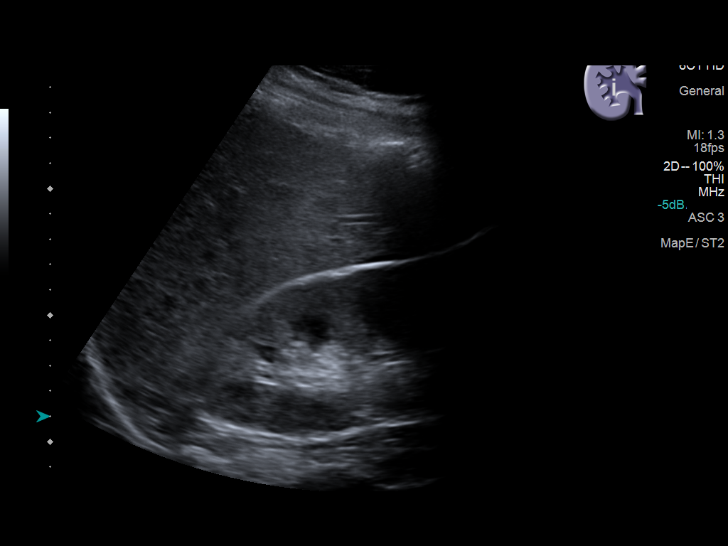
[im 7/27]
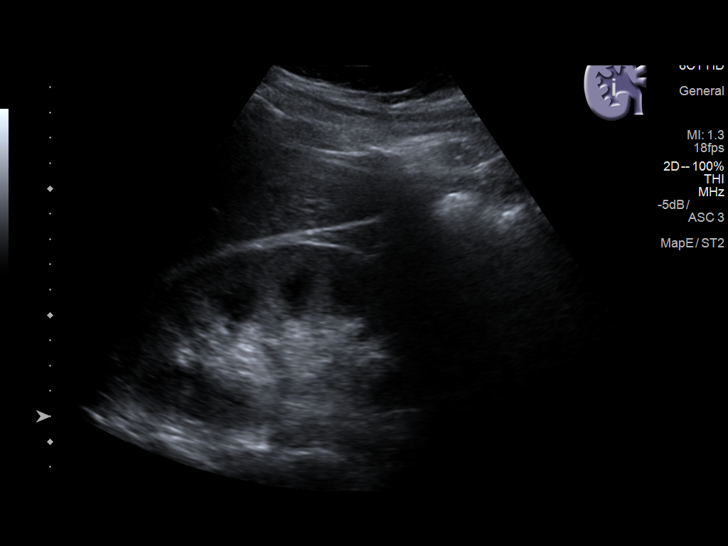
[im 9/27]
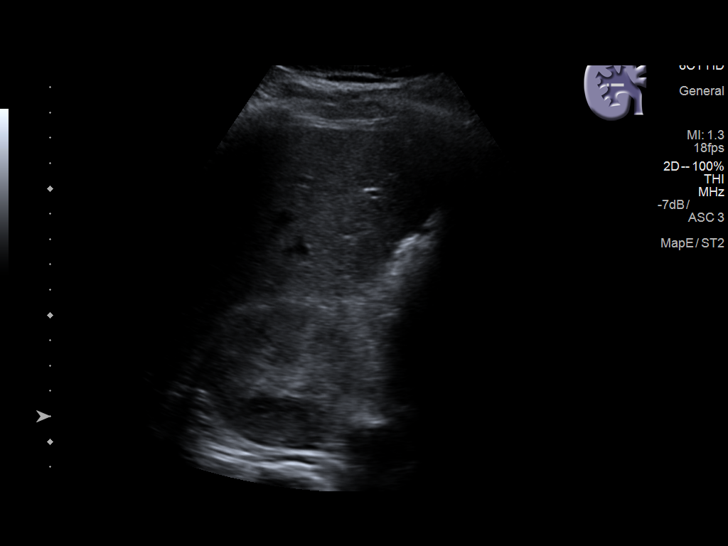
[im 10/27]
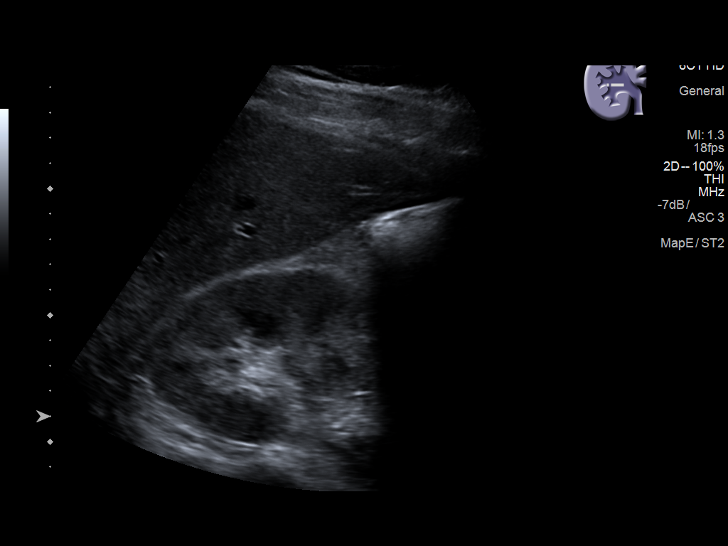
[im 12/27]
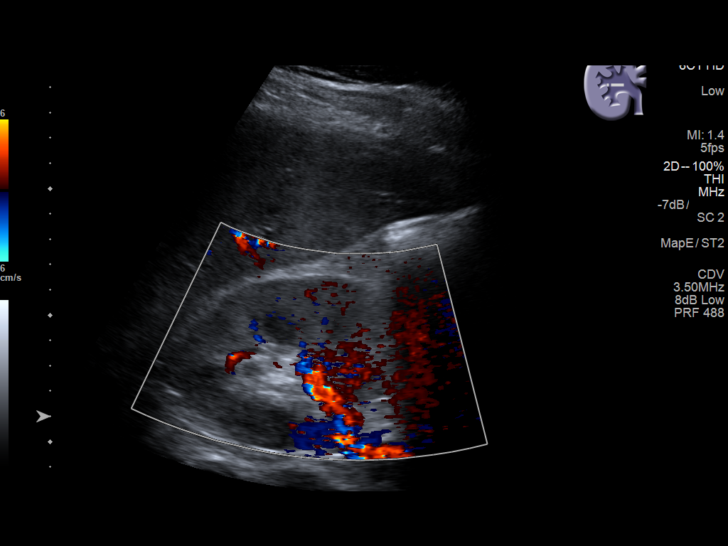
[im 15/27]
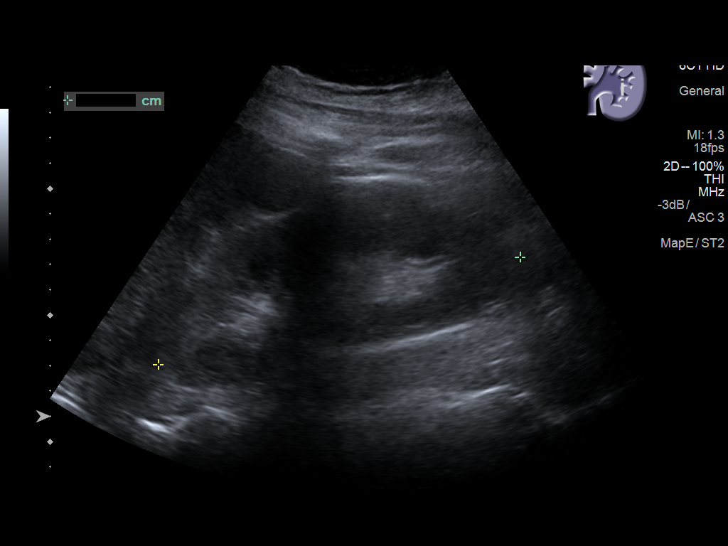
[im 17/27]
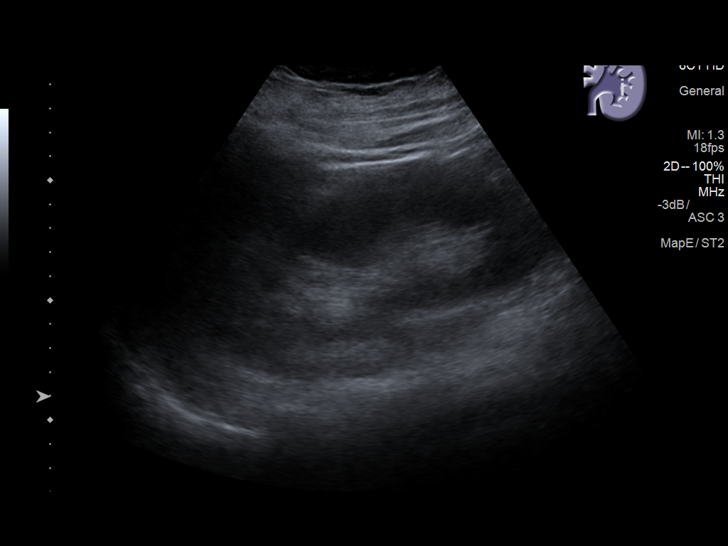
[im 18/27]
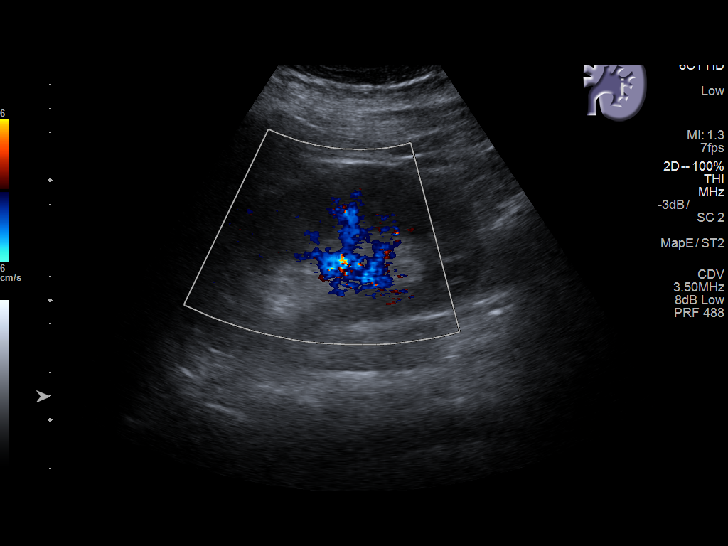
[im 20/27]
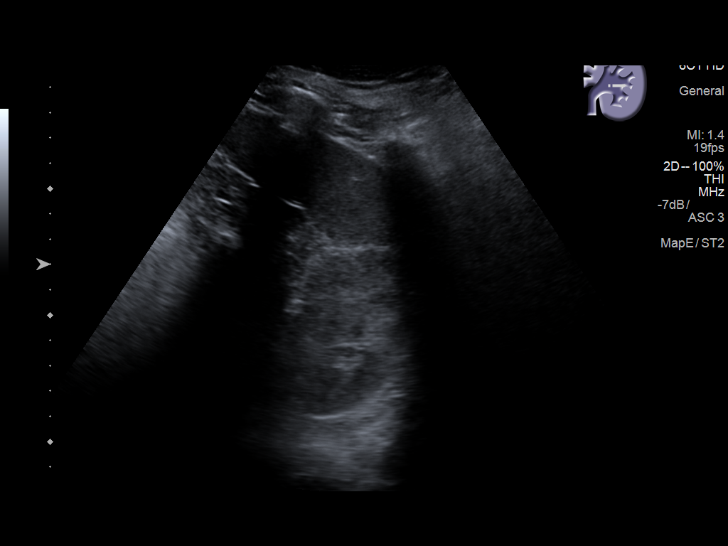
[im 22/27]
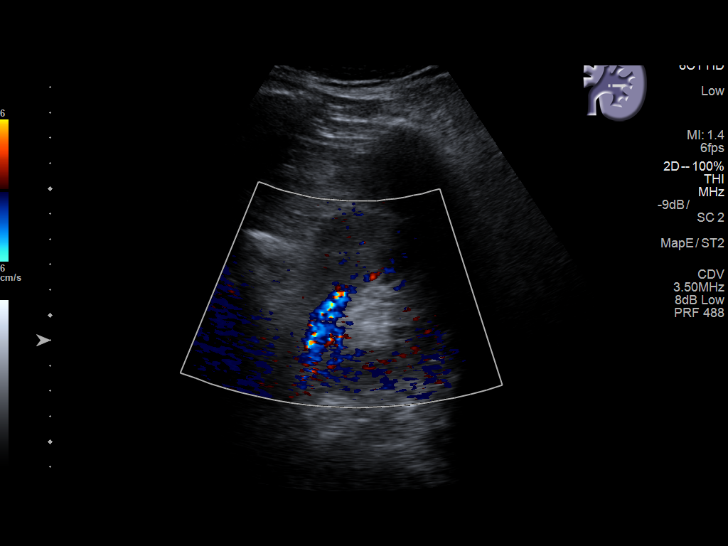
[im 24/27]
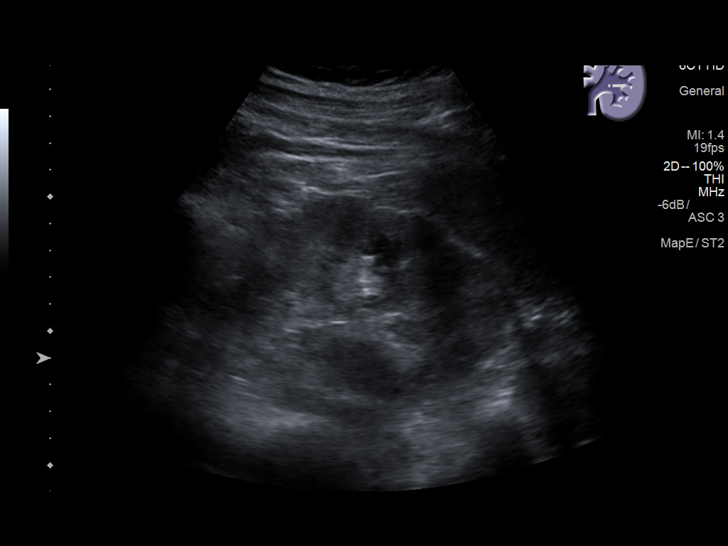
[im 27/27]
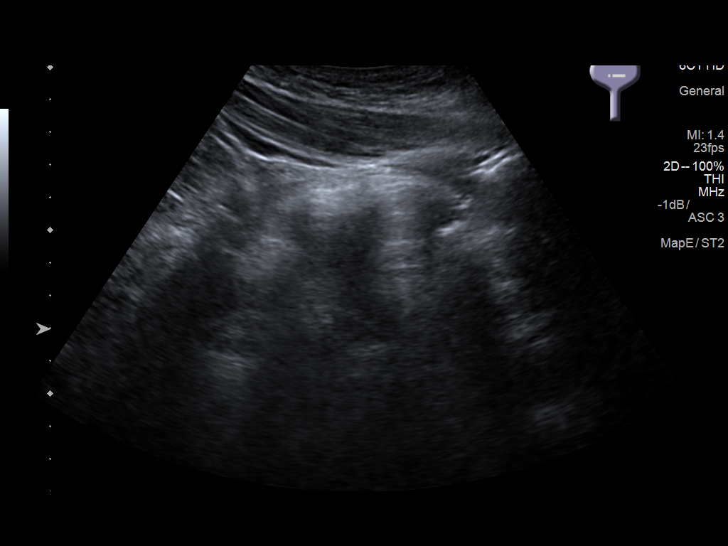

[14 of 25 positions shown; findings below may reference images not displayed]

FINDINGS: Right Kidney:

Length: 15.3 cm. Echogenicity within normal limits. No mass or
hydronephrosis visualized.

Left Kidney:

Length: 14.9 cm. Mildly increased renal parenchymal echogenicity is
noted. No mass or hydronephrosis visualized.

Bladder:

Decompressed, with a Foley catheter in place.
IMPRESSION: 1. No evidence of hydronephrosis.
2. Mildly increased renal parenchymal echogenicity may reflect
medical renal disease.
3. Bladder decompressed, with a Foley catheter in place. No
significant blood clot seen within the bladder, though evaluation of
the bladder is limited.

## 2018-05-11 IMAGING — CR DG CHEST 1V PORT
1 series · 1 of 1 positions shown · non-contrast
Comparison: July 10, 2016

CLINICAL DATA: Hypoxia

EXAM:
PORTABLE CHEST 1 VIEW

[AP]
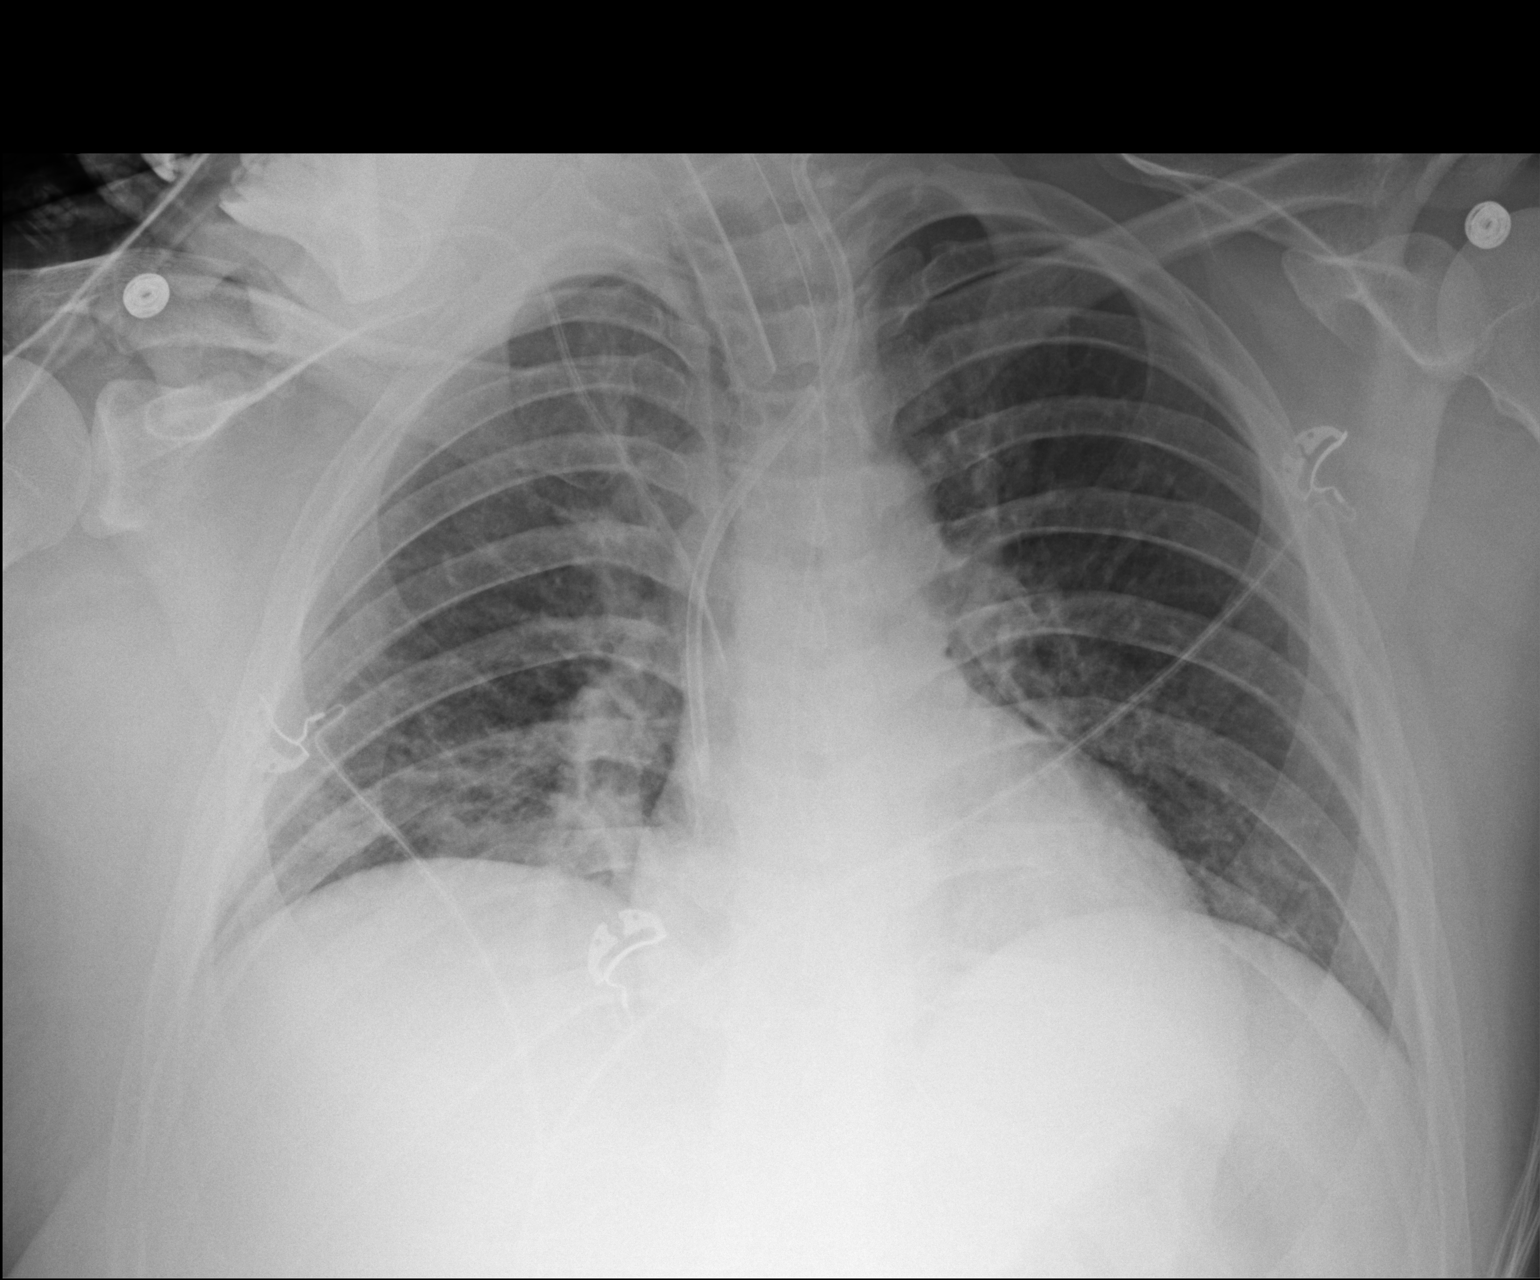

[1 of 1 positions shown; findings below may reference images not displayed]

FINDINGS: Endotracheal tube tip is 4.1 cm above the carina. The left jugular
catheter tip is at the cavoatrial junction. Right jugular catheter
tip is in the superior cava near the cavoatrial junction.
Nasogastric tube tip and side port are below the diaphragm. No
pneumothorax. There is mild atelectasis in the right base. Lungs
elsewhere clear. Heart size and pulmonary vascularity are normal. No
adenopathy. No bone lesions.
IMPRESSION: Tube and catheter positions as described without evident
pneumothorax. Right base atelectasis. Lungs elsewhere clear. Stable
cardiac silhouette.
